# Patient Record
Sex: Female | Born: 1940 | Race: White | Hispanic: No | Smoking: Current every day smoker
Health system: Southern US, Community
[De-identification: ages and names within clinical notes are randomized; demographics above are authoritative.]

## PROBLEM LIST (undated history)

## (undated) DIAGNOSIS — M549 Dorsalgia, unspecified: Secondary | ICD-10-CM

## (undated) DIAGNOSIS — Z8719 Personal history of other diseases of the digestive system: Secondary | ICD-10-CM

## (undated) DIAGNOSIS — J449 Chronic obstructive pulmonary disease, unspecified: Secondary | ICD-10-CM

## (undated) DIAGNOSIS — Z9289 Personal history of other medical treatment: Secondary | ICD-10-CM

## (undated) DIAGNOSIS — K219 Gastro-esophageal reflux disease without esophagitis: Secondary | ICD-10-CM

## (undated) DIAGNOSIS — R296 Repeated falls: Secondary | ICD-10-CM

## (undated) DIAGNOSIS — F32A Depression, unspecified: Secondary | ICD-10-CM

## (undated) DIAGNOSIS — M1990 Unspecified osteoarthritis, unspecified site: Secondary | ICD-10-CM

## (undated) DIAGNOSIS — I639 Cerebral infarction, unspecified: Secondary | ICD-10-CM

## (undated) DIAGNOSIS — J42 Unspecified chronic bronchitis: Secondary | ICD-10-CM

## (undated) DIAGNOSIS — N200 Calculus of kidney: Secondary | ICD-10-CM

## (undated) DIAGNOSIS — G8929 Other chronic pain: Secondary | ICD-10-CM

## (undated) DIAGNOSIS — F329 Major depressive disorder, single episode, unspecified: Secondary | ICD-10-CM

## (undated) HISTORY — PX: BACK SURGERY: SHX140

## (undated) HISTORY — PX: BILATERAL OOPHORECTOMY: SHX1221

## (undated) HISTORY — PX: KIDNEY STONE SURGERY: SHX686

## (undated) HISTORY — PX: HERNIA REPAIR: SHX51

## (undated) HISTORY — PX: GASTRIC BYPASS: SHX52

## (undated) HISTORY — PX: APPENDECTOMY: SHX54

## (undated) HISTORY — PX: CHOLECYSTECTOMY: SHX55

## (undated) HISTORY — PX: CATARACT EXTRACTION W/ INTRAOCULAR LENS  IMPLANT, BILATERAL: SHX1307

## (undated) HISTORY — PX: TONSILLECTOMY: SUR1361

## (undated) HISTORY — PX: EXCISIONAL HEMORRHOIDECTOMY: SHX1541

## (undated) HISTORY — PX: KNEE ARTHROSCOPY: SHX127

---

## 1997-12-14 ENCOUNTER — Ambulatory Visit (HOSPITAL_COMMUNITY): Admission: RE | Admit: 1997-12-14 | Discharge: 1997-12-14 | Payer: Self-pay | Admitting: Neurosurgery

## 1998-11-17 ENCOUNTER — Ambulatory Visit: Admission: RE | Admit: 1998-11-17 | Discharge: 1998-11-17 | Payer: Self-pay | Admitting: Pulmonary Disease

## 1998-12-02 ENCOUNTER — Encounter: Payer: Self-pay | Admitting: Thoracic Surgery

## 1998-12-03 ENCOUNTER — Inpatient Hospital Stay (HOSPITAL_COMMUNITY): Admission: RE | Admit: 1998-12-03 | Discharge: 1998-12-08 | Payer: Self-pay | Admitting: Thoracic Surgery

## 1998-12-04 ENCOUNTER — Encounter: Payer: Self-pay | Admitting: Thoracic Surgery

## 1998-12-06 ENCOUNTER — Encounter: Payer: Self-pay | Admitting: Thoracic Surgery

## 1998-12-07 ENCOUNTER — Encounter: Payer: Self-pay | Admitting: Thoracic Surgery

## 1998-12-08 ENCOUNTER — Encounter: Payer: Self-pay | Admitting: Thoracic Surgery

## 1999-09-08 ENCOUNTER — Encounter: Payer: Self-pay | Admitting: Neurosurgery

## 1999-09-08 ENCOUNTER — Ambulatory Visit (HOSPITAL_COMMUNITY): Admission: RE | Admit: 1999-09-08 | Discharge: 1999-09-09 | Payer: Self-pay | Admitting: Neurosurgery

## 1999-09-22 ENCOUNTER — Ambulatory Visit (HOSPITAL_COMMUNITY): Admission: RE | Admit: 1999-09-22 | Discharge: 1999-09-22 | Payer: Self-pay | Admitting: Neurosurgery

## 1999-09-22 ENCOUNTER — Encounter: Payer: Self-pay | Admitting: Neurosurgery

## 1999-10-06 ENCOUNTER — Ambulatory Visit (HOSPITAL_COMMUNITY): Admission: RE | Admit: 1999-10-06 | Discharge: 1999-10-06 | Payer: Self-pay | Admitting: Neurosurgery

## 1999-10-06 ENCOUNTER — Encounter: Payer: Self-pay | Admitting: Neurosurgery

## 1999-12-29 ENCOUNTER — Encounter: Payer: Self-pay | Admitting: Neurosurgery

## 2000-01-03 ENCOUNTER — Inpatient Hospital Stay (HOSPITAL_COMMUNITY): Admission: RE | Admit: 2000-01-03 | Discharge: 2000-01-11 | Payer: Self-pay | Admitting: Neurosurgery

## 2000-01-03 ENCOUNTER — Encounter: Payer: Self-pay | Admitting: Neurosurgery

## 2000-02-14 ENCOUNTER — Encounter: Admission: RE | Admit: 2000-02-14 | Discharge: 2000-02-14 | Payer: Self-pay | Admitting: Neurosurgery

## 2000-02-14 ENCOUNTER — Encounter: Payer: Self-pay | Admitting: Neurosurgery

## 2000-04-27 ENCOUNTER — Encounter: Admission: RE | Admit: 2000-04-27 | Discharge: 2000-04-27 | Payer: Self-pay | Admitting: Family Medicine

## 2000-05-08 ENCOUNTER — Encounter: Admission: RE | Admit: 2000-05-08 | Discharge: 2000-05-31 | Payer: Self-pay | Admitting: Neurosurgery

## 2000-05-09 ENCOUNTER — Other Ambulatory Visit: Admission: RE | Admit: 2000-05-09 | Discharge: 2000-05-09 | Payer: Self-pay | Admitting: Family Medicine

## 2000-05-10 ENCOUNTER — Encounter: Payer: Self-pay | Admitting: Family Medicine

## 2000-05-10 ENCOUNTER — Encounter: Admission: RE | Admit: 2000-05-10 | Discharge: 2000-05-10 | Payer: Self-pay | Admitting: Family Medicine

## 2001-11-22 ENCOUNTER — Encounter: Payer: Self-pay | Admitting: Anesthesiology

## 2001-11-25 ENCOUNTER — Encounter: Payer: Self-pay | Admitting: Podiatry

## 2001-11-25 ENCOUNTER — Ambulatory Visit (HOSPITAL_COMMUNITY): Admission: RE | Admit: 2001-11-25 | Discharge: 2001-11-25 | Payer: Self-pay | Admitting: Podiatry

## 2002-01-30 ENCOUNTER — Ambulatory Visit (HOSPITAL_COMMUNITY): Admission: RE | Admit: 2002-01-30 | Discharge: 2002-01-30 | Payer: Self-pay | Admitting: Podiatry

## 2003-06-24 ENCOUNTER — Ambulatory Visit (HOSPITAL_COMMUNITY): Admission: RE | Admit: 2003-06-24 | Discharge: 2003-06-24 | Payer: Self-pay | Admitting: Neurosurgery

## 2003-06-24 ENCOUNTER — Encounter: Payer: Self-pay | Admitting: Neurosurgery

## 2003-10-16 ENCOUNTER — Encounter: Admission: RE | Admit: 2003-10-16 | Discharge: 2003-10-16 | Payer: Self-pay | Admitting: Family Medicine

## 2003-12-08 ENCOUNTER — Other Ambulatory Visit: Admission: RE | Admit: 2003-12-08 | Discharge: 2003-12-08 | Payer: Self-pay | Admitting: Obstetrics and Gynecology

## 2004-04-23 ENCOUNTER — Ambulatory Visit (HOSPITAL_COMMUNITY): Admission: RE | Admit: 2004-04-23 | Discharge: 2004-04-23 | Payer: Self-pay | Admitting: Neurosurgery

## 2004-10-07 ENCOUNTER — Ambulatory Visit (HOSPITAL_COMMUNITY): Admission: RE | Admit: 2004-10-07 | Discharge: 2004-10-07 | Payer: Self-pay | Admitting: Neurosurgery

## 2005-03-03 ENCOUNTER — Encounter: Admission: RE | Admit: 2005-03-03 | Discharge: 2005-03-03 | Payer: Self-pay | Admitting: Orthopedic Surgery

## 2005-03-08 ENCOUNTER — Encounter: Admission: RE | Admit: 2005-03-08 | Discharge: 2005-03-08 | Payer: Self-pay | Admitting: Family Medicine

## 2005-05-09 ENCOUNTER — Ambulatory Visit (HOSPITAL_COMMUNITY): Admission: RE | Admit: 2005-05-09 | Discharge: 2005-05-09 | Payer: Self-pay | Admitting: Gastroenterology

## 2005-05-09 ENCOUNTER — Encounter (INDEPENDENT_AMBULATORY_CARE_PROVIDER_SITE_OTHER): Payer: Self-pay | Admitting: *Deleted

## 2005-05-23 ENCOUNTER — Encounter: Admission: RE | Admit: 2005-05-23 | Discharge: 2005-05-23 | Payer: Self-pay | Admitting: Neurosurgery

## 2005-07-05 ENCOUNTER — Encounter: Admission: RE | Admit: 2005-07-05 | Discharge: 2005-07-05 | Payer: Self-pay | Admitting: Neurosurgery

## 2005-07-19 ENCOUNTER — Encounter: Admission: RE | Admit: 2005-07-19 | Discharge: 2005-07-19 | Payer: Self-pay | Admitting: Neurosurgery

## 2005-08-30 ENCOUNTER — Ambulatory Visit (HOSPITAL_COMMUNITY): Admission: RE | Admit: 2005-08-30 | Discharge: 2005-08-30 | Payer: Self-pay | Admitting: Neurosurgery

## 2005-11-19 ENCOUNTER — Ambulatory Visit (HOSPITAL_COMMUNITY): Admission: RE | Admit: 2005-11-19 | Discharge: 2005-11-19 | Payer: Self-pay | Admitting: Neurosurgery

## 2006-07-09 ENCOUNTER — Emergency Department (HOSPITAL_COMMUNITY): Admission: EM | Admit: 2006-07-09 | Discharge: 2006-07-09 | Payer: Self-pay | Admitting: Emergency Medicine

## 2006-10-13 ENCOUNTER — Ambulatory Visit (HOSPITAL_COMMUNITY): Admission: RE | Admit: 2006-10-13 | Discharge: 2006-10-13 | Payer: Self-pay | Admitting: Neurosurgery

## 2006-11-13 ENCOUNTER — Encounter: Admission: RE | Admit: 2006-11-13 | Discharge: 2006-11-13 | Payer: Self-pay | Admitting: Family Medicine

## 2006-12-14 ENCOUNTER — Encounter: Payer: Self-pay | Admitting: Vascular Surgery

## 2006-12-14 ENCOUNTER — Ambulatory Visit: Payer: Self-pay | Admitting: Vascular Surgery

## 2006-12-16 ENCOUNTER — Ambulatory Visit (HOSPITAL_COMMUNITY): Admission: RE | Admit: 2006-12-16 | Discharge: 2006-12-16 | Payer: Self-pay | Admitting: Orthopedic Surgery

## 2007-04-27 ENCOUNTER — Emergency Department (HOSPITAL_COMMUNITY): Admission: EM | Admit: 2007-04-27 | Discharge: 2007-04-27 | Payer: Self-pay | Admitting: Emergency Medicine

## 2007-05-02 ENCOUNTER — Inpatient Hospital Stay (HOSPITAL_COMMUNITY): Admission: RE | Admit: 2007-05-02 | Discharge: 2007-05-07 | Payer: Self-pay | Admitting: Orthopedic Surgery

## 2007-05-03 ENCOUNTER — Ambulatory Visit: Payer: Self-pay | Admitting: Physical Medicine & Rehabilitation

## 2007-05-05 ENCOUNTER — Encounter (INDEPENDENT_AMBULATORY_CARE_PROVIDER_SITE_OTHER): Payer: Self-pay | Admitting: Orthopedic Surgery

## 2007-05-05 ENCOUNTER — Ambulatory Visit: Payer: Self-pay | Admitting: Vascular Surgery

## 2007-05-07 ENCOUNTER — Inpatient Hospital Stay: Admission: AD | Admit: 2007-05-07 | Discharge: 2007-05-21 | Payer: Self-pay | Admitting: Internal Medicine

## 2007-05-09 ENCOUNTER — Encounter: Admission: RE | Admit: 2007-05-09 | Discharge: 2007-05-09 | Payer: Self-pay | Admitting: Orthopedic Surgery

## 2007-05-11 ENCOUNTER — Ambulatory Visit (HOSPITAL_COMMUNITY): Admission: RE | Admit: 2007-05-11 | Discharge: 2007-05-11 | Payer: Self-pay | Admitting: Internal Medicine

## 2007-09-10 ENCOUNTER — Encounter: Admission: RE | Admit: 2007-09-10 | Discharge: 2007-09-10 | Payer: Self-pay | Admitting: Family Medicine

## 2007-12-10 ENCOUNTER — Encounter: Admission: RE | Admit: 2007-12-10 | Discharge: 2007-12-10 | Payer: Self-pay | Admitting: Family Medicine

## 2008-10-14 ENCOUNTER — Encounter: Admission: RE | Admit: 2008-10-14 | Discharge: 2008-10-14 | Payer: Self-pay | Admitting: Family Medicine

## 2008-11-23 ENCOUNTER — Encounter: Admission: RE | Admit: 2008-11-23 | Discharge: 2008-11-23 | Payer: Self-pay | Admitting: Neurosurgery

## 2009-02-16 ENCOUNTER — Encounter: Admission: RE | Admit: 2009-02-16 | Discharge: 2009-02-16 | Payer: Self-pay | Admitting: Neurosurgery

## 2009-02-24 ENCOUNTER — Encounter: Admission: RE | Admit: 2009-02-24 | Discharge: 2009-02-24 | Payer: Self-pay | Admitting: Family Medicine

## 2009-06-15 ENCOUNTER — Encounter: Admission: RE | Admit: 2009-06-15 | Discharge: 2009-06-15 | Payer: Self-pay | Admitting: Neurosurgery

## 2009-08-24 ENCOUNTER — Encounter: Admission: RE | Admit: 2009-08-24 | Discharge: 2009-08-24 | Payer: Self-pay | Admitting: Neurosurgery

## 2009-11-24 ENCOUNTER — Encounter: Admission: RE | Admit: 2009-11-24 | Discharge: 2009-11-24 | Payer: Self-pay | Admitting: Neurosurgery

## 2010-01-25 ENCOUNTER — Inpatient Hospital Stay (HOSPITAL_COMMUNITY): Admission: RE | Admit: 2010-01-25 | Discharge: 2010-01-29 | Payer: Self-pay | Admitting: Neurosurgery

## 2010-02-07 ENCOUNTER — Encounter: Payer: Self-pay | Admitting: Family Medicine

## 2010-03-23 ENCOUNTER — Encounter: Admission: RE | Admit: 2010-03-23 | Discharge: 2010-03-23 | Payer: Self-pay | Admitting: Family Medicine

## 2010-09-25 ENCOUNTER — Encounter: Payer: Self-pay | Admitting: Neurosurgery

## 2010-10-04 NOTE — Letter (Signed)
Summary: [facility]  [facility]   Imported By: Maryln Gottron 02/23/2010 13:07:43  _____________________________________________________________________  External Attachment:    Type:   Image     Comment:   External Document

## 2010-11-21 LAB — DIFFERENTIAL
Basophils Absolute: 0 10*3/uL (ref 0.0–0.1)
Basophils Relative: 0 % (ref 0–1)
Eosinophils Absolute: 0.4 10*3/uL (ref 0.0–0.7)
Eosinophils Relative: 5 % (ref 0–5)
Lymphocytes Relative: 37 % (ref 12–46)
Lymphs Abs: 3.2 10*3/uL (ref 0.7–4.0)
Monocytes Absolute: 0.5 10*3/uL (ref 0.1–1.0)
Monocytes Relative: 6 % (ref 3–12)
Neutro Abs: 4.4 10*3/uL (ref 1.7–7.7)
Neutrophils Relative %: 51 % (ref 43–77)

## 2010-11-21 LAB — PROTIME-INR
INR: 0.94 (ref 0.00–1.49)
Prothrombin Time: 12.5 seconds (ref 11.6–15.2)

## 2010-11-21 LAB — MRSA PCR SCREENING

## 2010-11-21 LAB — COMPREHENSIVE METABOLIC PANEL
ALT: 26 U/L (ref 0–35)
AST: 34 U/L (ref 0–37)
Albumin: 3.8 g/dL (ref 3.5–5.2)
Alkaline Phosphatase: 73 U/L (ref 39–117)
BUN: 9 mg/dL (ref 6–23)
CO2: 24 mEq/L (ref 19–32)
Calcium: 9.1 mg/dL (ref 8.4–10.5)
Chloride: 107 mEq/L (ref 96–112)
Creatinine, Ser: 0.51 mg/dL (ref 0.4–1.2)
GFR calc non Af Amer: >60 mL/min (ref >60)
Glucose, Bld: 95 mg/dL (ref 70–99)
Potassium: 4.4 mEq/L (ref 3.5–5.1)
Sodium: 136 mEq/L (ref 135–145)
Total Bilirubin: 0.5 mg/dL (ref 0.3–1.2)
Total Protein: 6.4 g/dL (ref 6.0–8.3)

## 2010-11-21 LAB — SURGICAL PCR SCREEN: MRSA, PCR: NEGATIVE

## 2010-11-21 LAB — URINALYSIS, ROUTINE W REFLEX MICROSCOPIC
Bilirubin Urine: NEGATIVE
Glucose, UA: NEGATIVE mg/dL
Hgb urine dipstick: NEGATIVE
Ketones, ur: NEGATIVE mg/dL
Nitrite: NEGATIVE
Protein, ur: NEGATIVE mg/dL
Specific Gravity, Urine: 1.02 (ref 1.005–1.030)
Urobilinogen, UA: 0.2 mg/dL (ref 0.0–1.0)
pH: 5.5 (ref 5.0–8.0)

## 2010-11-21 LAB — APTT: aPTT: 25 seconds (ref 24–37)

## 2010-11-21 LAB — CBC
HCT: 38.4 % (ref 36.0–46.0)
Hemoglobin: 13 g/dL (ref 12.0–15.0)
MCHC: 33.9 g/dL (ref 30.0–36.0)
MCV: 97.2 fL (ref 78.0–100.0)
Platelets: 199 10*3/uL (ref 150–400)
RBC: 3.95 MIL/uL (ref 3.87–5.11)
RDW: 13.1 % (ref 11.5–15.5)
WBC: 8.6 10*3/uL (ref 4.0–10.5)

## 2010-11-21 LAB — TYPE AND SCREEN
ABO/RH(D): O POS
Antibody Screen: NEGATIVE

## 2010-11-21 LAB — ABO/RH: ABO/RH(D): O POS

## 2011-01-17 NOTE — Discharge Summary (Signed)
NAME:  Meagan Holt, Meagan Holt NO.:  [id number]   MEDICAL RECORD NO.:  [id number]          PATIENT TYPE:  INP   LOCATION:  1614                         FACILITY:  [facility]   PHYSICIAN:  Georges Lynch. Gioffre, M.D.DATE OF BIRTH:  01-May-1941   DATE OF ADMISSION:  05/02/2007  DATE OF DISCHARGE:  05/07/2007                               DISCHARGE SUMMARY   ADMISSION DIAGNOSES:  1. End-stage osteoarthritis, left knee.  2. History of chronic bronchitis, right lung lobectomy.  3. Hypertension with a recent normal stress test.  4. History of hiatal hernia, bleeding ulcers.  5. History of multiple kidney stones.  6. History of peripheral neuropathies.  7. Obesity.   DISCHARGE DIAGNOSES:  1. Left total knee arthroplasty with slow postoperative progression.  2. Postoperative temperatures, etiology unknown, possible early      pneumonia, improved on Avelox.  3. Postoperative urinary retention, improved with Urecholine.  4. Postoperative slow progression with therapy.  5. Postoperative acute blood loss anemia tolerated well without blood      transfusion.  6. History of chronic bronchitis with right lung lobectomy.  7. Hypertension, recent normal stress test.  8. History of hiatal hernia, bleeding ulcers.  9. History of multiple kidney stones.  10.History of peripheral neuropathies.  11.Obesity.  12.History of tobacco use.   SURGICAL PROCEDURE:  On 05/02/2007, the patient was taken to the O.R. by  Dr. Ranee Gosselin, assisted by Oneida Alar, P.A.-C.  The patient underwent  left total knee arthroplasty with a Dupuy rotating platform system  without any complications.  The patient tolerated the procedure well.  There were no complications.  The patient was transferred to the  recovery room and then to the Orthopedic Floor in good condition  following routine total knee protocol, IV antibiotics, pain medicines  and DVT prophylaxis.  The patient had the following components  implanted.   A size 4 left femoral component, a size 4 keel tibial tray,  size 4, 12.5 polyethylene bearing, a size 38-mm 3-peg patella.  All  components were implanted with polymethyl methacrylate and vancomycin  mixed in.   CONSULTS:  The following routine consults were required.  Physical  Therapy, Case Management, Physical Therapy, Pharmacy for Coumadin  dosing.   An [facility] Hospitalist Consult was also requested for postoperative  temperature.   HOSPITAL COURSE:  On May 02, 2007, patient was admitted to [facility] under the care of Dr. Darrelyn Hillock.  The patient was taken to  the O.R. where a left total knee arthroplasty was performed.  The  patient tolerated the procedure well.  There were no complications.  The  patient was transferred to the remainder and then to the Orthopedic  Floor in good condition.  The patient was able to follow a total knee  protocol.  She was also weaned off of IV antibiotics, pain medicines,  the p.o. meds.  She tolerated them well.  The patient did develop some  postoperative temperatures with temperatures in the 104.3 range.  She  also had some shortness of breath.  She was worked up and evaluated by  [employer]  Hospitalist.  She was placed on Avelox.  Chest x-rays were  performed.  Chest x-ray on August 30 showed improved interval aeration  of the lung bases, small right pleural effusions, maybe slightly larger  than previous chest x-ray, which showed large interstitial markings,  most consistent with edema.  The patient's temperature did resolve over  the next course of 24-48 hours.  She continued to improve without any  signs of an other signs of infection.  She had no other further  shortness of breath.  Patient was having difficulty timed voiding after  DC'g her Foley catheter so she was placed on Urecholine with some  improvement.  The patient did develop some postoperative blood loss  anemia.  Patient's vital signs remained stable.  She  tolerated it well,  so no blood transfusion was performed.  Patient did complain of pain and  difficulty with any attempts at physical therapy, and she made slow  progress with the therapy and CPM.   Patient's left knee did have a typical diffuse violaceous and erythremic  appearance.  It is felt to be related to the Coumadin and the  postoperative bleeding and to the knee, but she will be placed on Keflex  prophylactically in the face of the intermittent temperatures  throughout.  No gross signs of infection were noted.  Patient's INR was  2.1.  Urine cultures showed no growth.  Blood cultures showed no growth  associated.  No specific source of infection.   It was felt that on postop day number 5, she was Orthopedically and  medically stable from Dr. Jeannetta Ellis standpoint for discharge to skilled  nursing facility, so arrangements were made and she will be discharged  when a bed became available.   LABS:  Blood cultures drawn on August 29 had no growth to date.  Urine  culture drawn on August 29 shows no growth, final.  CBC on September 2  shows WBC 7.6, hemoglobin 8.6, hematocrit 25.8, platelets 305.  BMET  drawn on September 2 shows sodium of 137, potassium of 3.9, glucose 94,  BUN 5, creatinine 0.62 with an EGFR of greater than 60.  PT was 24.2  with an INR of 2.1.  Chest x-ray, preop, shows scarring of right with  small pleural effusion versus pleural scarring.  Chest x-ray done on  August 29 shows enlargement of the interstitial markings mostly  consistent with edema.  Chest x-ray on August 30 shows improved  interval, improved aeration of lung bases, small right pleural effusion,  maybe slightly larger.  EKG on April 09, 2007 shows sinus tachycardia at  114.   DISCHARGE INSTRUCTIONS:  1. Diet.  No restrictions.  2. Activity.  Patient is to be weight bearing as tolerated, increased      activity on a daily basis with the use of walker.  Patient is to be      out of bed with  physical therapy on a daily basis.  Patient is to      be in a CPM at least 6 hours a day, 0-40 degrees to start,      increasing by 10 degrees a day.  3. Wound care.  Patient should have her dressing changed on a daily      basis.  Staples are to remove on postop day number 14.  Today is      day number 5.  Please apply Steri-Strips after staples are removed.   MEDICATIONS:  1. Neurontin 600 mg p.o. t.i.d.  2. Colace 100 mg p.o. b.i.d.  3. Ferrous sulfate 325 mg p.o. t.i.d.  4. Combivent inhaler t.i.d.  5. Urecholine 25 mg p.o. q.i.d. until urinating freely.  6. Nystatin cream to be applied to affected area 3 times a day.  7. Avapro 150 mg daily, but to hold for blood pressures less than a      systolic of 110 or diastolic less than 50.  8. Albuterol 90 mcg inhaler 2 puffs every 6 hours p.r.n.  9. Flexeril 10 mg every 12 hours.  10.Percocet 5 mg 1 or 2 tablets every 4-6 hours p.r.n.  11.Tylenol 650 p.o. q.4 hours p.r.n.  12.Phenergan 25 mg p.o. q.6 hours p.r.n.  13.Robaxin 500 mg p.o. q.6 hours p.r.n.  14.Ambien 5 mg p.o. nightly p.r.n.  15.Coumadin 7.5 mg p.o. q.day to maintain an INR of 2-2.5.  Pharmacy      to adjust accordingly.  16.Avelox 400 mg p.o. q.day for 2 more days.  17.Keflex 500 mg p.o. q.i.d. for 7 days.   Follow-up appointment with Dr. Darrelyn Hillock 2 weeks from discharge from  [facility].  Please call [phone] for follow-up appointment.   Patient's condition upon discharge, skilled nursing facility is  improved.      Jamelle Rushing, P.A.    ______________________________  Georges Lynch Darrelyn Hillock, M.D.    RWK/MEDQ  D:  05/07/2007  T:  05/07/2007  Job:  [id number]

## 2011-01-17 NOTE — H&P (Signed)
NAME:  Meagan Holt, BIDWELL NO.:  [id number]   MEDICAL RECORD NO.:  [id number]         PATIENT TYPE:  LINP   LOCATION:                               FACILITY:  [facility]   PHYSICIAN:  Georges Lynch. Gioffre, M.D.DATE OF BIRTH:  03/17/1941   DATE OF ADMISSION:  05/02/2007  DATE OF DISCHARGE:                              HISTORY & PHYSICAL   CHIEF COMPLAINT:  Severe pain in the left knee.   HISTORY OF PRESENT ILLNESS:  Ms. Meagan Holt is a 70 year old female patient  of Dr. Ranee Gosselin with chronic left knee pain.  The patient has  failed conservative treatment.  She has pain with range of motion and  ambulation.  She has noted some valgus deformity of the knee.  X-ray  shows that she is bone-on-bone lateral compartment with severe  osteoarthritis.  The patient has elected to proceed with a total knee  arthroplasty.   PAST MEDICAL HISTORY:  1. 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  2. Tobacco use.  3. 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  4. Hypertension.  5. Recent stress test 3 years previous, normal.  6. History of hiatal hernia.  7. History of bleeding ulcers 1 year previous with blood transfusion.  8. History of multiple kidney stones.  9. History of peripheral neuropathy status post lumbar surgery.  10.Obesity.   ALLERGIES:  1. LATEX.  2. DOXYCYCLINE.   MEDICATIONS:  1. Diovan/HCT 160/12.5 mg a day.  2. Neurontin 300 mg 2 tablets three times a day.  3. Flexeril 10 mg p.r.n.  4. Triamcinolone 0.25 mg p.r.n.  5. Albuterol 90 mcg 2 puffs p.r.n.  6. Percocet 5 mg 1-2 tablets every 6 hours p.r.n.  7. Fish oil.  8. Ocuvite.  9. Lutein.   PAST SURGICAL HISTORY:  1. Tonsillectomy in 1946.  2. Appendectomy in 1954.  3. Cholecystectomy in 1974.  4. IJ bypass in 1976 with a reversal and 1981 and stomach stapling.  5. Multiple kidney stones in 1978.  6. Lumbar surgery in 2000.  7. Right lung lobectomy in 2005.  The  patient denies any complications of any of the above-mentioned  surgical procedures.   REVIEW OF SYSTEMS:  Negative for any neurologic issues other than the  peripheral neuropathy which resulted after lumbar surgery.  Chronic  bronchitis due to smoking since 2005; intermittent nebulizer use.  No  shortness of breath secondary to previous lobectomy.  A recent stress  test 3 years previous due to chest pain was found to be related to a  hiatal hernia.  Normal stress test.  Bleeding ulcers 1 year previous  with blood transfusions.  Multiple kidney stones in 1978 after IJ  bypass.  No recent urinary tract infections or incontinence.  She denies  anything related to any endocrine issues.  No thyroid issues or  diabetes.  Blood transfusion secondary to bleeding ulcer 1 year  previous.  No other blood clots or cancers.   FAMILY MEDICAL HISTORY:  Mother is deceased at age 7; no medical  issues.  Father is deceased  in his 75s room secondary to stomach cancer.  Sisters have heart disease and hypertension.  Grandfather was deceased  from a stroke.   SOCIAL HISTORY:  Patient is a widow.  Retired Airline pilot.  She smokes  about a pack a day.  She drinks alcohol socially.  She lives in a one-  story house with a handicap ramp.  The patient will not have help  postoperatively, and will probably need a skilled nursing facility.   PHYSICAL EXAMINATION:  VITAL SIGNS:  Height is 5 feet, 8 inches.  Weight  is 240.  Blood pressure is 128/74, pulse of 74 and regular, respirations  are 14 and nonlabored.  The patient is afebrile.  HEENT:  Head was normocephalic.  Pupils good range of motion.  Gross  hearing is intact.  Oral buccal mucosa was pink.  NECK:  Supple.  No palpable lymphadenopathy.  Good range of motion  without any discomfort.  No thyroid tenderness.  CHEST:  Lung sounds were clear throughout.  She did have some chest wall  incisions that were well-healed.  HEART:  Regular rate and rhythm.  No  murmurs, rubs or gallops.  ABDOMEN:  Obese, soft, nontender.  Bowel sounds present.  EXTREMITIES:  Upper extremities were symmetric in size and shape.  Good  range of motion of the shoulders, elbows and wrists.  Lower extremities  - right and left hip had full extension, flexion up to 120 degrees with  20-30 degrees internal and external rotation without any discomfort.  Right knee had full extension.  Flexion of the back to 120 degrees.  No  instability.  She had no effusion.  Calf was soft and nontender.  Right  knee had very painful range of motion with about 10 degrees short of  full extension back to about 95 degrees.  She did have a varus valgus  deformity.  Calf was soft and nontender.  Ankles were symmetrical with  good plantar flexion.  NEUROLOGIC:       The patient was conscious, alert and appropriate, good  historian.  She did have some generalized discomfort in the bilateral  lower extremities.  She was grossly intact to light touch sensation.  She does have a slight essential-type tremor.  PERIPHERAL VASCULAR:  Carotid pulses were 2+, no bruits.  Radial pulses  were 2+.  Dorsalis pedis pulses were 1+.  She did have some trace lower  extremity edema and varicosities throughout.  BREAST/RECTAL/GU:  Deferred at this time.   IMPRESSION:  1. End-stage osteoarthritis, left knee.  2. History of chronic bronchitis with right lung lobectomy.  3. Hypertension with a recent stress test normal.  4. History of hiatal hernia and bleeding ulcers with blood      transfusion.  5. History of multiple kidney stones.  6. History of peripheral neuropathies bilateral lower extremities.  7. Obesity.   PLAN:  The patient will undergo all routine labs and tests prior to  having a left total knee arthroplasty by Dr. Darrelyn Hillock at [facility] on May 02, 2007.  The patient will undergo all routine labs  and tests.  The patient has been evaluated by Dr. Caryl Never, her primary  care  physician at [facility] and has been cleared for  this surgical procedure.  This preoperative evaluation has been  forwarded to the hospital their evaluation.      Jamelle Rushing, P.A.    ______________________________  Georges Lynch Darrelyn Hillock, M.D.    RWK/MEDQ  D:  04/19/2007  T:  04/19/2007  Job:  [id number]

## 2011-01-17 NOTE — Op Note (Signed)
NAME:  Meagan Holt, KUPER NO.:  [id number]   MEDICAL RECORD NO.:  [id number]          PATIENT TYPE:  INP   LOCATION:  1614                         [facility]ACILITY:  [facility]   PHYSICIAN:  Georges Lynch. Gioffre, M.D.DATE OF BIRTH:  09-28-1940   DATE OF PROCEDURE:  05/02/2007  DATE OF DISCHARGE:                               OPERATIVE REPORT   SURGEON:  Georges Lynch. Darrelyn Hillock, M.D.   ASSISTANT:  Jamelle Rushing, P.A.   PREOPERATIVE DIAGNOSIS:  Severe degenerative arthritis of the left knee.   POSTOPERATIVE DIAGNOSIS:  Severe degenerative arthritis of the left  knee.   OPERATION:  Left total knee arthroplasty utilizing the DePuy system.  I  cemented all three components.  I used VANCOMYCIN in the cement.  The  sizes used were as follows:  I used a size 4rotating platform 12.5 mm  thick.  The tibial tray was a size 4.  The patella was a size 38 mm  three-pegged.  The femur was a size 4left posterior cruciate-sacrificing  femoral component.   PROCEDURE:  Under general anesthesia, routine orthopedic prep and drape  of the left lower extremity was carried out.  At this time the patient  had 2 g of IV Ancef.  The leg was exsanguinated with an Esmarch and the  tourniquet was elevated at 400 mmHg.  An incision was made over the  anterior aspect of the left knee with the knee flexed.  The knee then  was extended and two flaps were created.  I then carried out a median  parapatellar approach and reflected the patella laterally.  I then  flexed the knee and excised the anterior and posterior cruciate  ligaments.  I then excised the lateral and medial menisci.  Once this  was done, we debrided all the local spurs.  Following that, initial  drill hole was made in the intercondylar notch and the femoral guide rod  was inserted.  We removed 12 mm thickness off the distal femur.  Following that we then sized the femur for a size 4.  We carried out our  initial anterior-posterior and chamfering cuts  in the femur for a size 4  left femoral component.  Following that we prepared the tibia.  We  removed approximately 4 mm thickness off of the affected side of the  tibia in the usual fashion.  Following the tibial cut we then inserted  our spacers and we had excellent alignment with our spacers for our  flexion/extension measurements.  Following that we then went ahead and  cut our keel cut out of the tibia in the usual fashion.  The tibia  measured a size 4.  Following that we went up and cut our notch cut out  of the femur in the usual fashion.  We then inserted our trial  components, went through range of motion with first a 10-mm thickness  insert.  We finally selected a 12-mm thickness insert.  Following that  we then did a resurfacing procedure on the patella for a size 38 patella  in the usual fashion.  Three drill holes  were made in the patella  articular surface.  We then thoroughly water-picked out the knee, dried  the knee out and cemented all three components in simultaneously.  After  the cement was hardened we searched for loose pieces of cement, removed  all loose pieces of cement.  We water-picked the knee out as well.  Following that we then went through our trials again with our tibial  insert.  We first tried a size 10, then finally selected a size 12-mm  thickness insert, which is extremely stable.  We removed the trial,  water-picked the knee out again, dried the knee out and then inserted  our permanent size 4, 12.5-mm thickness tibial insert, which was a  rotating-platform insert.  The knee was reduced, taken through motion.  We had excellent  function.  We then injected 12 mL of 0.5% Marcaine with Toradol into the  wound site.  Following that we used 10 mL of FloSeal and then closed the  wound in layers in the usual fashion.  No drain was used.  Skin was  closed with metal staples.  A sterile Neosporin dressing was applied.            ______________________________  Georges Lynch Darrelyn Hillock, M.D.     RAG/MEDQ  D:  05/02/2007  T:  05/03/2007  Job:  [id number]

## 2011-01-17 NOTE — Consult Note (Signed)
NAME:  Meagan Holt, Meagan Holt NO.:  [id number]   MEDICAL RECORD NO.:  [id number]          PATIENT TYPE:  INP   LOCATION:  1614                         [facility]ACILITY:  [facility]   PHYSICIAN:  Lonia Blood, M.D.      DATE OF BIRTH:  [birth year]   DATE OF CONSULTATION:  05/02/2007  DATE OF DISCHARGE:                                 CONSULTATION   REQUESTING PHYSICIAN:  Dr. Darrelyn Hillock, Orthopedic Surgery.   PRIMARY CARE PHYSICIAN:  Dr. Evelena Peat.   REASON FOR CONSULT:  Fever and shortness of breath.   HISTORY OF PRESENT ILLNESS:  The patient is a 70 year old female  admitted with end-stage osteoarthritis of the left knee.  She is status  post left knee replacement status.  The patient has done well until this  morning, when she was found to have a fever of 104.3.  She also had  shortness of breath.  Initial evaluation did not show any obvious signs.  Her urinalysis and chest x-rays were unrevealing.  We are being  consulted for further workup.  The patient is stable, communicating  without any obvious distress.  Denied any chest pain.  Denied any cough.   PAST MEDICAL HISTORY:  1. Hypertension.  2. Chronic bronchitis with chronic interstitial lung disease, status      post right lung lobectomy from a hamartoma.  3. History of GERD.  4. history of GI bleed recently.  5. History of kidney stones, recurrent.  6. History of peripheral neuropathy with lumbar surgeries.  7. Obesity.   ALLERGIES:  She is allergic to LASIX and DOXYCYCLINE.   MEDICATIONS:  1. Diovan HCT 160/12.5 mg.  2. Neurontin 300 mg two-tablet dose t.i.d.  3. Flexeril 10 mg p.r.n.  4. Triamcinolone cream 0.5 mg as needed.  5. Albuterol 90 mcg; she takes two puffs p.r.n.  6. Percocet 5 mg one to two tablets q.6 h. p.r.n.   SOCIAL HISTORY:  She smokes about 1-1/2 packs per day.  Social drinker.  She is widowed and retired Airline pilot.   FAMILY HISTORY:  Her mother died at the age of 58 with no medical   issues.  Father died in his 8s from stomach cancer.  Family history of  heart disease and hypertension.   REVIEW OF SYSTEMS:  Twelve-point review of systems is essentially  negative except per HPI.   PHYSICAL EXAMINATION:  VITAL SIGNS:  On exam, temperature max 104.3,  blood pressure 112/53, pulse 121, respiratory rate 24, SATs 92% on 3 L.  GENERAL:  She is awake, alert, pleasant, in no acute distress.  HEENT:  PERRL.  EOMI.  NECK:  Supple.  No JVD.  No lymphadenopathy.  RESPIRATORY:  She has good air entry bilaterally on both sides with fine  crackles.  No rhonchi, no wheezes.  CARDIOVASCULAR:  Tachycardiac.  ABDOMEN:  Obese, soft and nontender with positive bowel sounds.  EXTREMITIES:  No edema, cyanosis or clubbing.   LABORATORY DATA:  Urinalysis essentially negative.  Sodium 138,  potassium 3.8, chloride 101, CO2 26, glucose 105, BUN 5, creatinine  0.57, calcium 8.5.  White count  is 14.4, hemoglobin 10.7, platelets  323,000.   ASSESSMENT:  This is a 70 year old female, status post left total knee  replacement, presenting with shortness of breath and fever.  The  differentials are varied.  Pneumonia is certainly likely, even though  chest x-ray only showed fluid and the chest x-ray showed no evidence of  pneumonia, but could be early.  Other possibilities include infection of  her joints which is not obvious.  It could also be just inflammation.  Other problems include chronic bronchitis and chronic interstitial lung  disease, which may account for the shortness of breath.  The patient  also has hypertension and postoperative anemia; her hemoglobin was 12.2  on admission, currently down to 10.7.   RECOMMENDATIONS:  Would agree with blood cultures x2.  Empiric  antibiotic treatment.  In this case, we will use something like Levaquin  with some broad-spectrum coverage until cultures come back.  We will  continue with pulmonary toileting, especially convert the patient to   nebulized bronchodilators while in the hospital.  Tobacco cessation  counseling has been given.  Continue with her home medications  otherwise.  Repeat chest x-ray in the morning to see if there will be  pneumonia or any infiltrates.  I will be glad to follow up with you.      Lonia Blood, M.D.  Electronically Signed     LG/MEDQ  D:  05/03/2007  T:  05/04/2007  Job:  [id number]

## 2011-01-20 NOTE — H&P (Signed)
[place]. [facility]  Patient:    Meagan Holt, Meagan Holt                      MRN: [id number] Adm. 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  HISTORY OF PRESENT ILLNESS:  This is a now 70 year old right-handed white girl who I have been seeing since 1994 with back and leg pain.  She fell in 1989 and has had back pain ever since then.   When I saw her in 1994, we managed her conservatively.  She did physical therapy and was really unchanged.  I did not see her again until December 2000 when she was having more back pain.  An MRI was obtained that showed spondylitic change at 3-4, 4-5, and 5-1 with stenosis.  She had right leg pain with full strength.  She was tried on epidural steroids but did not get much better, and now she is at the point where she cannot do most of the things she wants to do because of her back and leg pain and is taking an excessive amount of pain medicine.  She is now admitted for lumbar fusion at 3-4, 4-5, and 5-1.  PAST MEDICAL HISTORY:  Remarkable for hiatal hernia and hypertension.  She has had tonsillectomy, adenoidectomy, and appendectomy, cholecystectomy, BSO, an ileojejunal bypass with reversal and stomach stapling, umbilical hernia repair, and right upper lobe wedge resection of the lung.  ALLERGIES:  LATEX.  MEDICATIONS: 1. Diovan 160 at 12-1/2 a day. 2. Celebrex 200 b.i.d. 3. Vicodin p.r.n. 4. Vitamin B12. 5. OPC-3. 6. Caltrate with vitamin D every day.  REVIEW OF SYSTEMS:  Remarkable for back and leg pain and nervousness.  FAMILY HISTORY:  Daddy died of cancer at 69.  Mom is alive and in good health at 95.  PHYSICAL EXAMINATION:  HEENT:  Within normal limits.  NECK:  She has reasonable range of motion of her neck.  CHEST:  Clear.  CARDIAC:  Regular rate and rhythm.  ABDOMEN: Nontender with no  hepatosplenomegaly.  EXTREMITIES:  Without clubbing or cyanosis.  GU:  Exam deferred.  PERIPHERAL PULSES:  Good.  NEUROLOGIC:  She is awake, alert, and oriented.  Cranial nerves are intact. Motor exam shows 5/5 strength throughout the upper and lower extremities save for the dorsiflexors of the right foot which are 4/5.  She has no sensory deficit.  Reflexes are absent from the knees and ankles.  Toes are indifferent.  Standing with flexion and extension causes her low back pain, and prolonged standing causes her leg weakness and numbness.  She can walk about 50 feet before her back and legs hurt.  CLINICAL IMPRESSION:  Lumbar spondylosis with neurogenic claudication.  PLAN:  Lumbar laminectomy, diskectomy, posterior interbody fusion at 3-4, 4-5, and 5-1.  The risks and benefits of this approach have been discussed extensively with her, and she wishes to proceed. DD:  01/03/00 TD:  01/03/00 Job: [id number]

## 2011-01-20 NOTE — Op Note (Signed)
[place]. [facility]  Patient:    Meagan Holt, Meagan Holt                      MRN: [id number] Proc. Date: 01/03/00 Adm. Date:  [id number] Attending:  Emeterio Reeve                           Operative Report  PREOPERATIVE DIAGNOSIS:  Severe spondylosis at L3-4, L4-5, and L5-S1.  POSTOPERATIVE DIAGNOSIS:  Severe spondylosis at L3-4, L4-5, and L5-S1.  OPERATION:  L3-L4, L4-L5, and L5-S1 laminectomy and diskectomy, posterior lumbar antibody fusion with Ray cage fusion cage.  SURGEON:  Payton Doughty, M.D.  ANESTHESIA:  General endotracheal.  PREP:  Sterile Betadine and prepped and draped with alcohol wipe.  COMPLICATIONS:  None.  INDICATION FOR PROCEDURE:    A 70 year old right handed white lady with severe spondylosis at 3-4, 4-5 and 5-1 in the lumbar spine.  DESCRIPTION OF PROCEDURE:  She was taken to the operating room, anesthetized and intubated and placed prone on the operating table.  Shaved, prepped and draped in the usual sterile fashion.  Skin was infiltrated with 1% Lidocaine with 1:400,000 epinephrine. Skin incision was made from mid S1 to the bottom of L2. The lamina of L3, L4 and L5 were exposed bilaterally in the subperiosteal plane out over the facet joints.  Self retaining retractor was placed.  The intraoperative x-ray was taken to confirm correctness of level. The pars interarticularis inferior facet and lamina of L3-L4, L4-L5 and the superior facet of L4-L5 and S1 were removed bilaterally and the bone set aside for grafting. A L3-4 there was severe bilateral recess narrowing secondary to herniated disc and osteophytic intrusion on to the 3 and 4 roots.  At 4-5 there was similar lateral recess narrowing but with very profound facet hypertrophy.  At 5-1 the major pathology appeared to be facet hypertrophy causing lateral recess narrowing.  Following completion of diskectomies and decompression of all the nerve roots Ray fusion cages were  placed bilaterally.  These cages were 12 x 21 mm. Intraoperative x-rays showed good placement of the cages. They were packed with bone graft harvest from the facet joints and capped. The wound was irrigated.  Hemostasis was assured. The fascia was reapproximated with 0 Vicryl in an interrupted fashion. Subcutaneous tissue was reapproximated with 0 Vicryl in interrupted fashion. Subcuticular tissue was reapproximated with 3-0 Vicryl in interrupted fashion.  The skin was closed with 3-0 nylon in a running locked fashion.  A Betadine and Telfa dressing was applied and made occlusive with Op-Site. The patient was then returned to the recovery room in good condition. DD:  01/03/00 TD:  01/05/00 Job: [id number]

## 2011-01-20 NOTE — Op Note (Signed)
[facility]  Patient:    Meagan Holt, Meagan Holt Visit Number: [id number] MRN: [id number]          Service Type: DSU Location: DAY Attending Physician:  Abran Richard Dictated by:   Oley Balm Pricilla Holm, D.P.M. Proc. Date: 11/25/01 Admit Date:  11/25/2001                             Operative Report  PREOPERATIVE DIAGNOSIS:  Hallux valgus deformity, right foot.  POSTOPERATIVE DIAGNOSIS:  Hallux valgus deformity, right foot.  OPERATION:  Austin bunionectomy, right foot.  SURGEON:  Oley Balm. Pricilla Holm, D.P.M.  ANESTHESIA:  Local standby.  INDICATIONS:  Longstanding history of pain unrelieved by conservative care. Inability to wear closed shoes comfortably.  DESCRIPTION OF PROCEDURE:  The patient was brought into the operating room and placed on the operating table in the supine position.  The patients lower left foot and leg were prepped and draped in the usual aseptic manner.  Then with an ankle tourniquet placed and well padded to prevent contusion and elevated to 250 mmHg, exsanguination of the left foot, the following surgical procedure was then performed under local standby anesthesia and under local infiltrate of 2% Xylocaine and 0.5% Marcaine.  Austin bunionectomy, right foot:  Attention was directed to the dorsal and medial aspect of the right MTP where a curvilinear incision was made.  The incision line was deepened via sharp and blunt dissection making sure to identify and retract all vital structures.  Capsular incisions were then made and the head of metatarsal freed of all soft tissue attachments, dorsally, medially, laterally and plantarly.  Then utilizing a Zimmer oscillating saw, medial remnants on medial aspect of the first metatarsal head was resected. Dissection was then carried down deep in the first web space where a lateral fibular sesamoid release was performed.  Attention was redirected to the medial aspect of the first metatarsal head  where an Eliberto Ivory type osteotomy was made.  The apex distal, the base proximal, the capital fragments slid laterally, impacted and fixated with two 0.45 K-wires.  After fixation, it was noted that the osteotomy site was stable.  The remainder protruding aspect of the metatarsal resected.  All rough edges were rasped smooth.  The wound was lavaged of copious amounts of sterile saline and the capsule and subcutaneous tissues reapproximated with continuous suture of 4-0 Dexon and skin was reapproximated utilizing a running subcuticular suture of 4-0 Dexon.  All surgical sites were infiltrated with approximately 0.8 cc of Dexamethasone phosphate.  Compressive dressings consisting of Betadine soaked in Adaptic, sterile 4 x 4s and sterile Kling was then applied.  The patient tolerated the procedure well and left the operating room in apparent good condition.  Vital signs were stable to recovery room. Dictated by:   Oley Balm Pricilla Holm, D.P.M. Attending Physician:  Abran Richard DD:  11/25/01 TD:  11/26/01 Job: [id number]

## 2011-01-20 NOTE — Discharge Summary (Signed)
[place]. [facility]  Patient:    Meagan Holt, Meagan Holt                        MRN: [id number] Adm. Date:  01/03/00 Disc. Date: 01/11/00 Attending:  Payton Doughty, M.D.                           Discharge Summary  ADMISSION DIAGNOSIS:  Lumbar spondylosis of L3-4, L4-5, and L5-S1.  DISCHARGE DIAGNOSIS:  Lumbar spondylosis of L3-4, L4-5, and L5-S1.  OPERATIONS AND PROCEDURES:  L3-4, L4-5, and L5-S1 laminectomy and diskectomy and posterior interbody fusion with right-sided fusion cage.  COMPLICATIONS:  None.  DISCHARGE STATUS:  Alive and well.  HISTORY OF PRESENT ILLNESS:  A 70 year old, right-handed, white lady, whose history and physical is recounted in the chart.  She had severe spondylosis at L3-4, L4-5, and L5-S1.  Discography was positive at those levels and she was admitted for fusion.  PAST MEDICAL HISTORY:  Remarkable for hiatal hernia and hypertension.  PAST SURGICAL HISTORY:  Tonsillectomy, adenoidectomy, appendectomy, cholecystectomy, ileojejunal bypass, reversal of stomach stapling, umbilical hernia repair, right upper lobe wedge resection of the lungs.  MEDICATIONS:  Diovan, Celebrex, Vicodin, vitamin B12, OPC3, Caltrate.  PHYSICAL EXAMINATION:  The general exam was remarkable for obesity.  The neurologic exam was intact.  She had limited range of motion of her back. Positive straight leg raise.  HOSPITAL COURSE:  She was admitted after ascertainment of normal laboratory values and underwent a three-level fusion.  Postoperatively, she did relatively well.  She had severe spondylosis at all levels with bilateral lateral recess narrowing.  She was somewhat slow to mobilize postoperatively owing to her size and also to the size of the operation.  She was seen by the rehabilitation doctors at the suggestion of physical and occupational therapy. She was somewhat slow to get up and about, but with encouragement and physical therapy, she was up using  a rolling walker.  Consideration was given to rehabilitation, but because of the inavailability of beds, she stayed on the [address] unit and home health care was arranged.  DISPOSITION:  She was discharged to home health care on Jan 11, 2000.  At the time of discharge, her strength was full and her incision was dry.  DISCHARGE MEDICATIONS:  She was taking Vicodin for pain.  FOLLOW-UP:  Follow-up will be in the [facility] office in a week for suture removal. DD:  03/30/00 TD:  04/02/00 Job: [id number]

## 2011-01-20 NOTE — H&P (Signed)
[facility]  Patient:    Meagan Holt, Meagan Holt Visit Number: [id number] MRN: [id number]          Service Type: Attending:  Oley Balm. Pricilla Holm, D.P.M. Dictated by:   Oley Balm Pricilla Holm, D.P.M.                           History and Physical  HISTORY OF PRESENT ILLNESS:  Ms. Meagan Holt is a 70 year old white female that underwent correction of a bunion deformity approximately two months ago on November 26, 2001 and has had a pin that has moved and she is having symptoms from the internal fixation device and it has been decided to remove the internal fixation device.  PAST MEDICAL HISTORY:  The patient was hospitalized for tonsillectomy, appendectomy, cholecystectomy, kidney stones, cyst removed from her lung.  MEDICATIONS:  1. Celebrex.  2. Neurontin 300 mg q.d.  3. Aspirin.  ALLERGIES:  LATEX GLOVES.  FAMILY HISTORY:  No family history.  SOCIAL HISTORY:  Smokes two packs of cigarettes a day, very rarely drinks.  TRANSFUSION:  She did have a transfusion in 1980.  REVIEW OF SYSTEMS:  Reveals some history of bleeding tendencies. She has been advised to discontinue aspirin approximately one week before surgery. She also has a history of arthritis, cyst removed from her lung that was benign.  PHYSICAL EXAMINATION:  EXTREMITIES:  Lower extremity exam reveals palpable pedal pulses, both DP and PT with spontaneous capillary filling time.  NEUROLOGIC:  Essentially within normal limits.  MUSCULOSKELETAL:  Reveals well healed scar on the right foot from previous bunion deformity as well as a dorsal protrusion of the pin that is painful to digital pressure. X-rays reveal movement of the pin in the bunion site.  PLAN:  I reviewed with the patient surgical removal of the same under local anesthesia, the same will be performed. The patient was reviewed and consent form signed. Surgery has been scheduled for 01/30/02. Dictated by:   Oley Balm Pricilla Holm, D.P.M. Attending:   Oley Balm. Pricilla Holm, D.P.M. DD:  01/29/02 TD:  01/29/02 Job: [id number]

## 2011-01-20 NOTE — H&P (Signed)
[facility]  Patient:    Meagan Holt, Meagan Holt Visit Number: [id number] MRN: [id number]          Service Type: DSU Location: DAY Attending Physician:  Abran Richard Dictated by:   Oley Balm Pricilla Holm, D.P.M. Admit Date:  11/25/2001 Discharge Date: 11/25/2001                           History and Physical  HISTORY OF PRESENT ILLNESS:  The patient is a 70 year old white female that has had a long-standing history of pain from painful bunion deformity of her right foot.  Patient relates that she finds it difficult wearing enclosed shoes.  She has tried to wear larger shoes without relief.  She relates her bunion has been red, swollen and painful and she has taken anti-inflammatory medication, again without significant relief.  Patient requests surgical correction of same.  PAST MEDICAL HISTORY:  The patient was hospitalized for a tonsillectomy, appendectomy, cholecystectomy, kidney stones.  She had a cyst removed from her lung.  MEDICATIONS: 1. Celebrex. 2. Neurontin 300 mg four a day. 3. Aspirin.  ALLERGIES:  She relates an allergy to LATEX.  FAMILY HISTORY:  She has no family history.  SOCIAL HISTORY:  She smokes two packs of cigarettes a day and very rarely drinks.  TRANSFUSION HISTORY:  She did have a transfusion in 1980.  REVIEW OF SYSTEMS:  Review of systems reveals history of some bleeding tendencies because of aspirin; she was advised to discontinue the aspirin approximately one week before surgery.  History of arthritis.  She had a cyst removed from her lung -- it was benign -- and some kidney infections.  PHYSICAL EXAMINATION:  EXTREMITIES:  Lower extremity exam reveals palpable pedal pulses, both DP and PT, with spontaneous capillary filling time.  NEUROLOGIC:  Exam essentially within normal limits.  MUSCULOSKELETAL:  Exam reveals pain to palpation over the medial eminence of the first metatarsal head of the right foot with lateral  deviation of the hallux consistent with hallux valgus deformity.  ASSESSMENT:  Hallux valgus deformity of right foot.  X-RAY FINDINGS:  X-rays taken reveal hypertrophy of the medial eminence of the first metatarsal head with lateral deviation of the hallux.  Assessment: Hallux valgus deformity.  PLAN:  Patient will undergo surgical correction of same.  I reviewed the procedure with the patient including complications of procedure such as infection, bone infection and postoperative pain, swelling, etc; patient seems to understand the same and surgery has been scheduled for November 25, 2001. Dictated by:   Oley Balm Pricilla Holm, D.P.M. Attending Physician:  Abran Richard DD:  11/24/01 TD:  11/25/01 Job: [id number]

## 2011-06-16 LAB — URINALYSIS, ROUTINE W REFLEX MICROSCOPIC
Bilirubin Urine: NEGATIVE
Bilirubin Urine: NEGATIVE
Glucose, UA: NEGATIVE
Glucose, UA: NEGATIVE
Hgb urine dipstick: NEGATIVE
Ketones, ur: NEGATIVE
Ketones, ur: NEGATIVE
Leukocytes, UA: NEGATIVE
Nitrite: NEGATIVE
Nitrite: NEGATIVE
Protein, ur: NEGATIVE
Protein, ur: NEGATIVE
Specific Gravity, Urine: 1.016
Specific Gravity, Urine: 1.017
Urobilinogen, UA: 0.2
Urobilinogen, UA: 0.2
pH: 5.5
pH: 5.5

## 2011-06-16 LAB — CBC
HCT: 25.8 — ABNORMAL LOW
HCT: 27.2 — ABNORMAL LOW
HCT: 28.1 — ABNORMAL LOW
HCT: 31.1 — ABNORMAL LOW
HCT: 36.5
Hemoglobin: 8.7 — ABNORMAL LOW
Hemoglobin: 10.7 — ABNORMAL LOW
Hemoglobin: 12.2
Hemoglobin: 9.3 — ABNORMAL LOW
Hemoglobin: 9.6 — ABNORMAL LOW
MCHC: 33.7
MCHC: 33.4
MCHC: 34
MCHC: 34.2
MCHC: 34.3
MCV: 92.5
MCV: 92.7
MCV: 93.4
MCV: 93.5
MCV: 93.9
Platelets: 305
Platelets: 241
Platelets: 258
Platelets: 323
Platelets: 389
RBC: 2.79 — ABNORMAL LOW
RBC: 2.94 — ABNORMAL LOW
RBC: 3.01 — ABNORMAL LOW
RBC: 3.33 — ABNORMAL LOW
RBC: 3.89
RDW: 13.8
RDW: 13.2
RDW: 13.5
RDW: 14
RDW: 14
WBC: 7.6
WBC: 12.6 — ABNORMAL HIGH
WBC: 12.7 — ABNORMAL HIGH
WBC: 14.4 — ABNORMAL HIGH
WBC: 9.6

## 2011-06-16 LAB — TYPE AND SCREEN
ABO/RH(D): O POS
Antibody Screen: NEGATIVE

## 2011-06-16 LAB — BASIC METABOLIC PANEL
BUN: 5 — ABNORMAL LOW
BUN: 5 — ABNORMAL LOW
CO2: 31
CO2: 26
Calcium: 8.5
Calcium: 8.5
Chloride: 95 — ABNORMAL LOW
Chloride: 101
Creatinine, Ser: 0.62
Creatinine, Ser: 0.55
GFR calc Af Amer: >60
GFR calc Af Amer: >60
GFR calc non Af Amer: >60
GFR calc non Af Amer: >60
Glucose, Bld: 94
Glucose, Bld: 105 — ABNORMAL HIGH
Potassium: 3.9
Potassium: 3.8
Sodium: 137
Sodium: 138

## 2011-06-16 LAB — URINE CULTURE
Colony Count: NO GROWTH
Culture: NO GROWTH
Special Requests: NEGATIVE

## 2011-06-16 LAB — CREATININE, SERUM
Creatinine, Ser: 0.62
GFR calc Af Amer: >60
GFR calc non Af Amer: >60

## 2011-06-16 LAB — CULTURE, BLOOD (ROUTINE X 2)
Culture: NO GROWTH
Culture: NO GROWTH

## 2011-06-16 LAB — PROTIME-INR
INR: 1.6 — ABNORMAL HIGH
INR: 2.1 — ABNORMAL HIGH
INR: 0.9
INR: 1.1
INR: 1.2
INR: 1.5
Prothrombin Time: 19.8 — ABNORMAL HIGH
Prothrombin Time: 24.2 — ABNORMAL HIGH
Prothrombin Time: 12.4
Prothrombin Time: 13.9
Prothrombin Time: 15.9 — ABNORMAL HIGH
Prothrombin Time: 18.8 — ABNORMAL HIGH

## 2011-06-16 LAB — COMPREHENSIVE METABOLIC PANEL
ALT: 24
AST: 40 — ABNORMAL HIGH
Albumin: 3.5
Alkaline Phosphatase: 89
BUN: 10
CO2: 29
Calcium: 9.5
Chloride: 104
Creatinine, Ser: 0.35 — ABNORMAL LOW
GFR calc Af Amer: >60
GFR calc non Af Amer: >60
Glucose, Bld: 99
Potassium: 4.3
Sodium: 140
Total Bilirubin: 0.5
Total Protein: 7.1

## 2011-06-16 LAB — HEMOGLOBIN AND HEMATOCRIT, BLOOD
HCT: 32.2 — ABNORMAL LOW
Hemoglobin: 11.2 — ABNORMAL LOW

## 2011-06-16 LAB — DIFFERENTIAL
Basophils Absolute: 0
Basophils Relative: 1
Eosinophils Absolute: 0.2
Eosinophils Relative: 3
Lymphocytes Relative: 30
Lymphs Abs: 2.9
Monocytes Absolute: 0.6
Monocytes Relative: 6
Neutro Abs: 5.8
Neutrophils Relative %: 60

## 2011-06-16 LAB — URINE MICROSCOPIC-ADD ON

## 2011-06-16 LAB — BUN: BUN: 9

## 2011-06-16 LAB — ABO/RH: ABO/RH(D): O POS

## 2011-06-16 LAB — B-NATRIURETIC PEPTIDE (CONVERTED LAB): Pro B Natriuretic peptide (BNP): <30

## 2011-07-26 ENCOUNTER — Other Ambulatory Visit: Payer: Self-pay | Admitting: Family Medicine

## 2011-07-26 DIAGNOSIS — M5412 Radiculopathy, cervical region: Secondary | ICD-10-CM

## 2011-07-26 DIAGNOSIS — R51 Headache: Secondary | ICD-10-CM

## 2011-08-03 ENCOUNTER — Other Ambulatory Visit: Payer: Self-pay

## 2011-08-09 ENCOUNTER — Other Ambulatory Visit: Payer: Self-pay

## 2012-03-04 ENCOUNTER — Other Ambulatory Visit: Payer: Self-pay | Admitting: Family Medicine

## 2012-03-04 ENCOUNTER — Other Ambulatory Visit: Payer: Self-pay

## 2012-03-04 DIAGNOSIS — R109 Unspecified abdominal pain: Secondary | ICD-10-CM

## 2012-03-05 ENCOUNTER — Ambulatory Visit
Admission: RE | Admit: 2012-03-05 | Discharge: 2012-03-05 | Disposition: A | Discharge status: Home / Self Care | Payer: Medicare Other | Source: Ambulatory Visit | Attending: Family Medicine | Admitting: Family Medicine

## 2012-03-05 DIAGNOSIS — R109 Unspecified abdominal pain: Secondary | ICD-10-CM

## 2012-03-05 MED ORDER — IOHEXOL 300 MG/ML  SOLN
125.0000 mL | Freq: Once | INTRAMUSCULAR | Status: AC | PRN
  Administered 2012-03-05: 125 mL via INTRAVENOUS

## 2013-07-30 ENCOUNTER — Inpatient Hospital Stay (HOSPITAL_COMMUNITY)
Admission: EM | Admit: 2013-07-30 | Discharge: 2013-08-01 | DRG: 069 | Disposition: A | Discharge status: Home Health | Payer: Medicare Other | Attending: Internal Medicine | Admitting: Internal Medicine

## 2013-07-30 ENCOUNTER — Emergency Department (HOSPITAL_COMMUNITY): Payer: Medicare Other

## 2013-07-30 ENCOUNTER — Encounter (HOSPITAL_COMMUNITY): Payer: Self-pay | Admitting: Internal Medicine

## 2013-07-30 ENCOUNTER — Inpatient Hospital Stay (HOSPITAL_COMMUNITY): Payer: Medicare Other

## 2013-07-30 DIAGNOSIS — Z9884 Bariatric surgery status: Secondary | ICD-10-CM

## 2013-07-30 DIAGNOSIS — J449 Chronic obstructive pulmonary disease, unspecified: Secondary | ICD-10-CM | POA: Diagnosis present

## 2013-07-30 DIAGNOSIS — G459 Transient cerebral ischemic attack, unspecified: Principal | ICD-10-CM | POA: Diagnosis present

## 2013-07-30 DIAGNOSIS — J4489 Other specified chronic obstructive pulmonary disease: Secondary | ICD-10-CM | POA: Diagnosis present

## 2013-07-30 DIAGNOSIS — R262 Difficulty in walking, not elsewhere classified: Secondary | ICD-10-CM

## 2013-07-30 DIAGNOSIS — Z87891 Personal history of nicotine dependence: Secondary | ICD-10-CM

## 2013-07-30 DIAGNOSIS — Z79899 Other long term (current) drug therapy: Secondary | ICD-10-CM

## 2013-07-30 DIAGNOSIS — Z7982 Long term (current) use of aspirin: Secondary | ICD-10-CM

## 2013-07-30 DIAGNOSIS — W1809XA Striking against other object with subsequent fall, initial encounter: Secondary | ICD-10-CM | POA: Diagnosis present

## 2013-07-30 DIAGNOSIS — G8929 Other chronic pain: Secondary | ICD-10-CM | POA: Diagnosis present

## 2013-07-30 DIAGNOSIS — S0990XA Unspecified injury of head, initial encounter: Secondary | ICD-10-CM | POA: Diagnosis present

## 2013-07-30 DIAGNOSIS — Y92009 Unspecified place in unspecified non-institutional (private) residence as the place of occurrence of the external cause: Secondary | ICD-10-CM

## 2013-07-30 DIAGNOSIS — R270 Ataxia, unspecified: Secondary | ICD-10-CM

## 2013-07-30 HISTORY — DX: Personal history of other diseases of the digestive system: Z87.19

## 2013-07-30 HISTORY — DX: Unspecified chronic bronchitis: J42

## 2013-07-30 HISTORY — DX: Gastro-esophageal reflux disease without esophagitis: K21.9

## 2013-07-30 HISTORY — DX: Chronic obstructive pulmonary disease, unspecified: J44.9

## 2013-07-30 HISTORY — DX: Repeated falls: R29.6

## 2013-07-30 HISTORY — DX: Unspecified osteoarthritis, unspecified site: M19.90

## 2013-07-30 HISTORY — DX: Personal history of other medical treatment: Z92.89

## 2013-07-30 HISTORY — DX: Calculus of kidney: N20.0

## 2013-07-30 LAB — CK: Total CK: 214 U/L — ABNORMAL HIGH (ref 7–177)

## 2013-07-30 LAB — CBC WITH DIFFERENTIAL/PLATELET
Basophils Absolute: 0 10*3/uL (ref 0.0–0.1)
Basophils Relative: 0 % (ref 0–1)
Eosinophils Absolute: 0.2 10*3/uL (ref 0.0–0.7)
Eosinophils Relative: 3 % (ref 0–5)
HCT: 42.4 % (ref 36.0–46.0)
Hemoglobin: 14.1 g/dL (ref 12.0–15.0)
Lymphocytes Relative: 37 % (ref 12–46)
Lymphs Abs: 2.6 10*3/uL (ref 0.7–4.0)
MCH: 32.7 pg (ref 26.0–34.0)
MCHC: 33.3 g/dL (ref 30.0–36.0)
MCV: 98.4 fL (ref 78.0–100.0)
Monocytes Absolute: 0.4 10*3/uL (ref 0.1–1.0)
Monocytes Relative: 6 % (ref 3–12)
Neutro Abs: 3.8 10*3/uL (ref 1.7–7.7)
Neutrophils Relative %: 54 % (ref 43–77)
Platelets: 174 10*3/uL (ref 150–400)
RBC: 4.31 MIL/uL (ref 3.87–5.11)
RDW: 13.9 % (ref 11.5–15.5)
WBC: 7.1 10*3/uL (ref 4.0–10.5)

## 2013-07-30 LAB — URINALYSIS, ROUTINE W REFLEX MICROSCOPIC
Bilirubin Urine: NEGATIVE
Glucose, UA: NEGATIVE mg/dL
Hgb urine dipstick: NEGATIVE
Ketones, ur: NEGATIVE mg/dL
Nitrite: NEGATIVE
Protein, ur: NEGATIVE mg/dL
Specific Gravity, Urine: 1.015 (ref 1.005–1.030)
Urobilinogen, UA: 0.2 mg/dL (ref 0.0–1.0)
pH: 5 (ref 5.0–8.0)

## 2013-07-30 LAB — COMPREHENSIVE METABOLIC PANEL
ALT: 30 U/L (ref 0–35)
AST: 38 U/L — ABNORMAL HIGH (ref 0–37)
Albumin: 3.7 g/dL (ref 3.5–5.2)
Alkaline Phosphatase: 84 U/L (ref 39–117)
BUN: 20 mg/dL (ref 6–23)
CO2: 26 mEq/L (ref 19–32)
Calcium: 9 mg/dL (ref 8.4–10.5)
Chloride: 104 mEq/L (ref 96–112)
Creatinine, Ser: 0.59 mg/dL (ref 0.50–1.10)
GFR calc Af Amer: >90 mL/min (ref >90)
GFR calc non Af Amer: 88 mL/min — ABNORMAL LOW (ref >90)
Glucose, Bld: 92 mg/dL (ref 70–99)
Potassium: 4.1 mEq/L (ref 3.5–5.1)
Sodium: 141 mEq/L (ref 135–145)
Total Bilirubin: 0.2 mg/dL — ABNORMAL LOW (ref 0.3–1.2)
Total Protein: 6.8 g/dL (ref 6.0–8.3)

## 2013-07-30 LAB — URINE MICROSCOPIC-ADD ON

## 2013-07-30 LAB — POCT I-STAT TROPONIN I: Troponin i, poc: 0 ng/mL (ref 0.00–0.08)

## 2013-07-30 LAB — CG4 I-STAT (LACTIC ACID): Lactic Acid, Venous: 1.28 mmol/L (ref 0.5–2.2)

## 2013-07-30 MED ORDER — SODIUM CHLORIDE 0.9 % IV BOLUS (SEPSIS)
500.0000 mL | Freq: Once | INTRAVENOUS | Status: AC
  Administered 2013-07-30: 500 mL via INTRAVENOUS

## 2013-07-30 NOTE — ED Notes (Signed)
Karen,RN 6N notified waiting on CT to be completed before transfer to floor.

## 2013-07-30 NOTE — ED Provider Notes (Signed)
Medical screening examination/treatment/procedure(s) were conducted as a shared visit with non-physician practitioner(s) and myself.  I personally evaluated the patient during the encounter.  EKG Interpretation    Date/Time:  Wednesday July 30 2013 16:47:36 EST Ventricular Rate:  68 PR Interval:  206 QRS Duration: 103 QT Interval:  441 QTC Calculation: 469 R Axis:   81 Text Interpretation:  Sinus arrhythmia Ventricular premature complex Borderline right axis deviation No significant change since last tracing Confirmed by Ethelda Chick  MD, Oliviagrace Crisanti ([id number]) on 07/30/2013 5:04:23 PM             Doug Sou, MD 07/30/13 2113

## 2013-07-30 NOTE — ED Notes (Signed)
Pt reports to the ED for eval of fall yesterday morning. Pt was unsure what made her fall. Pt reports she fell at hit the front of her head on the metal frame of the bed and then fell over backwards and hit her back on the floor. Pt denies LOC. Pt reporting HA, back pain, and joint pain but has hx of chronic pain. Pt has small ecchymosis to the left anterior forehead. Pt was unchanged from yesterday and went to her PCPs office and drove herself as well as walked into the office with a walker. PERRL intact. Pt passed spinal cord core assessment and is not on blood thinners. Pt more lethargic than normal but is oriented x 4. Denies any N/V. Skin warm, pale, and dry and resp e/u.

## 2013-07-30 NOTE — ED Provider Notes (Signed)
Patient fell yesterday. Striking her for head. Complains of frontal headache. She denies syncope. Today she has difficulty walking. Feels off balance. She's been using down her mother's walker since today. She normally walks unassisted. On exam no distress. Glasgow Coma Score 15. HEENT exam there is a hematoma to the center of the forehead, golf ball size. Otherwise atraumatic neck supple with no bruit no tenderness neurologically Glasgow Coma Score 15 gait is broad-based and unsteady. Motor strength 5 over 5 overall.  Doug Sou, MD 07/30/13 2113

## 2013-07-30 NOTE — ED Provider Notes (Signed)
CSN: [id number]     Arrival date & time 07/30/13  1605 History   First MD Initiated Contact with Patient 07/30/13 1616     Chief Complaint  Patient presents with  . Fall   (Consider location/radiation/quality/duration/timing/severity/associated sxs/prior Treatment) HPI Comments: 72 yo female with hx of chronic knee pain/arthritis, chronic bronchitis, right lung lobectomy, HTN, hiatal hernia, bleeding ulcers, kidney stones, peripheral neuropathies presents for evaluation of altered mental status/lethargy starting yesterday after a fall at home. Patient reports that she was coming out of the bathroom when she fell, thinks she lost her balance, and struck her forehead on the bed frame, fell backwards and struck the occipital scalp on the floor. No LOC. Reports lying there for about 2 hours before she could contact someone to help her up. Since that time, she has been dizzy, lightheaded and off-balance, worse with ambulation, now using a walker for support. Endorses dysuria and urinary frequency. Denies fever, chills, recent illness, chest pain, dyspnea, headache. Does have head pain to the forehead locally where she struck her head.  The history is provided by the patient.    No past medical history on file. No past surgical history on file. No family history on file. History  Substance Use Topics  . Smoking status: Not on file  . Smokeless tobacco: Not on file  . Alcohol Use: Not on file   OB History   No data available     Review of Systems  Constitutional: Negative for fever.  HENT: Negative for rhinorrhea and sore throat.   Eyes: Negative for pain and visual disturbance.  Respiratory: Negative for cough and shortness of breath.   Cardiovascular: Negative for chest pain.  Gastrointestinal: Negative for nausea, vomiting and abdominal pain.  Genitourinary: Negative for dysuria.  Musculoskeletal: Positive for arthralgias (knee pain-- similar to baseline) and gait problem. Negative for  back pain.  Skin: Negative for rash.  Neurological: Positive for dizziness and light-headedness. Negative for facial asymmetry, speech difficulty, numbness and headaches.  Hematological: Negative for adenopathy.  Psychiatric/Behavioral: Negative for agitation.    Allergies  Latex and Doxycycline  Home Medications  No current outpatient prescriptions on file. BP 132/114  Pulse 67  Temp(Src) 98 F (36.7 C) (Oral)  Resp 18  SpO2 98% Physical Exam  Nursing note and vitals reviewed. Constitutional: She is oriented to person, place, and time. She appears well-developed and well-nourished. She appears lethargic.  HENT:  Head: Normocephalic.    Right Ear: External ear normal.  Left Ear: External ear normal.  Mouth/Throat: Oropharynx is clear and moist.  Eyes: Conjunctivae and EOM are normal. Pupils are equal, round, and reactive to light.  Neck: Muscular tenderness ( centrally) present. No spinous process tenderness present. Normal range of motion present.  Cardiovascular: Normal rate, regular rhythm, normal heart sounds and intact distal pulses.   Pulmonary/Chest: Effort normal. She has wheezes ( mild). She exhibits no tenderness.  Abdominal: Soft. Bowel sounds are normal. There is no tenderness.  Musculoskeletal: She exhibits edema ( trace bilateral lower extremities).       Thoracic back: Normal.       Lumbar back: Normal.  Neurological: She is oriented to person, place, and time. She has normal strength. She appears lethargic. No sensory deficit. Coordination and gait abnormal. GCS eye subscore is 4. GCS verbal subscore is 5. GCS motor subscore is 6.  Ataxic unsteady gait, needs assistance with any ambulation  Skin: Skin is warm and dry.  Psychiatric: She has a normal mood  and affect.    ED Course  Procedures (including critical care time) Labs Review Labs Reviewed  COMPREHENSIVE METABOLIC PANEL - Abnormal; Notable for the following:    AST 38 (*)    Total Bilirubin 0.2  (*)    GFR calc non Af Amer 88 (*)    All other components within normal limits  URINALYSIS, ROUTINE W REFLEX MICROSCOPIC - Abnormal; Notable for the following:    Leukocytes, UA TRACE (*)    All other components within normal limits  CK - Abnormal; Notable for the following:    Total CK 214 (*)    All other components within normal limits  CBC WITH DIFFERENTIAL  URINE MICROSCOPIC-ADD ON  CG4 I-STAT (LACTIC ACID)  POCT I-STAT TROPONIN I   Imaging Review Dg Chest 2 View  07/30/2013   CLINICAL DATA:  History of syncope and fall now with weakness and lethargy and altered mental status  EXAM: CHEST  2 VIEW  COMPARISON:  Report of a study of August 09, 2012  FINDINGS: The lungs are adequately inflated. There is no focal infiltrate. There are mildly increased interstitial markings in the infrahilar regions bilaterally. By report there were similar findings on the study of August 04, 2012. There is no pleural effusion. There is no pneumothorax. The cardiopericardial silhouette is normal in size. The pulmonary vascularity is not engorged. There is mild tortuosity of the descending thoracic aorta. The observed portions of the bony thorax exhibit no acute abnormalities. There are mild degenerative disc changes of the lower thoracic spine.  IMPRESSION: 1. There is no evidence of pneumonia nor pulmonary alveolar edema. 2. Minimal prominence of the interstitial markings may reflect subsegmental atelectasis. A portion of the findings may be related to previously noted chronic change.   Electronically Signed   By: David  [place]   On: 07/30/2013 18:05   Ct Head Wo Contrast  07/30/2013   CLINICAL DATA:  Status post fall.  EXAM: CT HEAD WITHOUT CONTRAST  CT CERVICAL SPINE WITHOUT CONTRAST  TECHNIQUE: Multidetector CT imaging of the head and cervical spine was performed following the standard protocol without intravenous contrast. Multiplanar CT image reconstructions of the cervical spine were also generated.   COMPARISON:  03/20/2011  FINDINGS: CT HEAD FINDINGS  There is no evidence of intra-axial no extra-axial fluid collections, nor acute hemorrhage. There is mild diffuse cortical atrophy. There is no evidence of subfalcine or tonsillar herniation. The pons and cerebellum are grossly unremarkable. The osseous structures demonstrate no evidence of depressed skull fracture. Visualized paranasal sinuses and mastoid air cells patent.  CT CERVICAL SPINE FINDINGS  There is no evidence of acute fracture or dislocation. There is straightening of the normal cervical lordosis which may represent, placement, muscle spasm possibly positioning. Multilevel severe disc space narrowing is identified throughout the cervical spine as well as endplate sclerosis, peripheral endplate hypertrophic spurring, subchondral cyst formation, and multilevel vacuum disc. Multilevel facet sclerosis and hypertrophy identified.  IMPRESSION: 1. Mild involutional changes without evidence of focal or acute intracranial abnormalities 2. Multilevel severe spondylosis without evidence of acute osseous abnormalities.   Electronically Signed   By: Salome Holmes M.D.   On: 07/30/2013 17:51   Ct Cervical Spine Wo Contrast  07/30/2013   CLINICAL DATA:  Status post fall.  EXAM: CT HEAD WITHOUT CONTRAST  CT CERVICAL SPINE WITHOUT CONTRAST  TECHNIQUE: Multidetector CT imaging of the head and cervical spine was performed following the standard protocol without intravenous contrast. Multiplanar CT image reconstructions of the cervical  spine were also generated.  COMPARISON:  03/20/2011  FINDINGS: CT HEAD FINDINGS  There is no evidence of intra-axial no extra-axial fluid collections, nor acute hemorrhage. There is mild diffuse cortical atrophy. There is no evidence of subfalcine or tonsillar herniation. The pons and cerebellum are grossly unremarkable. The osseous structures demonstrate no evidence of depressed skull fracture. Visualized paranasal sinuses and  mastoid air cells patent.  CT CERVICAL SPINE FINDINGS  There is no evidence of acute fracture or dislocation. There is straightening of the normal cervical lordosis which may represent, placement, muscle spasm possibly positioning. Multilevel severe disc space narrowing is identified throughout the cervical spine as well as endplate sclerosis, peripheral endplate hypertrophic spurring, subchondral cyst formation, and multilevel vacuum disc. Multilevel facet sclerosis and hypertrophy identified.  IMPRESSION: 1. Mild involutional changes without evidence of focal or acute intracranial abnormalities 2. Multilevel severe spondylosis without evidence of acute osseous abnormalities.   Electronically Signed   By: Salome Holmes M.D.   On: 07/30/2013 17:51    EKG Interpretation    Date/Time:  Wednesday July 30 2013 16:47:36 EST Ventricular Rate:  68 PR Interval:  206 QRS Duration: 103 QT Interval:  441 QTC Calculation: 469 R Axis:   81 Text Interpretation:  Sinus arrhythmia Ventricular premature complex Borderline right axis deviation No significant change since last tracing Confirmed by JACUBOWITZ  MD, SAM ([id number]) on 07/30/2013 5:04:23 PM            MDM   1. Ataxia   2. Head injury, initial encounter    Patient with fall/head injury yesterday presents with ataxia and persistent dizziness and lethargy today. Neuro exam reveals broad based gait with ataxia, otherwise no focal weakness. CT head neg, no acute bleeding/fx. CT cervical spine neg, no fx. Labs reveal neg troponin, normal lactic acid, and are WNL, no UTI. Concern for posterior circulation problem, hospitalist consulted and arrangements made for admission.     Simmie Davies, NP 07/30/13 2051

## 2013-07-30 NOTE — ED Notes (Signed)
Pt found at door way on the floor crying out for help. Pt states she was trying to get to the restroom. Pt has Bedside Commode @ bedside. Pt states she hit the back of head. Pt placed safely back in bed. Fall risk band and red socks applied to pt and door left open.  PA notified.

## 2013-07-30 NOTE — ED Provider Notes (Signed)
Masson Nalepa S 9:40 PM Nurse informed me that the patient fell while trying to move from the bedside commode without assistance. Patient reports falling onto the floor and hitting the back of her head. She denies any LOC. She has a small red mark and heat developing hematoma to the back of skull. She is able to stand denies pain in the extremities. She continues to feel some dizziness. There is no obvious focal neuro deficit.  10:00 PM spoke with Dr. Gilford Silvius who is admitting the patient. He would like a repeat CT scan of the head to rule out any intracranial injury before patient is transferred to the floor.  Angus Seller, PA-C 07/30/13 2206

## 2013-07-30 NOTE — H&P (Signed)
[facility] History and Physical  Meagan Holt:[id number] DOB: 06-Sep-1940 DOA: 07/30/2013  Referring physician: ER physician. PCP: Kristian Covey, MD   Chief Complaint: Difficulty walking.  HPI: Meagan Holt is a 72 y.o. female with known history of COPD and chronic low back pain was referred to the ER by patient's primary care physician after patient was found to be increasingly lethargic and difficulty walking. Patient states that last night when trying to walk in her bedroom she fell and hit her bed railing on her for her and then straight back and hit the back of her head. Since then she's been confused and having difficulty walking. She had gone to her PCP who referred her to the ER. CT head only shows hematoma of the forehead. Patient was having difficulty walking in the ER with a wide-based gait and unsteadiness. Patient has been admitted for further management. Patient denies having taken increased dose of her pain medications. Denies any chest pain or shortness of breath nausea vomiting abdominal pain fever chills.   Review of Systems: As presented in the history of presenting illness, rest negative.  Past Medical History  Diagnosis Date  . COPD (chronic obstructive pulmonary disease)    Past Surgical History  Procedure Laterality Date  . Gastric bypass    . Cholecystectomy    . Appendectomy     Social History:  reports that she has quit smoking. She does not have any smokeless tobacco history on file. She reports that she drinks alcohol. Her drug history is not on file. Where does patient live home. Can patient participate in ADLs? Yes.  Allergies  Allergen Reactions  . Latex Itching and Swelling  . Doxycycline Rash    Family History:  Family History  Problem Relation Age of Onset  . Stomach cancer Father       Prior to Admission medications   Medication Sig Start Date End Date Taking? Authorizing Provider  albuterol (PROVENTIL HFA;VENTOLIN HFA)  108 (90 BASE) MCG/ACT inhaler Inhale 1-2 puffs into the lungs every 6 (six) hours as needed for wheezing or shortness of breath.   Yes Historical Provider, MD  budesonide-formoterol (SYMBICORT) 160-4.5 MCG/ACT inhaler Inhale 2 puffs into the lungs 2 (two) times daily.   Yes Historical Provider, MD  buPROPion (WELLBUTRIN XL) 300 MG 24 hr tablet Take 300 mg by mouth daily.   Yes Historical Provider, MD  diclofenac (VOLTAREN) 75 MG EC tablet Take 75 mg by mouth 2 (two) times daily.   Yes Historical Provider, MD  FLUoxetine (PROZAC) 10 MG tablet Take 10 mg by mouth daily.   Yes Historical Provider, MD  furosemide (LASIX) 20 MG tablet Take 20 mg by mouth daily as needed for fluid or edema.   Yes Historical Provider, MD  gabapentin (NEURONTIN) 300 MG capsule Take 300 mg by mouth 3 (three) times daily.   Yes Historical Provider, MD  HYDROcodone-acetaminophen (NORCO) 10-325 MG per tablet Take 1 tablet by mouth every 6 (six) hours as needed for moderate pain.   Yes Historical Provider, MD  Multiple Vitamin (MULTIVITAMIN WITH MINERALS) TABS tablet Take 1 tablet by mouth daily.   Yes Historical Provider, MD  Multiple Vitamins-Minerals (ICAPS PO) Take 1 tablet by mouth daily.   Yes Historical Provider, MD  omega-3 acid ethyl esters (LOVAZA) 1 G capsule Take 1 g by mouth daily.   Yes Historical Provider, MD  zolpidem (AMBIEN) 10 MG tablet Take 10 mg by mouth at bedtime as needed for sleep.  Yes Historical Provider, MD    Physical Exam: Filed Vitals:   07/30/13 1727 07/30/13 1832 07/30/13 1945 07/30/13 2145  BP: 145/87 127/48 125/68 135/73  Pulse: 75 70 69 79  Temp:      TempSrc:      Resp:   18   SpO2:  99% 95% 93%     General:  Well-developed and nourished.  Eyes: Anicteric no pallor.  ENT: No discharge from ears eyes nose mouth. Small hematoma on the left forehead.  Neck: No mass.  Cardiovascular: S1-S2 heard.  Respiratory: No rhonchi or crepitations.  Abdomen: Soft nontender bowel sounds  present.  Skin: No rash. Chronic skin changes.  Musculoskeletal: Right knee has brace.  Psychiatric: Appears normal.  Neurologic: Presently patient is alert awake oriented to time place and person. Moves all extremities.  Labs on Admission:  Basic Metabolic Panel:  Recent Labs Lab 07/30/13 1646  NA 141  K 4.1  CL 104  CO2 26  GLUCOSE 92  BUN 20  CREATININE 0.59  CALCIUM 9.0   Liver Function Tests:  Recent Labs Lab 07/30/13 1646  AST 38*  ALT 30  ALKPHOS 84  BILITOT 0.2*  PROT 6.8  ALBUMIN 3.7   No results found for this basename: LIPASE, AMYLASE,  in the last 168 hours No results found for this basename: AMMONIA,  in the last 168 hours CBC:  Recent Labs Lab 07/30/13 1646  WBC 7.1  NEUTROABS 3.8  HGB 14.1  HCT 42.4  MCV 98.4  PLT 174   Cardiac Enzymes:  Recent Labs Lab 07/30/13 1646  CKTOTAL 214*    BNP (last 3 results) No results found for this basename: PROBNP,  in the last 8760 hours CBG: No results found for this basename: GLUCAP,  in the last 168 hours  Radiological Exams on Admission: Dg Chest 2 View  07/30/2013   CLINICAL DATA:  History of syncope and fall now with weakness and lethargy and altered mental status  EXAM: CHEST  2 VIEW  COMPARISON:  Report of a study of August 09, 2012  FINDINGS: The lungs are adequately inflated. There is no focal infiltrate. There are mildly increased interstitial markings in the infrahilar regions bilaterally. By report there were similar findings on the study of August 04, 2012. There is no pleural effusion. There is no pneumothorax. The cardiopericardial silhouette is normal in size. The pulmonary vascularity is not engorged. There is mild tortuosity of the descending thoracic aorta. The observed portions of the bony thorax exhibit no acute abnormalities. There are mild degenerative disc changes of the lower thoracic spine.  IMPRESSION: 1. There is no evidence of pneumonia nor pulmonary alveolar edema. 2.  Minimal prominence of the interstitial markings may reflect subsegmental atelectasis. A portion of the findings may be related to previously noted chronic change.   Electronically Signed   By: David  [place]   On: 07/30/2013 18:05   Ct Head Wo Contrast  07/30/2013   CLINICAL DATA:  Status post fall.  EXAM: CT HEAD WITHOUT CONTRAST  CT CERVICAL SPINE WITHOUT CONTRAST  TECHNIQUE: Multidetector CT imaging of the head and cervical spine was performed following the standard protocol without intravenous contrast. Multiplanar CT image reconstructions of the cervical spine were also generated.  COMPARISON:  03/20/2011  FINDINGS: CT HEAD FINDINGS  There is no evidence of intra-axial no extra-axial fluid collections, nor acute hemorrhage. There is mild diffuse cortical atrophy. There is no evidence of subfalcine or tonsillar herniation. The pons and cerebellum are  grossly unremarkable. The osseous structures demonstrate no evidence of depressed skull fracture. Visualized paranasal sinuses and mastoid air cells patent.  CT CERVICAL SPINE FINDINGS  There is no evidence of acute fracture or dislocation. There is straightening of the normal cervical lordosis which may represent, placement, muscle spasm possibly positioning. Multilevel severe disc space narrowing is identified throughout the cervical spine as well as endplate sclerosis, peripheral endplate hypertrophic spurring, subchondral cyst formation, and multilevel vacuum disc. Multilevel facet sclerosis and hypertrophy identified.  IMPRESSION: 1. Mild involutional changes without evidence of focal or acute intracranial abnormalities 2. Multilevel severe spondylosis without evidence of acute osseous abnormalities.   Electronically Signed   By: Salome Holmes M.D.   On: 07/30/2013 17:51   Ct Cervical Spine Wo Contrast  07/30/2013   CLINICAL DATA:  Status post fall.  EXAM: CT HEAD WITHOUT CONTRAST  CT CERVICAL SPINE WITHOUT CONTRAST  TECHNIQUE: Multidetector CT imaging  of the head and cervical spine was performed following the standard protocol without intravenous contrast. Multiplanar CT image reconstructions of the cervical spine were also generated.  COMPARISON:  03/20/2011  FINDINGS: CT HEAD FINDINGS  There is no evidence of intra-axial no extra-axial fluid collections, nor acute hemorrhage. There is mild diffuse cortical atrophy. There is no evidence of subfalcine or tonsillar herniation. The pons and cerebellum are grossly unremarkable. The osseous structures demonstrate no evidence of depressed skull fracture. Visualized paranasal sinuses and mastoid air cells patent.  CT CERVICAL SPINE FINDINGS  There is no evidence of acute fracture or dislocation. There is straightening of the normal cervical lordosis which may represent, placement, muscle spasm possibly positioning. Multilevel severe disc space narrowing is identified throughout the cervical spine as well as endplate sclerosis, peripheral endplate hypertrophic spurring, subchondral cyst formation, and multilevel vacuum disc. Multilevel facet sclerosis and hypertrophy identified.  IMPRESSION: 1. Mild involutional changes without evidence of focal or acute intracranial abnormalities 2. Multilevel severe spondylosis without evidence of acute osseous abnormalities.   Electronically Signed   By: Salome Holmes M.D.   On: 07/30/2013 17:51    EKG: Independently reviewed. Normal sinus rhythm with sinus arrhythmia.  Assessment/Plan Principal Problem:   Difficulty walking Active Problems:   Head injury   COPD (chronic obstructive pulmonary disease)   1. Difficulty walking - patient still has unsteady gait and did fall again in the ER and had a repeat CT head. At this time I have discussed with Dr. Roseanne Reno on call neurologist who advised to get MRI brain and if positive for strokes and further stroke workup. Get physical therapy consult. 2. COPD - presently not wheezing. Continue inhalers. Check ABG since patient was  mildly lethargic. 3. Chronic pain - continue home medications.    Code Status: Full code.  Family Communication: None.  Disposition Plan: Admit to inpatient.    Pippa Hanif N. [facility] Pager [phone].  If 7PM-7AM, please contact night-coverage [email or web address] Password [facility] 07/30/2013, 11:03 PM

## 2013-07-31 DIAGNOSIS — I369 Nonrheumatic tricuspid valve disorder, unspecified: Secondary | ICD-10-CM

## 2013-07-31 LAB — CBC WITH DIFFERENTIAL/PLATELET
Basophils Absolute: 0 10*3/uL (ref 0.0–0.1)
Basophils Relative: 1 % (ref 0–1)
Eosinophils Absolute: 0.3 10*3/uL (ref 0.0–0.7)
Eosinophils Relative: 3 % (ref 0–5)
HCT: 41.2 % (ref 36.0–46.0)
Hemoglobin: 13.4 g/dL (ref 12.0–15.0)
Lymphocytes Relative: 36 % (ref 12–46)
Lymphs Abs: 3 10*3/uL (ref 0.7–4.0)
MCH: 32.2 pg (ref 26.0–34.0)
MCHC: 32.5 g/dL (ref 30.0–36.0)
MCV: 99 fL (ref 78.0–100.0)
Monocytes Absolute: 0.6 10*3/uL (ref 0.1–1.0)
Monocytes Relative: 7 % (ref 3–12)
Neutro Abs: 4.5 10*3/uL (ref 1.7–7.7)
Neutrophils Relative %: 54 % (ref 43–77)
Platelets: 162 10*3/uL (ref 150–400)
RBC: 4.16 MIL/uL (ref 3.87–5.11)
RDW: 14.1 % (ref 11.5–15.5)
WBC: 8.4 10*3/uL (ref 4.0–10.5)

## 2013-07-31 LAB — BLOOD GAS, ARTERIAL
Acid-Base Excess: 2 mmol/L (ref 0.0–2.0)
Bicarbonate: 27.2 mEq/L — ABNORMAL HIGH (ref 20.0–24.0)
Drawn by: 252031
O2 Saturation: 90.2 %
Patient temperature: 98.6
TCO2: 28.7 mmol/L (ref 0–100)
pCO2 arterial: 51.2 mmHg — ABNORMAL HIGH (ref 35.0–45.0)
pH, Arterial: 7.345 — ABNORMAL LOW (ref 7.350–7.450)
pO2, Arterial: 63.9 mmHg — ABNORMAL LOW (ref 80.0–100.0)

## 2013-07-31 LAB — COMPREHENSIVE METABOLIC PANEL
ALT: 26 U/L (ref 0–35)
AST: 28 U/L (ref 0–37)
Albumin: 3.4 g/dL — ABNORMAL LOW (ref 3.5–5.2)
Alkaline Phosphatase: 74 U/L (ref 39–117)
BUN: 20 mg/dL (ref 6–23)
CO2: 26 mEq/L (ref 19–32)
Calcium: 8.9 mg/dL (ref 8.4–10.5)
Chloride: 106 mEq/L (ref 96–112)
Creatinine, Ser: 0.49 mg/dL — ABNORMAL LOW (ref 0.50–1.10)
GFR calc Af Amer: >90 mL/min (ref >90)
GFR calc non Af Amer: >90 mL/min (ref >90)
Glucose, Bld: 95 mg/dL (ref 70–99)
Potassium: 3.6 mEq/L (ref 3.5–5.1)
Sodium: 141 mEq/L (ref 135–145)
Total Bilirubin: 0.3 mg/dL (ref 0.3–1.2)
Total Protein: 6.2 g/dL (ref 6.0–8.3)

## 2013-07-31 LAB — HEMOGLOBIN A1C
Hgb A1c MFr Bld: 5.8 % — ABNORMAL HIGH (ref <5.7)
Mean Plasma Glucose: 120 mg/dL — ABNORMAL HIGH (ref <117)

## 2013-07-31 LAB — LIPID PANEL
Cholesterol: 202 mg/dL — ABNORMAL HIGH (ref 0–200)
HDL: 47 mg/dL (ref >39)
LDL Cholesterol: 94 mg/dL (ref 0–99)
Total CHOL/HDL Ratio: 4.3 RATIO
Triglycerides: 304 mg/dL — ABNORMAL HIGH (ref <150)
VLDL: 61 mg/dL — ABNORMAL HIGH (ref 0–40)

## 2013-07-31 LAB — AMMONIA: Ammonia: 30 umol/L (ref 11–60)

## 2013-07-31 MED ORDER — ASPIRIN 300 MG RE SUPP
300.0000 mg | Freq: Every day | RECTAL | Status: DC
  Filled 2013-07-31 (×2): qty 1

## 2013-07-31 MED ORDER — HYDROCODONE-ACETAMINOPHEN 10-325 MG PO TABS
1.0000 | ORAL_TABLET | Freq: Four times a day (QID) | ORAL | Status: DC | PRN
  Administered 2013-07-31 – 2013-08-01 (×3): 1 via ORAL
  Filled 2013-07-31 (×4): qty 1

## 2013-07-31 MED ORDER — BUPROPION HCL ER (XL) 300 MG PO TB24
300.0000 mg | ORAL_TABLET | Freq: Every day | ORAL | Status: DC
  Administered 2013-07-31 – 2013-08-01 (×2): 300 mg via ORAL
  Filled 2013-07-31 (×2): qty 1

## 2013-07-31 MED ORDER — GABAPENTIN 300 MG PO CAPS
300.0000 mg | ORAL_CAPSULE | Freq: Three times a day (TID) | ORAL | Status: DC
  Administered 2013-07-31 – 2013-08-01 (×6): 300 mg via ORAL
  Filled 2013-07-31 (×8): qty 1

## 2013-07-31 MED ORDER — FAMOTIDINE 20 MG PO TABS
20.0000 mg | ORAL_TABLET | Freq: Every day | ORAL | Status: DC
  Administered 2013-07-31 – 2013-08-01 (×2): 20 mg via ORAL
  Filled 2013-07-31 (×2): qty 1

## 2013-07-31 MED ORDER — SENNOSIDES-DOCUSATE SODIUM 8.6-50 MG PO TABS: 1.0000 | ORAL_TABLET | Freq: Every evening | ORAL | Status: DC | PRN

## 2013-07-31 MED ORDER — FLUOXETINE HCL 20 MG PO TABS
10.0000 mg | ORAL_TABLET | Freq: Every day | ORAL | Status: DC
  Administered 2013-07-31: 10 mg via ORAL
  Filled 2013-07-31: qty 1

## 2013-07-31 MED ORDER — BUDESONIDE-FORMOTEROL FUMARATE 160-4.5 MCG/ACT IN AERO
2.0000 | INHALATION_SPRAY | Freq: Two times a day (BID) | RESPIRATORY_TRACT | Status: DC
  Administered 2013-07-31 – 2013-08-01 (×3): 2 via RESPIRATORY_TRACT
  Filled 2013-07-31 (×2): qty 6

## 2013-07-31 MED ORDER — SODIUM CHLORIDE 0.9 % IV SOLN: INTRAVENOUS | Status: DC

## 2013-07-31 MED ORDER — ALBUTEROL SULFATE HFA 108 (90 BASE) MCG/ACT IN AERS: 1.0000 | INHALATION_SPRAY | Freq: Four times a day (QID) | RESPIRATORY_TRACT | Status: DC | PRN

## 2013-07-31 MED ORDER — ENOXAPARIN SODIUM 60 MG/0.6ML ~~LOC~~ SOLN
55.0000 mg | SUBCUTANEOUS | Status: DC
  Administered 2013-07-31 – 2013-08-01 (×2): 55 mg via SUBCUTANEOUS
  Filled 2013-07-31 (×2): qty 0.6

## 2013-07-31 MED ORDER — OMEGA-3-ACID ETHYL ESTERS 1 G PO CAPS
1.0000 g | ORAL_CAPSULE | Freq: Every day | ORAL | Status: DC
  Administered 2013-07-31 – 2013-08-01 (×2): 1 g via ORAL
  Filled 2013-07-31 (×2): qty 1

## 2013-07-31 MED ORDER — ADULT MULTIVITAMIN W/MINERALS CH
1.0000 | ORAL_TABLET | Freq: Every day | ORAL | Status: DC
  Administered 2013-07-31 – 2013-08-01 (×2): 1 via ORAL
  Filled 2013-07-31 (×2): qty 1

## 2013-07-31 MED ORDER — ASPIRIN 325 MG PO TABS
325.0000 mg | ORAL_TABLET | Freq: Every day | ORAL | Status: DC
  Administered 2013-08-01: 325 mg via ORAL
  Filled 2013-07-31 (×2): qty 1

## 2013-07-31 NOTE — ED Provider Notes (Signed)
Medical screening examination/treatment/procedure(s) were performed by non-physician practitioner and as supervising physician I was immediately available for consultation/collaboration.  EKG Interpretation    Date/Time:  Wednesday July 30 2013 16:47:36 EST Ventricular Rate:  68 PR Interval:  206 QRS Duration: 103 QT Interval:  441 QTC Calculation: 469 R Axis:   81 Text Interpretation:  Sinus arrhythmia Ventricular premature complex Borderline right axis deviation No significant change since last tracing Confirmed by Ethelda Chick  MD, SAM ([id number]) on 07/30/2013 5:04:23 PM              Meagan Holt Mort Sawyers, MD 07/31/13 1625

## 2013-07-31 NOTE — Progress Notes (Signed)
[facility] PROGRESS NOTE  Meagan Holt ZOX:[id number] DOB: October 07, 1940 DOA: 07/30/2013 PCP: Kristian Covey, MD  Assessment/Plan:  1. Difficulty walking - patient still has unsteady gait and did fall again in the ER and had a repeat CT head. At this time I have discussed with Dr. Roseanne Reno on call neurologist who advised to get MRI brain and if positive for strokes and further stroke workup. Get physical therapy consult. 2. COPD - presently not wheezing. Continue inhalers. Check ABG since patient was mildly lethargic. 3. Chronic pain - continue home medications. Code Status: Full code.  Family Communication: None.  Disposition Plan: Admit to inpatient.    Consultants:  NEURO  Procedures: MRI BRAIN Antibiotics:  none  HPI/Subjective: Comfortable, wants to know when she can go home, refusing to take aspirin.   Objective: Filed Vitals:   07/31/13 1548  BP: 142/70  Pulse: 86  Temp: 99.5 F (37.5 C)  Resp: 22    Intake/Output Summary (Last 24 hours) at 07/31/13 1837 Last data filed at 07/31/13 1553  Gross per 24 hour  Intake    360 ml  Output      7 ml  Net    353 ml   Filed Weights   07/30/13 2315  Weight: 113.9 kg (251 lb 1.7 oz)    Exam:   General:  Alert afebrile comfortable  Cardiovascular: s1s2  Respiratory: ctab  Abdomen: soft NT ND BS+  Musculoskeletal: NO PEDAL EDEMA  Data Reviewed: Basic Metabolic Panel:  Recent Labs Lab 07/30/13 1646 07/31/13 0350  NA 141 141  K 4.1 3.6  CL 104 106  CO2 26 26  GLUCOSE 92 95  BUN 20 20  CREATININE 0.59 0.49*  CALCIUM 9.0 8.9   Liver Function Tests:  Recent Labs Lab 07/30/13 1646 07/31/13 0350  AST 38* 28  ALT 30 26  ALKPHOS 84 74  BILITOT 0.2* 0.3  PROT 6.8 6.2  ALBUMIN 3.7 3.4*   No results found for this basename: LIPASE, AMYLASE,  in the last 168 hours  Recent Labs Lab 07/31/13 0350  AMMONIA 30   CBC:  Recent Labs Lab 07/30/13 1646 07/31/13 0350  WBC 7.1 8.4   NEUTROABS 3.8 4.5  HGB 14.1 13.4  HCT 42.4 41.2  MCV 98.4 99.0  PLT 174 162   Cardiac Enzymes:  Recent Labs Lab 07/30/13 1646  CKTOTAL 214*   BNP (last 3 results) No results found for this basename: PROBNP,  in the last 8760 hours CBG: No results found for this basename: GLUCAP,  in the last 168 hours  No results found for this or any previous visit (from the past 240 hour(s)).   Studies: Dg Chest 2 View  07/30/2013   CLINICAL DATA:  History of syncope and fall now with weakness and lethargy and altered mental status  EXAM: CHEST  2 VIEW  COMPARISON:  Report of a study of August 09, 2012  FINDINGS: The lungs are adequately inflated. There is no focal infiltrate. There are mildly increased interstitial markings in the infrahilar regions bilaterally. By report there were similar findings on the study of August 04, 2012. There is no pleural effusion. There is no pneumothorax. The cardiopericardial silhouette is normal in size. The pulmonary vascularity is not engorged. There is mild tortuosity of the descending thoracic aorta. The observed portions of the bony thorax exhibit no acute abnormalities. There are mild degenerative disc changes of the lower thoracic spine.  IMPRESSION: 1. There is no evidence of pneumonia nor pulmonary  alveolar edema. 2. Minimal prominence of the interstitial markings may reflect subsegmental atelectasis. A portion of the findings may be related to previously noted chronic change.   Electronically Signed   By: David  [place]   On: 07/30/2013 18:05   Ct Head Wo Contrast  07/30/2013   CLINICAL DATA:  Fall, now with frontal headache  EXAM: CT HEAD WITHOUT CONTRAST  TECHNIQUE: Contiguous axial images were obtained from the base of the skull through the vertex without intravenous contrast.  COMPARISON:  Prior CT from earlier the same day.  FINDINGS: Mild age-related atrophy and chronic microvascular ischemic disease is again noted. No acute intracranial hemorrhage or  infarct identified. No extra-axial fluid collection. No mass or midline shift. No hydrocephalus.  Calvarium is intact. Hyperostosis frontalis interna is noted. Globes are intact. Paranasal sinuses and mastoid air cells are clear.  IMPRESSION: 1. No acute intracranial process. 2. Stable atrophy and chronic microvascular ischemic disease.   Electronically Signed   By: Rise Mu M.D.   On: 07/30/2013 23:11   Ct Head Wo Contrast  07/30/2013   CLINICAL DATA:  Status post fall.  EXAM: CT HEAD WITHOUT CONTRAST  CT CERVICAL SPINE WITHOUT CONTRAST  TECHNIQUE: Multidetector CT imaging of the head and cervical spine was performed following the standard protocol without intravenous contrast. Multiplanar CT image reconstructions of the cervical spine were also generated.  COMPARISON:  03/20/2011  FINDINGS: CT HEAD FINDINGS  There is no evidence of intra-axial no extra-axial fluid collections, nor acute hemorrhage. There is mild diffuse cortical atrophy. There is no evidence of subfalcine or tonsillar herniation. The pons and cerebellum are grossly unremarkable. The osseous structures demonstrate no evidence of depressed skull fracture. Visualized paranasal sinuses and mastoid air cells patent.  CT CERVICAL SPINE FINDINGS  There is no evidence of acute fracture or dislocation. There is straightening of the normal cervical lordosis which may represent, placement, muscle spasm possibly positioning. Multilevel severe disc space narrowing is identified throughout the cervical spine as well as endplate sclerosis, peripheral endplate hypertrophic spurring, subchondral cyst formation, and multilevel vacuum disc. Multilevel facet sclerosis and hypertrophy identified.  IMPRESSION: 1. Mild involutional changes without evidence of focal or acute intracranial abnormalities 2. Multilevel severe spondylosis without evidence of acute osseous abnormalities.   Electronically Signed   By: Salome Holmes M.D.   On: 07/30/2013 17:51    Ct Cervical Spine Wo Contrast  07/30/2013   CLINICAL DATA:  Status post fall.  EXAM: CT HEAD WITHOUT CONTRAST  CT CERVICAL SPINE WITHOUT CONTRAST  TECHNIQUE: Multidetector CT imaging of the head and cervical spine was performed following the standard protocol without intravenous contrast. Multiplanar CT image reconstructions of the cervical spine were also generated.  COMPARISON:  03/20/2011  FINDINGS: CT HEAD FINDINGS  There is no evidence of intra-axial no extra-axial fluid collections, nor acute hemorrhage. There is mild diffuse cortical atrophy. There is no evidence of subfalcine or tonsillar herniation. The pons and cerebellum are grossly unremarkable. The osseous structures demonstrate no evidence of depressed skull fracture. Visualized paranasal sinuses and mastoid air cells patent.  CT CERVICAL SPINE FINDINGS  There is no evidence of acute fracture or dislocation. There is straightening of the normal cervical lordosis which may represent, placement, muscle spasm possibly positioning. Multilevel severe disc space narrowing is identified throughout the cervical spine as well as endplate sclerosis, peripheral endplate hypertrophic spurring, subchondral cyst formation, and multilevel vacuum disc. Multilevel facet sclerosis and hypertrophy identified.  IMPRESSION: 1. Mild involutional changes without evidence of  focal or acute intracranial abnormalities 2. Multilevel severe spondylosis without evidence of acute osseous abnormalities.   Electronically Signed   By: Salome Holmes M.D.   On: 07/30/2013 17:51    Scheduled Meds: . aspirin  300 mg Rectal Daily   Or  . aspirin  325 mg Oral Daily  . budesonide-formoterol  2 puff Inhalation BID  . buPROPion  300 mg Oral Daily  . enoxaparin (LOVENOX) injection  55 mg Subcutaneous Q24H  . famotidine  20 mg Oral Daily  . gabapentin  300 mg Oral TID  . multivitamin with minerals  1 tablet Oral Daily  . omega-3 acid ethyl esters  1 g Oral Daily    Continuous Infusions: . sodium chloride      Principal Problem:   Difficulty walking Active Problems:   Head injury   COPD (chronic obstructive pulmonary disease)    Time spent: 25 min    Meagan Holt  [facility] Pager [phone] If 7PM-7AM, please contact night-coverage at [email or web address], password [facility] 07/31/2013, 6:37 PM  LOS: 1 day

## 2013-07-31 NOTE — Progress Notes (Signed)
PT Cancellation Note  Patient Details Name: Meagan Holt MRN: [id number] DOB: 1941-06-29   Cancelled Treatment:    Reason Eval/Treat Not Completed: Medical issues which prohibited therapy. Spoke with nursing. Pt going to be undergoing further testing. Will re-attempt to see pt at next available time.    Donnamarie Poag [place], [place] [phone] 07/31/2013, 10:33 AM

## 2013-07-31 NOTE — Progress Notes (Signed)
  Echocardiogram 2D Echocardiogram has been performed.  Cathie Beams 07/31/2013, 10:39 AM

## 2013-08-01 ENCOUNTER — Inpatient Hospital Stay (HOSPITAL_COMMUNITY): Payer: Medicare Other

## 2013-08-01 DIAGNOSIS — R279 Unspecified lack of coordination: Secondary | ICD-10-CM

## 2013-08-01 LAB — CLOSTRIDIUM DIFFICILE BY PCR: Toxigenic C. Difficile by PCR: NEGATIVE

## 2013-08-01 MED ORDER — HYDROCODONE-ACETAMINOPHEN 10-325 MG PO TABS
1.0000 | ORAL_TABLET | Freq: Four times a day (QID) | ORAL | Status: DC | PRN
  Administered 2013-08-01: 2 via ORAL
  Filled 2013-08-01: qty 1

## 2013-08-01 MED ORDER — ASPIRIN EC 81 MG PO TBEC: 81.0000 mg | DELAYED_RELEASE_TABLET | Freq: Every day | ORAL | Status: DC

## 2013-08-01 MED ORDER — FAMOTIDINE 20 MG PO TABS: 20.0000 mg | ORAL_TABLET | Freq: Every day | ORAL | Status: DC

## 2013-08-01 NOTE — Evaluation (Signed)
Physical Therapy Evaluation Patient Details Name: Meagan Holt MRN: [id number] DOB: 04/06/1941 Today's Date: 08/01/2013 Time: 4098-1191 PT Time Calculation (min): 28 min  PT Assessment / Plan / Recommendation History of Present Illness  72 yo female s/p fall in bathroom striking head at home and then repeat fall in ED. Pt with hx of COPD and recently lethargic. CT negative at this time.    Clinical Impression  This patient presents with acute pain and decreased functional independence following the above mentioned procedure. At the time of PT eval, pt required supervision for transfers and ambulation with RW. This patient is appropriate for skilled PT interventions in the acute care setting to address functional limitations, improve safety and independence with functional mobility, and return to PLOF. As pt reports she has been going to outpatient physical therapy for her back and knee, she would be appropriate to d/c with a new script to continue outpatient PT for general strengthening and balance training to reduce risk of falls.      PT Assessment  Patient needs continued PT services    Follow Up Recommendations  Outpatient PT    Does the patient have the potential to tolerate intense rehabilitation      Barriers to Discharge Decreased caregiver support Pt lives alone but states there are family/friends that can stay with her during the day    Equipment Recommendations  Rolling walker with 5" wheels (Adult size - rolling walker she has now is borrowed and is youth size)    Recommendations for Other Services     Frequency Min 4X/week    Precautions / Restrictions Precautions Precautions: Fall Restrictions Weight Bearing Restrictions: No   Pertinent Vitals/Pain Pt reports min to mod pain in her back and knee during gait training, however states this is not new for her.      Mobility  Bed Mobility Bed Mobility: Not assessed Details for Bed Mobility Assistance: Pt  received up in chair Transfers Transfers: Sit to Stand;Stand to Sit Sit to Stand: 5: Supervision;With upper extremity assist;From chair/3-in-1 Stand to Sit: 5: Supervision;With upper extremity assist;To toilet Details for Transfer Assistance: Pt demonstrated proper hand placement and safety awareness. Ambulation/Gait Ambulation/Gait Assistance: 5: Supervision;4: Min guard Ambulation Distance (Feet): 300 Feet Assistive device: Rolling walker Ambulation/Gait Assistance Details: VC's for walker placement closer to pt's body for safety. Gait Pattern: Within Functional Limits Gait velocity: Slightly decreased Stairs: No    Exercises     PT Diagnosis: Difficulty walking  PT Problem List: Decreased strength;Decreased range of motion;Decreased activity tolerance;Decreased balance;Decreased mobility;Decreased knowledge of use of DME;Decreased safety awareness PT Treatment Interventions: DME instruction;Gait training;Stair training;Functional mobility training;Therapeutic activities;Therapeutic exercise;Neuromuscular re-education;Patient/family education     PT Goals(Current goals can be found in the care plan section) Acute Rehab PT Goals Patient Stated Goal: to return home PT Goal Formulation: With patient Time For Goal Achievement: 08/08/13 Potential to Achieve Goals: Good  Visit Information  Last PT Received On: 08/01/13 Assistance Needed: +1 History of Present Illness: 72 yo female s/p fall in bathroom striking head at home and then repeat fall in ED. Pt with hx of COPD and recently lethargic. CT negative at this time.         Prior Functioning  Home Living Family/patient expects to be discharged to:: Private residence Living Arrangements: Alone Available Help at Discharge: Family;Friend(s);Available PRN/intermittently Type of Home: House Home Access: Ramped entrance Home Layout: Two level (basement but does not go down there) Alternate Level Stairs-Number of Steps: 3 steps  to  get to porch for laundry Home Equipment: Walker - standard;Cane - single point;Crutches;Wheelchair - [employer] - 2 wheels;Bedside commode;Grab bars - tub/shower Additional Comments: driving car Prior Function Level of Independence: Independent Communication Communication: No difficulties Dominant Hand: Right    Cognition  Cognition Arousal/Alertness: Awake/alert Behavior During Therapy: WFL for tasks assessed/performed Overall Cognitive Status: Within Functional Limits for tasks assessed    Extremity/Trunk Assessment Upper Extremity Assessment Upper Extremity Assessment: Defer to OT evaluation Lower Extremity Assessment Lower Extremity Assessment: Overall WFL for tasks assessed Cervical / Trunk Assessment Cervical / Trunk Assessment: Normal   Balance Balance Balance Assessed: Yes Static Sitting Balance Static Sitting - Balance Support: Feet supported;No upper extremity supported Static Sitting - Level of Assistance: 6: Modified independent (Device/Increase time) Static Standing Balance Static Standing - Balance Support: Right upper extremity supported Static Standing - Level of Assistance: 5: Stand by assistance  End of Session PT - End of Session Equipment Utilized During Treatment: Gait belt Activity Tolerance: Patient tolerated treatment well Patient left: in chair;with call bell/phone within reach Nurse Communication: Mobility status  GP     Ruthann Cancer 08/01/2013, 2:39 PM  Ruthann Cancer, PT, DPT [phone]

## 2013-08-01 NOTE — Progress Notes (Signed)
UR completed.  Allan Bacigalupi, RN BSN MHA CCM Trauma/Neuro ICU Case Manager [phone]  

## 2013-08-01 NOTE — Evaluation (Signed)
Occupational Therapy Evaluation Patient Details Name: Meagan Holt MRN: [id number] DOB: [birth year] Today's Date: 08/01/2013 Time: 4098-1191 [phone]) OT Time Calculation (min): 35 min  OT Assessment / Plan / Recommendation History of present illness 72 yo female s/p fall in bathroom striking head at home and then repeat fall in ED. Pt with hx of COPD and recently lethargic. CT negative at this time.     Clinical Impression   PT admitted with s/p fall . Pt currently with functional limitiations due to the deficits listed below (see OT problem list). Pt reports inability to get off floor at home and no recall of actually falling. Pt will benefit from skilled OT to increase their independence and safety with adls and balance to allow discharge HHOT. Pt demonstrates DOE with all mobility. OT to address tub transfer and bed mobility next session.     OT Assessment  Patient needs continued OT Services    Follow Up Recommendations  Home health OT;Supervision - Intermittent    Barriers to Discharge      Equipment Recommendations  None recommended by OT    Recommendations for Other Services    Frequency  Min 2X/week    Precautions / Restrictions Precautions Precautions: Fall   Pertinent Vitals/Pain None reported    ADL  Eating/Feeding: Independent Where Assessed - Eating/Feeding: Chair Grooming: Wash/dry hands;Wash/dry face;Brushing hair;Supervision/safety Where Assessed - Grooming: Unsupported standing Upper Body Bathing: Chest;Right arm;Left arm;Abdomen;Supervision/safety Where Assessed - Upper Body Bathing: Unsupported standing Lower Body Bathing: Supervision/safety Where Assessed - Lower Body Bathing: Unsupported standing (Left UE on counter for support) Upper Body Dressing: Supervision/safety Where Assessed - Upper Body Dressing: Unsupported standing Lower Body Dressing: Min guard Where Assessed - Lower Body Dressing: Unsupported sit to stand (prolonged time and incr  effort) Toilet Transfer: Supervision/safety Toilet Transfer Method: Sit to Barista: Regular height toilet;Grab bars Toileting - Clothing Manipulation and Hygiene: Supervision/safety Where Assessed - Engineer, mining and Hygiene: Sit to stand from 3-in-1 or toilet Equipment Used: Standard walker Transfers/Ambulation Related to ADLs: Pt with standard walker in room that was patients mothers. PT reports having a rolling walker at home. Pt educated on positioning inside RW and safety. Pt states "i think getting all the way up in it like this is going to be worse" Pt with good return demo after education.  ADL Comments: Pt completing toilet transfer, full adl at sink and using shower cap. Pt needed incr time to doff mesh underwear and UE support for balance. Pt reports having a cousin and family friend that can assist at d/c. Pt without bowel movement but remains with passing gas at this time. Pt reports feeling back to baseline. pt has a tub at home and next session will focus on tub tranfer.    OT Diagnosis: Generalized weakness  OT Problem List: Decreased strength;Decreased activity tolerance;Impaired balance (sitting and/or standing);Decreased safety awareness;Decreased knowledge of use of DME or AE;Decreased knowledge of precautions OT Treatment Interventions: Self-care/ADL training;Therapeutic exercise;DME and/or AE instruction;Therapeutic activities;Patient/family education;Balance training   OT Goals(Current goals can be found in the care plan section) Acute Rehab OT Goals Patient Stated Goal: to return home OT Goal Formulation: With patient Time For Goal Achievement: 08/15/13 Potential to Achieve Goals: Good  Visit Information  Last OT Received On: 08/01/13 Assistance Needed: +1 History of Present Illness: 72 yo female s/p fall in bathroom striking head at home and then repeat fall in ED. Pt with hx of COPD and recently lethargic. CT negative at  this  time.         Prior Functioning     Home Living Family/patient expects to be discharged to:: Private residence Living Arrangements: Alone Available Help at Discharge: Family;Friend(s);Available PRN/intermittently Type of Home: House Home Access: Ramped entrance Home Layout: One level;Other (Comment) (has basement but does not go down to basement) Home Equipment: Walker - standard;Cane - single point;Crutches;Wheelchair - [employer] - 2 wheels;Bedside commode;Grab bars - tub/shower Additional Comments: driving car Prior Function Level of Independence: Independent Communication Communication: No difficulties Dominant Hand: Right         Vision/Perception Vision - History Baseline Vision: Other (comment) (driving glasses) Vision - Assessment Additional Comments: pt has glasses on order for driving   Cognition  Cognition Arousal/Alertness: Awake/alert Behavior During Therapy: WFL for tasks assessed/performed Overall Cognitive Status: Within Functional Limits for tasks assessed    Extremity/Trunk Assessment Upper Extremity Assessment Upper Extremity Assessment: Overall WFL for tasks assessed Lower Extremity Assessment Lower Extremity Assessment: Defer to PT evaluation Cervical / Trunk Assessment Cervical / Trunk Assessment: Normal     Mobility Bed Mobility Bed Mobility: Not assessed Transfers Transfers: Stand to Sit;Sit to Stand Sit to Stand: 5: Supervision;With upper extremity assist;From chair/3-in-1 Stand to Sit: 5: Supervision;With upper extremity assist;To toilet Details for Transfer Assistance: cues for hand placement to avoid pullign on RW     Exercise     Balance     End of Session OT - End of Session Activity Tolerance: Patient tolerated treatment well Patient left: in chair;with call bell/phone within reach Nurse Communication: Mobility status;Precautions  GO     Harolyn Rutherford 08/01/2013, 9:53 AM Pager: [phone]

## 2013-08-01 NOTE — Progress Notes (Signed)
  Nutrition Brief Note  Patient identified on the Malnutrition Screening Tool (MST) Report  Wt Readings from Last 15 Encounters:  07/30/13 251 lb 1.7 oz (113.9 kg)    Body mass index is 38.19 kg/(m^2). Patient meets criteria for obesity based on current BMI.   Current diet order is regular, patient is consuming approximately 75% of meals at this time. Labs and medications reviewed.   Pt reports no loss of appetite. Pt reports that she lost about 10 lbs according to the bed scale, but that she is glad if she has lost any weight.  No nutrition interventions warranted at this time. If nutrition issues arise, please consult RD.   Ebbie Latus RD, LDN

## 2013-08-01 NOTE — Progress Notes (Signed)
Pt arranged with HHRN/PT/CSW per MD orders. Address and phone number in EPIC are correct according to the patient.  Rolling walker arranged with [facility].  Pt requested [facility] also for [facility] as she has used them in the past.

## 2013-08-01 NOTE — Progress Notes (Signed)
SLP Cancellation Note  Patient Details Name: Meagan Holt MRN: [id number] DOB: 1941/02/07   Cancelled treatment:       Reason Eval/Treat Not Completed: Patient at procedure or test/unavailable   Takumi Din, Riley Nearing 08/01/2013, 11:25 AM

## 2013-08-02 NOTE — Discharge Summary (Signed)
Physician Discharge Summary  [place] CARMACK RUE:[id number] DOB: [birth year] DOA: 07/30/2013  PCP: Kristian Covey, MD  Admit date: 07/30/2013 Discharge date: 08/01/2013  Time spent: 30 minutes  Recommendations for Outpatient Follow-up:  Follow up with PCP in one week Follow up with home health PT.  Discharge Diagnoses:  Principal Problem:   Difficulty walking TIA Active Problems:   Head injury   COPD (chronic obstructive pulmonary disease)   Discharge Condition: improved  Diet recommendation: regular diet  Filed Weights   07/30/13 2315  Weight: 113.9 kg (251 lb 1.7 oz)    History of present illness:  Meagan Holt is a 72 y.o. female with known history of COPD and chronic low back pain was referred to the ER by patient's primary care physician after patient was found to be increasingly lethargic and difficulty walking. Patient states that last night when trying to walk in her bedroom she fell and hit her bed railing on her for her and then straight back and hit the back of her head. Since then she's been confused and having difficulty walking. She had gone to her PCP who referred her to the ER. CT head only shows hematoma of the forehead. Patient was having difficulty walking in the ER with a wide-based gait and unsteadiness. Patient has been admitted for further management   Hospital Course:  Difficulty walking - patient still has unsteady gait and did fall again in the ER and had a repeat CT head, which was negative for acute changes. As per the patient's family friend, she overdosed on percocet and fell a lot at home. We have found that she uses to two pharmacies to get percocet from different providers. Spoke to the patient in detail about not doing it.  She was worked up for TIA during the hospitalization and the workup so far has come back negative. HER HGBA1C IS 5.8 .  Her echocardiogram revealed Wall thickness was increased in a pattern of mild LVH. Systolic  function was vigorous. The estimated ejection fraction was in the range of 65% to 70%. Wall motion was normal; there were no regional wall motion abnormalities. Doppler parameters are consistent with abnormal left ventricular relaxation (grade 1 diastolic dysfunction).  She was discharged home with home PT and RN.  COPD - presently not wheezing. Continue inhalers.   Chronic pain - continue home medications.   Procedures:  MRI brain  Ct head  Carotid duplex  echocardiogram  Consultations:  none  Discharge Exam: Filed Vitals:   08/01/13 1303  BP: 126/93  Pulse: 75  Temp: 97.4 F (36.3 C)  Resp: 19   General: Alert afebrile comfortable Cardiovascular: s1s2 Respiratory: ctab Abdomen: soft NT ND BS+ Musculoskeletal: NO PEDAL EDEMA    Discharge Instructions  Discharge Orders   Future Orders Complete By Expires   Discharge instructions  As directed    Comments:     Follow up with PCP in one week.       Medication List    STOP taking these medications       zolpidem 10 MG tablet  Commonly known as:  AMBIEN      TAKE these medications       albuterol 108 (90 BASE) MCG/ACT inhaler  Commonly known as:  PROVENTIL HFA;VENTOLIN HFA  Inhale 1-2 puffs into the lungs every 6 (six) hours as needed for wheezing or shortness of breath.     aspirin EC 81 MG tablet  Take 1 tablet (81 mg total) by mouth  daily.     budesonide-formoterol 160-4.5 MCG/ACT inhaler  Commonly known as:  SYMBICORT  Inhale 2 puffs into the lungs 2 (two) times daily.     buPROPion 300 MG 24 hr tablet  Commonly known as:  WELLBUTRIN XL  Take 300 mg by mouth daily.     diclofenac 75 MG EC tablet  Commonly known as:  VOLTAREN  Take 75 mg by mouth 2 (two) times daily.     famotidine 20 MG tablet  Commonly known as:  PEPCID  Take 1 tablet (20 mg total) by mouth daily.     FLUoxetine 10 MG tablet  Commonly known as:  PROZAC  Take 10 mg by mouth daily.     furosemide 20 MG tablet   Commonly known as:  LASIX  Take 20 mg by mouth daily as needed for fluid or edema.     gabapentin 300 MG capsule  Commonly known as:  NEURONTIN  Take 300 mg by mouth 3 (three) times daily.     HYDROcodone-acetaminophen 10-325 MG per tablet  Commonly known as:  NORCO  Take 1 tablet by mouth every 6 (six) hours as needed for moderate pain.     ICAPS PO  Take 1 tablet by mouth daily.     multivitamin with minerals Tabs tablet  Take 1 tablet by mouth daily.     omega-3 acid ethyl esters 1 G capsule  Commonly known as:  LOVAZA  Take 1 g by mouth daily.       Allergies  Allergen Reactions  . Latex Itching and Swelling  . Doxycycline Rash       Follow-up Information   Follow up with Kristian Covey, MD. Schedule an appointment as soon as possible for a visit in 1 week.   Specialty:  Family Medicine   Contact information:   [address] [place] [address] [phone]        The results of significant diagnostics from this hospitalization (including imaging, microbiology, ancillary and laboratory) are listed below for reference.    Significant Diagnostic Studies: Dg Chest 2 View  07/30/2013   CLINICAL DATA:  History of syncope and fall now with weakness and lethargy and altered mental status  EXAM: CHEST  2 VIEW  COMPARISON:  Report of a study of August 09, 2012  FINDINGS: The lungs are adequately inflated. There is no focal infiltrate. There are mildly increased interstitial markings in the infrahilar regions bilaterally. By report there were similar findings on the study of August 04, 2012. There is no pleural effusion. There is no pneumothorax. The cardiopericardial silhouette is normal in size. The pulmonary vascularity is not engorged. There is mild tortuosity of the descending thoracic aorta. The observed portions of the bony thorax exhibit no acute abnormalities. There are mild degenerative disc changes of the lower thoracic spine.  IMPRESSION: 1. There  is no evidence of pneumonia nor pulmonary alveolar edema. 2. Minimal prominence of the interstitial markings may reflect subsegmental atelectasis. A portion of the findings may be related to previously noted chronic change.   Electronically Signed   By: David  [place]   On: 07/30/2013 18:05   Ct Head Wo Contrast  07/30/2013   CLINICAL DATA:  Fall, now with frontal headache  EXAM: CT HEAD WITHOUT CONTRAST  TECHNIQUE: Contiguous axial images were obtained from the base of the skull through the vertex without intravenous contrast.  COMPARISON:  Prior CT from earlier the same day.  FINDINGS: Mild age-related atrophy and chronic  microvascular ischemic disease is again noted. No acute intracranial hemorrhage or infarct identified. No extra-axial fluid collection. No mass or midline shift. No hydrocephalus.  Calvarium is intact. Hyperostosis frontalis interna is noted. Globes are intact. Paranasal sinuses and mastoid air cells are clear.  IMPRESSION: 1. No acute intracranial process. 2. Stable atrophy and chronic microvascular ischemic disease.   Electronically Signed   By: Rise Mu M.D.   On: 07/30/2013 23:11   Ct Head Wo Contrast  07/30/2013   CLINICAL DATA:  Status post fall.  EXAM: CT HEAD WITHOUT CONTRAST  CT CERVICAL SPINE WITHOUT CONTRAST  TECHNIQUE: Multidetector CT imaging of the head and cervical spine was performed following the standard protocol without intravenous contrast. Multiplanar CT image reconstructions of the cervical spine were also generated.  COMPARISON:  03/20/2011  FINDINGS: CT HEAD FINDINGS  There is no evidence of intra-axial no extra-axial fluid collections, nor acute hemorrhage. There is mild diffuse cortical atrophy. There is no evidence of subfalcine or tonsillar herniation. The pons and cerebellum are grossly unremarkable. The osseous structures demonstrate no evidence of depressed skull fracture. Visualized paranasal sinuses and mastoid air cells patent.  CT CERVICAL  SPINE FINDINGS  There is no evidence of acute fracture or dislocation. There is straightening of the normal cervical lordosis which may represent, placement, muscle spasm possibly positioning. Multilevel severe disc space narrowing is identified throughout the cervical spine as well as endplate sclerosis, peripheral endplate hypertrophic spurring, subchondral cyst formation, and multilevel vacuum disc. Multilevel facet sclerosis and hypertrophy identified.  IMPRESSION: 1. Mild involutional changes without evidence of focal or acute intracranial abnormalities 2. Multilevel severe spondylosis without evidence of acute osseous abnormalities.   Electronically Signed   By: Salome Holmes M.D.   On: 07/30/2013 17:51   Ct Cervical Spine Wo Contrast  07/30/2013   CLINICAL DATA:  Status post fall.  EXAM: CT HEAD WITHOUT CONTRAST  CT CERVICAL SPINE WITHOUT CONTRAST  TECHNIQUE: Multidetector CT imaging of the head and cervical spine was performed following the standard protocol without intravenous contrast. Multiplanar CT image reconstructions of the cervical spine were also generated.  COMPARISON:  03/20/2011  FINDINGS: CT HEAD FINDINGS  There is no evidence of intra-axial no extra-axial fluid collections, nor acute hemorrhage. There is mild diffuse cortical atrophy. There is no evidence of subfalcine or tonsillar herniation. The pons and cerebellum are grossly unremarkable. The osseous structures demonstrate no evidence of depressed skull fracture. Visualized paranasal sinuses and mastoid air cells patent.  CT CERVICAL SPINE FINDINGS  There is no evidence of acute fracture or dislocation. There is straightening of the normal cervical lordosis which may represent, placement, muscle spasm possibly positioning. Multilevel severe disc space narrowing is identified throughout the cervical spine as well as endplate sclerosis, peripheral endplate hypertrophic spurring, subchondral cyst formation, and multilevel vacuum disc.  Multilevel facet sclerosis and hypertrophy identified.  IMPRESSION: 1. Mild involutional changes without evidence of focal or acute intracranial abnormalities 2. Multilevel severe spondylosis without evidence of acute osseous abnormalities.   Electronically Signed   By: Salome Holmes M.D.   On: 07/30/2013 17:51   Mr Brain Wo Contrast  08/01/2013   CLINICAL DATA:  Stroke. Fall and headache. Confusion and unsteadiness with difficulty walking.  EXAM: MRI HEAD WITHOUT CONTRAST  TECHNIQUE: Multiplanar, multiecho pulse sequences of the brain and surrounding structures were obtained without intravenous contrast.  COMPARISON:  Head CT 07/30/2013 and brain MRI 08/11/2011  FINDINGS: Limited evaluation of the upper cervical spine demonstrates grade 1 anterolisthesis of  C2 on C3, unchanged.  Foci of T2 hyperintensity within the subcortical and deep cerebral white matter are similar to the prior exam and compatible with mild chronic small vessel ischemic disease. Extra-axial, calcified lesions overlying the right frontal lobe seen on the prior exam are unchanged and may reflect small meningiomas. There is no evidence of acute infarct, mass effect, midline shift, intracranial hemorrhage, or extra-axial fluid collection. Ventricles and sulci are within normal limits for age. Major intracranial vascular flow voids are unremarkable. Prior bilateral cataract surgery is noted. Visualized paranasal sinuses and mastoid air cells are clear.  IMPRESSION: No evidence of acute infarct or other acute intracranial abnormality.   Electronically Signed   By: Sebastian Ache   On: 08/01/2013 12:20    Microbiology: Recent Results (from the past 240 hour(s))  CLOSTRIDIUM DIFFICILE BY PCR     Status: None   Collection Time    07/31/13  3:13 PM      Result Value Range Status   C difficile by pcr NEGATIVE  NEGATIVE Final     Labs: Basic Metabolic Panel:  Recent Labs Lab 07/30/13 1646 07/31/13 0350  NA 141 141  K 4.1 3.6  CL 104  106  CO2 26 26  GLUCOSE 92 95  BUN 20 20  CREATININE 0.59 0.49*  CALCIUM 9.0 8.9   Liver Function Tests:  Recent Labs Lab 07/30/13 1646 07/31/13 0350  AST 38* 28  ALT 30 26  ALKPHOS 84 74  BILITOT 0.2* 0.3  PROT 6.8 6.2  ALBUMIN 3.7 3.4*   No results found for this basename: LIPASE, AMYLASE,  in the last 168 hours  Recent Labs Lab 07/31/13 0350  AMMONIA 30   CBC:  Recent Labs Lab 07/30/13 1646 07/31/13 0350  WBC 7.1 8.4  NEUTROABS 3.8 4.5  HGB 14.1 13.4  HCT 42.4 41.2  MCV 98.4 99.0  PLT 174 162   Cardiac Enzymes:  Recent Labs Lab 07/30/13 1646  CKTOTAL 214*   BNP: BNP (last 3 results) No results found for this basename: PROBNP,  in the last 8760 hours CBG: No results found for this basename: GLUCAP,  in the last 168 hours     Signed:  Benjamim Harnish  [facility] 08/01/2013, 6:16 PM

## 2013-08-03 LAB — GI PATHOGEN PANEL BY PCR, STOOL
C difficile toxin A/B: NEGATIVE
Campylobacter by PCR: NEGATIVE
Cryptosporidium by PCR: NEGATIVE
E coli (ETEC) LT/ST: NEGATIVE
E coli (STEC): NEGATIVE
E coli 0157 by PCR: NEGATIVE
G lamblia by PCR: NEGATIVE
Norovirus GI/GII: NEGATIVE
Rotavirus A by PCR: NEGATIVE
Salmonella by PCR: NEGATIVE
Shigella by PCR: NEGATIVE

## 2013-08-14 ENCOUNTER — Telehealth: Payer: Self-pay | Admitting: Family Medicine

## 2013-08-14 NOTE — Telephone Encounter (Signed)
Pt missed therapy all week due to bronchitis. When they walked during assessment, O2 dropped into the 80's. after rest 1-2 min, it went back up to 90's. Will resume tues 12/16

## 2013-08-20 ENCOUNTER — Telehealth: Payer: Self-pay | Admitting: Family Medicine

## 2013-08-20 NOTE — Telephone Encounter (Signed)
Per Ree Kida at [facility], Physical Therapist, states that when he is walking with pt her Oxygen level drops. Yesterday, prior to walking 92% and after walking 200 feet it drops to  78-80% level.

## 2013-08-20 NOTE — Telephone Encounter (Signed)
Informed Ree Kida that Dr. Caryl Never has not seen the patient in years and if he could rely the message to Darilyn, I called and left message on her Vm yesterday.

## 2015-12-21 ENCOUNTER — Telehealth: Payer: Self-pay | Admitting: *Deleted

## 2015-12-21 ENCOUNTER — Ambulatory Visit: Payer: Medicare Other | Admitting: Neurology

## 2015-12-21 NOTE — Telephone Encounter (Signed)
No showed new patient appointment. 

## 2015-12-22 ENCOUNTER — Encounter: Payer: Self-pay | Admitting: Neurology

## 2016-05-10 DIAGNOSIS — F419 Anxiety disorder, unspecified: Secondary | ICD-10-CM | POA: Insufficient documentation

## 2016-11-07 ENCOUNTER — Emergency Department (HOSPITAL_COMMUNITY): Payer: Medicare Other

## 2016-11-07 ENCOUNTER — Encounter (HOSPITAL_COMMUNITY): Payer: Self-pay

## 2016-11-07 ENCOUNTER — Emergency Department (HOSPITAL_COMMUNITY)
Admission: EM | Admit: 2016-11-07 | Discharge: 2016-11-07 | Disposition: A | Discharge status: Home / Self Care | Payer: Medicare Other | Attending: Emergency Medicine | Admitting: Emergency Medicine

## 2016-11-07 DIAGNOSIS — W1839XA Other fall on same level, initial encounter: Secondary | ICD-10-CM | POA: Insufficient documentation

## 2016-11-07 DIAGNOSIS — S52532A Colles' fracture of left radius, initial encounter for closed fracture: Secondary | ICD-10-CM | POA: Insufficient documentation

## 2016-11-07 DIAGNOSIS — S6992XA Unspecified injury of left wrist, hand and finger(s), initial encounter: Secondary | ICD-10-CM | POA: Diagnosis present

## 2016-11-07 DIAGNOSIS — J449 Chronic obstructive pulmonary disease, unspecified: Secondary | ICD-10-CM | POA: Diagnosis not present

## 2016-11-07 DIAGNOSIS — Y9301 Activity, walking, marching and hiking: Secondary | ICD-10-CM | POA: Diagnosis not present

## 2016-11-07 DIAGNOSIS — Y999 Unspecified external cause status: Secondary | ICD-10-CM | POA: Diagnosis not present

## 2016-11-07 DIAGNOSIS — R410 Disorientation, unspecified: Secondary | ICD-10-CM | POA: Insufficient documentation

## 2016-11-07 DIAGNOSIS — Y92000 Kitchen of unspecified non-institutional (private) residence as  the place of occurrence of the external cause: Secondary | ICD-10-CM | POA: Insufficient documentation

## 2016-11-07 DIAGNOSIS — S62102A Fracture of unspecified carpal bone, left wrist, initial encounter for closed fracture: Secondary | ICD-10-CM

## 2016-11-07 DIAGNOSIS — Z87891 Personal history of nicotine dependence: Secondary | ICD-10-CM | POA: Diagnosis not present

## 2016-11-07 DIAGNOSIS — W19XXXA Unspecified fall, initial encounter: Secondary | ICD-10-CM

## 2016-11-07 DIAGNOSIS — Y92009 Unspecified place in unspecified non-institutional (private) residence as the place of occurrence of the external cause: Secondary | ICD-10-CM

## 2016-11-07 LAB — COMPREHENSIVE METABOLIC PANEL
ALT: 16 U/L (ref 14–54)
AST: 23 U/L (ref 15–41)
Albumin: 3.4 g/dL — ABNORMAL LOW (ref 3.5–5.0)
Alkaline Phosphatase: 74 U/L (ref 38–126)
Anion gap: 7 (ref 5–15)
BUN: 12 mg/dL (ref 6–20)
CO2: 29 mmol/L (ref 22–32)
Calcium: 8.5 mg/dL — ABNORMAL LOW (ref 8.9–10.3)
Chloride: 100 mmol/L — ABNORMAL LOW (ref 101–111)
Creatinine, Ser: 0.61 mg/dL (ref 0.44–1.00)
GFR calc Af Amer: >60 mL/min (ref >60)
GFR calc non Af Amer: >60 mL/min (ref >60)
Glucose, Bld: 103 mg/dL — ABNORMAL HIGH (ref 65–99)
Potassium: 3.4 mmol/L — ABNORMAL LOW (ref 3.5–5.1)
Sodium: 136 mmol/L (ref 135–145)
Total Bilirubin: 0.6 mg/dL (ref 0.3–1.2)
Total Protein: 6.4 g/dL — ABNORMAL LOW (ref 6.5–8.1)

## 2016-11-07 LAB — URINALYSIS, ROUTINE W REFLEX MICROSCOPIC
Bacteria, UA: NONE SEEN
Bilirubin Urine: NEGATIVE
Glucose, UA: NEGATIVE mg/dL
Ketones, ur: NEGATIVE mg/dL
Leukocytes, UA: NEGATIVE
Nitrite: NEGATIVE
Protein, ur: NEGATIVE mg/dL
Specific Gravity, Urine: 1.011 (ref 1.005–1.030)
pH: 5 (ref 5.0–8.0)

## 2016-11-07 LAB — CBC WITH DIFFERENTIAL/PLATELET
Basophils Absolute: 0 10*3/uL (ref 0.0–0.1)
Basophils Relative: 0 %
Eosinophils Absolute: 0.1 10*3/uL (ref 0.0–0.7)
Eosinophils Relative: 2 %
HCT: 41.7 % (ref 36.0–46.0)
Hemoglobin: 13.6 g/dL (ref 12.0–15.0)
Lymphocytes Relative: 19 %
Lymphs Abs: 1.7 10*3/uL (ref 0.7–4.0)
MCH: 31.9 pg (ref 26.0–34.0)
MCHC: 32.6 g/dL (ref 30.0–36.0)
MCV: 97.7 fL (ref 78.0–100.0)
Monocytes Absolute: 0.7 10*3/uL (ref 0.1–1.0)
Monocytes Relative: 8 %
Neutro Abs: 6.6 10*3/uL (ref 1.7–7.7)
Neutrophils Relative %: 71 %
Platelets: 189 10*3/uL (ref 150–400)
RBC: 4.27 MIL/uL (ref 3.87–5.11)
RDW: 13.9 % (ref 11.5–15.5)
WBC: 9.2 10*3/uL (ref 4.0–10.5)

## 2016-11-07 LAB — ETHANOL: Alcohol, Ethyl (B): <5 mg/dL (ref <5)

## 2016-11-07 LAB — CK: Total CK: 233 U/L (ref 38–234)

## 2016-11-07 MED ORDER — OXYCODONE-ACETAMINOPHEN 5-325 MG PO TABS
1.0000 | ORAL_TABLET | Freq: Once | ORAL | Status: AC
  Administered 2016-11-07: 1 via ORAL
  Filled 2016-11-07: qty 1

## 2016-11-07 MED ORDER — OXYCODONE-ACETAMINOPHEN 5-325 MG PO TABS
ORAL_TABLET | ORAL | Status: AC
  Filled 2016-11-07: qty 1

## 2016-11-07 MED ORDER — LIDOCAINE-EPINEPHRINE (PF) 2 %-1:200000 IJ SOLN: 20.0000 mL | Freq: Once | INTRAMUSCULAR | Status: DC

## 2016-11-07 MED ORDER — LIDOCAINE-EPINEPHRINE (PF) 1 %-1:200000 IJ SOLN
INTRAMUSCULAR | Status: AC
  Administered 2016-11-07: 30 mL
  Filled 2016-11-07: qty 30

## 2016-11-07 MED ORDER — LIDOCAINE-EPINEPHRINE (PF) 2 %-1:200000 IJ SOLN
10.0000 mL | Freq: Once | INTRAMUSCULAR | Status: AC
  Administered 2016-11-07: 10 mL
  Filled 2016-11-07: qty 20

## 2016-11-07 MED ORDER — FENTANYL CITRATE (PF) 100 MCG/2ML IJ SOLN
50.0000 ug | Freq: Once | INTRAMUSCULAR | Status: AC
  Administered 2016-11-07: 50 ug via INTRAVENOUS
  Filled 2016-11-07: qty 2

## 2016-11-07 MED ORDER — POVIDONE-IODINE 10 % EX SOLN
CUTANEOUS | Status: AC
  Administered 2016-11-07: 09:00:00
  Filled 2016-11-07: qty 118

## 2016-11-07 MED ORDER — OXYCODONE-ACETAMINOPHEN 5-325 MG PO TABS
1.0000 | ORAL_TABLET | Freq: Once | ORAL | Status: AC
  Administered 2016-11-07: 1 via ORAL

## 2016-11-07 NOTE — Discharge Instructions (Signed)
Please call the orthopedic hand surgeon for follow up as you will likely require surgery on your wrist

## 2016-11-07 NOTE — ED Triage Notes (Addendum)
She fell in the kitchen and was found laying in the floor by EMS after she hit her medical alert button.  Patient has a deformity to her left wrist that was splinted by EMS.  Unknown how long she was laying in the floor.  She had urinated on herself and the turned the dog bowl of water over in the floor when she fell.  Blood sugar 185 by EMS.  Patient states that she got up yesterday morning and walked her dogs, her friend came over and brought her the mail.  Then yesterday afternoon a girlfriend took her shopping, she came home and ate dinner.  Then later a friend came over and they drank some wine.  He left to go eat dinner with his wife and she was going to bed around 2000.  She got up to go get a snack and never made it back to bed, due to falling.  She states that she laid in the floor trying to push her medical alert bracelet until this morning when she finally pushed it hard enough that EMS showed up.

## 2016-11-07 NOTE — ED Provider Notes (Signed)
[facility] DEPT Provider Note   CSN: [id number] Arrival date & time: 11/07/16  0401  Time seen 05:45 AM   History   Chief Complaint Chief Complaint  Patient presents with  . Fall    HPI Meagan Holt is a 76 y.o. female.  HPI  patient states after dark and she was walking around her house and thinks her oxygen tubing got caught on the door jam and it made her fall. She does not remember how she fell. She's not sure if she had loss of consciousness. She states however she did have pain in her left wrist. She was wearing a panic button however it was on her right wrist and she couldn't use her left arm to press the button. She states she did finally pressed the button hard enough to get EMS to arrive. She does not know how long she was on the floor before she got help. She states her only pain is in her left wrist. Patient is right-handed.She is on oxygen 2 lpm [place]  PCP Woody Seller, MD   Past Medical History:  Diagnosis Date  . Arthritis    "all my joints" (07/30/2013)  . Chronic bronchitis ([place])    "get it q year" (07/30/2013)  . COPD (chronic obstructive pulmonary disease) ([place])   . Falls frequently    "fell twice in the last 2 days" (07/30/2013)  . GERD (gastroesophageal reflux disease)    "rarely" (07/30/2013)  . H/O hiatal hernia   . History of blood transfusion   . Kidney stones     Patient Active Problem List   Diagnosis Date Noted  . Head injury 07/30/2013  . Difficulty walking 07/30/2013  . COPD (chronic obstructive pulmonary disease) ([place]) 07/30/2013    Past Surgical History:  Procedure Laterality Date  . APPENDECTOMY    . BACK SURGERY     "put cages in mid back; did arthroscopic OR on lower back" (11/326/2014)  . BILATERAL OOPHORECTOMY Bilateral   . CATARACT EXTRACTION W/ INTRAOCULAR LENS  IMPLANT, BILATERAL Bilateral   . CHOLECYSTECTOMY    . EXCISIONAL HEMORRHOIDECTOMY    . GASTRIC BYPASS    . HERNIA REPAIR     "removed my belly button too"  (07/30/2013)  . KIDNEY STONE SURGERY     "twice" (07/30/2013)  . KNEE ARTHROSCOPY Left   . TONSILLECTOMY      OB History    No data available       Home Medications    Prior to Admission medications   Medication Sig Start Date End Date Taking? Authorizing Provider  albuterol (PROVENTIL HFA;VENTOLIN HFA) 108 (90 BASE) MCG/ACT inhaler Inhale 1-2 puffs into the lungs every 6 (six) hours as needed for wheezing or shortness of breath.   Yes Historical Provider, MD  aspirin EC 81 MG tablet Take 1 tablet (81 mg total) by mouth daily. Patient taking differently: Take 325 mg by mouth daily.  08/01/13  Yes Hosie Poisson, MD  budesonide-formoterol (SYMBICORT) 160-4.5 MCG/ACT inhaler Inhale 2 puffs into the lungs 2 (two) times daily.   Yes Historical Provider, MD  famotidine (PEPCID) 20 MG tablet Take 1 tablet (20 mg total) by mouth daily. 08/01/13  Yes Hosie Poisson, MD  FLUoxetine (PROZAC) 10 MG tablet Take 10 mg by mouth daily.   Yes Historical Provider, MD  furosemide (LASIX) 20 MG tablet Take 20 mg by mouth daily as needed for fluid or edema.   Yes Historical Provider, MD  gabapentin (NEURONTIN) 300 MG capsule Take 300  mg by mouth 3 (three) times daily.   Yes Historical Provider, MD  HYDROcodone-acetaminophen (NORCO) 10-325 MG per tablet Take 1 tablet by mouth every 6 (six) hours as needed for moderate pain.   Yes Historical Provider, MD  Multiple Vitamin (MULTIVITAMIN WITH MINERALS) TABS tablet Take 1 tablet by mouth daily.   Yes Historical Provider, MD  Multiple Vitamins-Minerals (ICAPS PO) Take 1 tablet by mouth daily.   Yes Historical Provider, MD  omega-3 acid ethyl esters (LOVAZA) 1 G capsule Take 1 g by mouth daily.   Yes Historical Provider, MD  buPROPion (WELLBUTRIN XL) 300 MG 24 hr tablet Take 300 mg by mouth daily.    Historical Provider, MD  diclofenac (VOLTAREN) 75 MG EC tablet Take 75 mg by mouth 2 (two) times daily.    Historical Provider, MD    Family History Family History    Problem Relation Age of Onset  . Stomach cancer Father     Social History Social History  Substance Use Topics  . Smoking status: Former Smoker    Packs/day: 3.00    Years: 42.00    Types: Cigarettes    Quit date: 04/23/2012  . Smokeless tobacco: Never Used  . Alcohol use Yes     Comment: 07/30/2013 "socially; maybe have a drink twice/yr"  lives at home Lives alone   Allergies   Latex and Doxycycline   Review of Systems Review of Systems  All other systems reviewed and are negative.    Physical Exam Updated Vital Signs BP 126/86   Pulse 77   Temp 98.6 F (37 C) (Oral)   Resp 15   Ht 5\' 8"  (1.727 m)   Wt 201 lb (91.2 kg)   SpO2 98%   BMI 30.56 kg/m   Vital signs normal    Physical Exam  Constitutional: She is oriented to person, place, and time. She appears well-developed and well-nourished.  Non-toxic appearance. She does not appear ill. No distress.  HENT:  Head: Normocephalic and atraumatic.  Right Ear: External ear normal.  Left Ear: External ear normal.  Nose: Nose normal. No mucosal edema or rhinorrhea.  Mouth/Throat: Oropharynx is clear and moist and mucous membranes are normal. No dental abscesses or uvula swelling.  Eyes: Conjunctivae and EOM are normal. Pupils are equal, round, and reactive to light.  Neck: Normal range of motion and full passive range of motion without pain. Neck supple.  Pt has some tenderness in her cervical spine to palpation  Cardiovascular: Normal rate, regular rhythm and normal heart sounds.  Exam reveals no gallop and no friction rub.   No murmur heard. Pulmonary/Chest: Effort normal and breath sounds normal. No respiratory distress. She has no wheezes. She has no rhonchi. She has no rales. She exhibits no tenderness and no crepitus.  Abdominal: Soft. Normal appearance and bowel sounds are normal. She exhibits no distension. There is no tenderness. There is no rebound and no guarding.  Musculoskeletal: She exhibits edema,  tenderness and deformity.  Pt has deformity and swelling of her left wrist. She has a bracelet on her left wrist that was removed by me because it was getting tight. It was placed in her purse. She has intact pulses. She has some ROM of her fingers.  Her knees are nontender to palpation.  No hip pain noted.   Neurological: She is alert and oriented to person, place, and time. She has normal strength. No cranial nerve deficit.  Skin: Skin is warm, dry and intact. No rash  noted. No erythema. No pallor.  Psychiatric: She has a normal mood and affect. Her speech is normal and behavior is normal. Her mood appears not anxious.  Nursing note and vitals reviewed.    ED Treatments / Results  Labs (all labs ordered are listed, but only abnormal results are displayed) Results for orders placed or performed during the hospital encounter of 11/07/16  CBC with Differential  Result Value Ref Range   WBC 9.2 4.0 - 10.5 K/uL   RBC 4.27 3.87 - 5.11 MIL/uL   Hemoglobin 13.6 12.0 - 15.0 g/dL   HCT 41.7 36.0 - 46.0 %   MCV 97.7 78.0 - 100.0 fL   MCH 31.9 26.0 - 34.0 pg   MCHC 32.6 30.0 - 36.0 g/dL   RDW 13.9 11.5 - 15.5 %   Platelets 189 150 - 400 K/uL   Neutrophils Relative % 71 %   Neutro Abs 6.6 1.7 - 7.7 K/uL   Lymphocytes Relative 19 %   Lymphs Abs 1.7 0.7 - 4.0 K/uL   Monocytes Relative 8 %   Monocytes Absolute 0.7 0.1 - 1.0 K/uL   Eosinophils Relative 2 %   Eosinophils Absolute 0.1 0.0 - 0.7 K/uL   Basophils Relative 0 %   Basophils Absolute 0.0 0.0 - 0.1 K/uL  Urinalysis, Routine w reflex microscopic  Result Value Ref Range   Color, Urine YELLOW YELLOW   APPearance CLEAR CLEAR   Specific Gravity, Urine 1.011 1.005 - 1.030   pH 5.0 5.0 - 8.0   Glucose, UA NEGATIVE NEGATIVE mg/dL   Hgb urine dipstick MODERATE (A) NEGATIVE   Bilirubin Urine NEGATIVE NEGATIVE   Ketones, ur NEGATIVE NEGATIVE mg/dL   Protein, ur NEGATIVE NEGATIVE mg/dL   Nitrite NEGATIVE NEGATIVE   Leukocytes, UA NEGATIVE  NEGATIVE   RBC / HPF 6-30 0 - 5 RBC/hpf   WBC, UA 0-5 0 - 5 WBC/hpf   Bacteria, UA NONE SEEN NONE SEEN   Laboratory interpretation all normal except hematuria    EKG  EKG Interpretation None       Radiology Dg Wrist Complete Left  Result Date: 11/07/2016 CLINICAL DATA:  Pain and deformity to the left wrist after falling this morning. EXAM: LEFT WRIST - COMPLETE 3+ VIEW COMPARISON:  None. FINDINGS: There is a mildly comminuted transverse fracture of the distal radius with dorsal angulation and displacement. No dislocation. IMPRESSION: Acute distal radius fracture with displacement and angulation. Electronically Signed   By: Andreas Newport M.D.   On: 11/07/2016 06:45   Ct Head Wo Contrast  Ct Cervical Spine Wo Contrast  Result Date: 11/07/2016 CLINICAL DATA:  Confusion.  Fell at home today. EXAM: CT HEAD WITHOUT CONTRAST CT CERVICAL SPINE WITHOUT CONTRAST TECHNIQUE: Multidetector CT imaging of the head and cervical spine was performed following the standard protocol without intravenous contrast. Multiplanar CT image reconstructions of the cervical spine were also generated. COMPARISON:  09/27/2015, 07/30/2013 FINDINGS: CT HEAD FINDINGS Brain: There is no intracranial hemorrhage, mass or evidence of acute infarction. There is mild generalized atrophy. Good preservation of gray-white differentiation. There is no significant extra-axial fluid collection. No acute intracranial findings are evident. Vascular: No hyperdense vessel or unexpected calcification. Skull: Normal. Negative for fracture or focal lesion. Sinuses/Orbits: No acute finding. Other: None. CT CERVICAL SPINE FINDINGS Alignment: Degenerative anterolisthesis at C2-3, unchanged. No traumatic malalignment. Skull base and vertebrae: No acute fracture. No primary bone lesion or focal pathologic process. Soft tissues and spinal canal: No prevertebral fluid or swelling. No visible  canal hematoma. Disc levels: Severe degenerative cervical  disc disease from C3 through T1. Moderate facet arthritis. No traumatic disruption of facet articulations. Upper chest: No acute findings. Other: None IMPRESSION: 1. Mild generalized brain atrophy.  Otherwise normal brain. 2. Severe degenerative cervical disc disease. Negative for acute cervical spine fracture. Electronically Signed   By: Andreas Newport M.D.   On: 11/07/2016 06:50    Procedures Procedures (including critical care time)  Medications Ordered in ED Medications  lidocaine-EPINEPHrine (XYLOCAINE W/EPI) 2 %-1:200000 (PF) injection 10 mL (not administered)  fentaNYL (SUBLIMAZE) injection 50 mcg (50 mcg Intravenous Given 11/07/16 0602)     Initial Impression / Assessment and Plan / ED Course  I have reviewed the triage vital signs and the nursing notes.  Pertinent labs & imaging results that were available during my care of the patient were reviewed by me and considered in my medical decision making (see chart for details).  Pt was given IV pain meds. She was sent for xray and CT scans of her head and neck.   Pt was placed in a sugar tong splint.   07:55 AM lab is drawing her blood now.   08:30 Dr Venora Maples will check her labs and make disposition  Final Clinical Impressions(s) / ED Diagnoses   Final diagnoses:  Fall at home, initial encounter  Closed Colles' fracture of left radius, initial encounter    Disposition pending  Rolland Porter, MD, Barbette Or, MD 11/07/16 [phone]

## 2016-11-07 NOTE — Care Management (Signed)
CM consult received for [facility] needs. Pt from home, lives alone with neighbors who check on her. She has HS oxygen she wears PRN. She has cane and walker she uses as needed. She has fractured her radius. She will be ordered [facility] RN, aid, PT , SW. Pt agreeable and has used [facility] in the past. She is aware HH has 48hrs to make first visit. Romualdo Bolk, of [facility], aware of referral and will obtain pt info from chart.

## 2016-11-07 NOTE — ED Provider Notes (Signed)
Reduction of Fracture Performed by: Hoy Morn Consent: Verbal consent obtained. Risks and benefits: risks, benefits and alternatives were discussed Consent given by: patient Required items: required blood products, implants, devices, and special equipment available Time out: Immediately prior to procedure a "time out" was called to verify the correct patient, procedure, equipment, support staff and site/side marked as required. Patient sedated: no Vitals: Vital signs were monitored during sedation. Patient tolerance: Patient tolerated the procedure well with no immediate complications. Bone: Distal left Radius Reduction technique: manipulation and fingertraps   SPLINT APPLICATION Authorized by: Hoy Morn Consent: Verbal consent obtained. Risks and benefits: risks, benefits and alternatives were discussed Consent given by: patient Splint applied by: nurse and myself Location details: left UE Splint type: sugartong Supplies used: orthoglass Post-procedure: The splinted body part was neurovascularly unchanged following the procedure. Patient tolerance: Patient tolerated the procedure well with no immediate complications.   ++++++++++++++++++++++++++++++++++++  Patient is ambulatory in the emergency department.  Discharge home with orthopedic hand surgery follow-up as she will likely require operative repair for distal left radius fracture.  Slight improvement in angulation and displacement after reduction at the bedside.  Patient understands return to the ER for new or worsening symptoms.     Jola Schmidt, MD 11/07/16 1014

## 2016-11-07 NOTE — ED Notes (Signed)
Patient resting with c/o left wrist pain. Asking for a soda.

## 2016-11-07 NOTE — ED Notes (Signed)
Pt denies sob. States wears 02 at night. Pt placed on 2L [place] 02 at this time due to sat 90%

## 2016-11-21 ENCOUNTER — Other Ambulatory Visit: Payer: Self-pay | Admitting: Orthopedic Surgery

## 2016-11-23 ENCOUNTER — Other Ambulatory Visit (HOSPITAL_COMMUNITY): Payer: Self-pay | Admitting: Family Medicine

## 2016-11-23 ENCOUNTER — Other Ambulatory Visit: Payer: Self-pay | Admitting: Family Medicine

## 2016-11-23 DIAGNOSIS — E2839 Other primary ovarian failure: Secondary | ICD-10-CM

## 2016-11-28 DIAGNOSIS — M25639 Stiffness of unspecified wrist, not elsewhere classified: Secondary | ICD-10-CM | POA: Insufficient documentation

## 2016-11-28 DIAGNOSIS — S52502K Unspecified fracture of the lower end of left radius, subsequent encounter for closed fracture with nonunion: Secondary | ICD-10-CM | POA: Insufficient documentation

## 2016-12-14 ENCOUNTER — Ambulatory Visit (HOSPITAL_BASED_OUTPATIENT_CLINIC_OR_DEPARTMENT_OTHER): Admission: RE | Admit: 2016-12-14 | Payer: Medicare Other | Source: Ambulatory Visit | Admitting: Orthopedic Surgery

## 2016-12-14 ENCOUNTER — Encounter (HOSPITAL_BASED_OUTPATIENT_CLINIC_OR_DEPARTMENT_OTHER): Admission: RE | Payer: Self-pay | Source: Ambulatory Visit

## 2016-12-14 SURGERY — OPEN REDUCTION INTERNAL FIXATION (ORIF) DISTAL RADIUS FRACTURE
Anesthesia: Choice | Laterality: Left

## 2016-12-21 ENCOUNTER — Other Ambulatory Visit (HOSPITAL_COMMUNITY): Payer: Medicare Other

## 2016-12-21 ENCOUNTER — Encounter (HOSPITAL_COMMUNITY): Payer: Self-pay

## 2016-12-25 ENCOUNTER — Ambulatory Visit (HOSPITAL_COMMUNITY): Payer: Medicare Other | Attending: Orthopedic Surgery | Admitting: Physical Therapy

## 2016-12-25 DIAGNOSIS — S60852S Superficial foreign body of left wrist, sequela: Secondary | ICD-10-CM

## 2016-12-25 DIAGNOSIS — L089 Local infection of the skin and subcutaneous tissue, unspecified: Secondary | ICD-10-CM

## 2016-12-25 NOTE — Therapy (Signed)
[facility] [place], [place], [address] Phone: [phone]   Fax:  [phone]  Patient Details  Name: Meagan Holt MRN: [id number] Date of Birth: September 09, 1940 Referring Provider:  Leanora Cover, MD  Encounter Date: 12/25/2016    Therapist learned upon interview that the pt is receiving home health services.  Medicare will not allow out-patient and home health services at the same time. MD office notified.  Nurse stated that they would transfer wound care to home health services.  Pt will not be seen in this clinic.   Rayetta Humphrey, PT CLT [phone] 12/25/2016, 11:11 AM  [place], [place], [address] Phone: [phone]   Fax:  [phone]

## 2017-01-05 ENCOUNTER — Other Ambulatory Visit (HOSPITAL_COMMUNITY)
Admission: RE | Admit: 2017-01-05 | Discharge: 2017-01-05 | Disposition: A | Discharge status: Home / Self Care | Payer: Medicare Other | Source: Other Acute Inpatient Hospital | Attending: Family Medicine | Admitting: Family Medicine

## 2017-01-05 DIAGNOSIS — N390 Urinary tract infection, site not specified: Secondary | ICD-10-CM | POA: Insufficient documentation

## 2017-01-05 LAB — URINALYSIS, ROUTINE W REFLEX MICROSCOPIC
Bacteria, UA: NONE SEEN
Bilirubin Urine: NEGATIVE
Glucose, UA: NEGATIVE mg/dL
Ketones, ur: NEGATIVE mg/dL
Leukocytes, UA: NEGATIVE
Nitrite: NEGATIVE
Protein, ur: NEGATIVE mg/dL
Specific Gravity, Urine: 1.016 (ref 1.005–1.030)
pH: 5 (ref 5.0–8.0)

## 2017-01-07 LAB — URINE CULTURE

## 2017-05-15 DIAGNOSIS — M47812 Spondylosis without myelopathy or radiculopathy, cervical region: Secondary | ICD-10-CM | POA: Insufficient documentation

## 2017-05-15 DIAGNOSIS — M47816 Spondylosis without myelopathy or radiculopathy, lumbar region: Secondary | ICD-10-CM | POA: Insufficient documentation

## 2017-05-20 ENCOUNTER — Emergency Department (HOSPITAL_COMMUNITY): Payer: Medicare Other

## 2017-05-20 ENCOUNTER — Encounter (HOSPITAL_COMMUNITY): Payer: Self-pay | Admitting: Emergency Medicine

## 2017-05-20 ENCOUNTER — Observation Stay (HOSPITAL_COMMUNITY)
Admission: EM | Admit: 2017-05-20 | Discharge: 2017-05-22 | Disposition: A | Discharge status: Home / Self Care | Payer: Medicare Other | Attending: Internal Medicine | Admitting: Internal Medicine

## 2017-05-20 DIAGNOSIS — J449 Chronic obstructive pulmonary disease, unspecified: Secondary | ICD-10-CM | POA: Diagnosis not present

## 2017-05-20 DIAGNOSIS — Z7982 Long term (current) use of aspirin: Secondary | ICD-10-CM | POA: Diagnosis not present

## 2017-05-20 DIAGNOSIS — I951 Orthostatic hypotension: Secondary | ICD-10-CM

## 2017-05-20 DIAGNOSIS — F329 Major depressive disorder, single episode, unspecified: Secondary | ICD-10-CM | POA: Diagnosis present

## 2017-05-20 DIAGNOSIS — I4949 Other premature depolarization: Secondary | ICD-10-CM | POA: Diagnosis present

## 2017-05-20 DIAGNOSIS — G9340 Encephalopathy, unspecified: Secondary | ICD-10-CM | POA: Diagnosis not present

## 2017-05-20 DIAGNOSIS — G894 Chronic pain syndrome: Secondary | ICD-10-CM

## 2017-05-20 DIAGNOSIS — R4182 Altered mental status, unspecified: Secondary | ICD-10-CM | POA: Diagnosis not present

## 2017-05-20 DIAGNOSIS — Z79899 Other long term (current) drug therapy: Secondary | ICD-10-CM | POA: Diagnosis not present

## 2017-05-20 DIAGNOSIS — R55 Syncope and collapse: Secondary | ICD-10-CM | POA: Diagnosis not present

## 2017-05-20 DIAGNOSIS — Z8673 Personal history of transient ischemic attack (TIA), and cerebral infarction without residual deficits: Secondary | ICD-10-CM

## 2017-05-20 DIAGNOSIS — F32A Depression, unspecified: Secondary | ICD-10-CM | POA: Diagnosis present

## 2017-05-20 DIAGNOSIS — Z87891 Personal history of nicotine dependence: Secondary | ICD-10-CM | POA: Diagnosis not present

## 2017-05-20 DIAGNOSIS — Z91040 Latex allergy status: Secondary | ICD-10-CM | POA: Insufficient documentation

## 2017-05-20 DIAGNOSIS — K219 Gastro-esophageal reflux disease without esophagitis: Secondary | ICD-10-CM | POA: Diagnosis present

## 2017-05-20 HISTORY — DX: Dorsalgia, unspecified: M54.9

## 2017-05-20 HISTORY — DX: Other chronic pain: G89.29

## 2017-05-20 HISTORY — DX: Depression, unspecified: F32.A

## 2017-05-20 HISTORY — DX: Cerebral infarction, unspecified: I63.9

## 2017-05-20 HISTORY — DX: Major depressive disorder, single episode, unspecified: F32.9

## 2017-05-20 LAB — LACTIC ACID, PLASMA
Lactic Acid, Venous: 0.9 mmol/L (ref 0.5–1.9)
Lactic Acid, Venous: 0.8 mmol/L (ref 0.5–1.9)

## 2017-05-20 LAB — CBC WITH DIFFERENTIAL/PLATELET
Basophils Absolute: 0 10*3/uL (ref 0.0–0.1)
Basophils Relative: 1 %
Eosinophils Absolute: 0.1 10*3/uL (ref 0.0–0.7)
Eosinophils Relative: 1 %
HCT: 39.4 % (ref 36.0–46.0)
Hemoglobin: 12.9 g/dL (ref 12.0–15.0)
Lymphocytes Relative: 33 %
Lymphs Abs: 1.9 10*3/uL (ref 0.7–4.0)
MCH: 32 pg (ref 26.0–34.0)
MCHC: 32.7 g/dL (ref 30.0–36.0)
MCV: 97.8 fL (ref 78.0–100.0)
Monocytes Absolute: 0.4 10*3/uL (ref 0.1–1.0)
Monocytes Relative: 7 %
Neutro Abs: 3.4 10*3/uL (ref 1.7–7.7)
Neutrophils Relative %: 58 %
Platelets: 239 10*3/uL (ref 150–400)
RBC: 4.03 MIL/uL (ref 3.87–5.11)
RDW: 14.8 % (ref 11.5–15.5)
WBC: 5.8 10*3/uL (ref 4.0–10.5)

## 2017-05-20 LAB — URINALYSIS, ROUTINE W REFLEX MICROSCOPIC
Bilirubin Urine: NEGATIVE
Glucose, UA: NEGATIVE mg/dL
Hgb urine dipstick: NEGATIVE
Ketones, ur: NEGATIVE mg/dL
Leukocytes, UA: NEGATIVE
Nitrite: NEGATIVE
Protein, ur: NEGATIVE mg/dL
Specific Gravity, Urine: 1.01 (ref 1.005–1.030)
pH: 7 (ref 5.0–8.0)

## 2017-05-20 LAB — COMPREHENSIVE METABOLIC PANEL
ALT: 15 U/L (ref 14–54)
AST: 19 U/L (ref 15–41)
Albumin: 3.3 g/dL — ABNORMAL LOW (ref 3.5–5.0)
Alkaline Phosphatase: 62 U/L (ref 38–126)
Anion gap: 8 (ref 5–15)
BUN: 9 mg/dL (ref 6–20)
CO2: 29 mmol/L (ref 22–32)
Calcium: 8.9 mg/dL (ref 8.9–10.3)
Chloride: 100 mmol/L — ABNORMAL LOW (ref 101–111)
Creatinine, Ser: 0.65 mg/dL (ref 0.44–1.00)
GFR calc Af Amer: >60 mL/min (ref >60)
GFR calc non Af Amer: >60 mL/min (ref >60)
Glucose, Bld: 90 mg/dL (ref 65–99)
Potassium: 4.2 mmol/L (ref 3.5–5.1)
Sodium: 137 mmol/L (ref 135–145)
Total Bilirubin: 0.5 mg/dL (ref 0.3–1.2)
Total Protein: 6.5 g/dL (ref 6.5–8.1)

## 2017-05-20 LAB — TROPONIN I
Troponin I: <0.03 ng/mL (ref <0.03)
Troponin I: <0.03 ng/mL (ref <0.03)

## 2017-05-20 LAB — PHOSPHORUS: Phosphorus: 3.7 mg/dL (ref 2.5–4.6)

## 2017-05-20 LAB — RAPID URINE DRUG SCREEN, HOSP PERFORMED
Amphetamines: NOT DETECTED
Barbiturates: NOT DETECTED
Benzodiazepines: NOT DETECTED
Cocaine: NOT DETECTED
Opiates: POSITIVE — AB
Tetrahydrocannabinol: NOT DETECTED

## 2017-05-20 LAB — CK: Total CK: 31 U/L — ABNORMAL LOW (ref 38–234)

## 2017-05-20 LAB — ACETAMINOPHEN LEVEL: Acetaminophen (Tylenol), Serum: <10 ug/mL — ABNORMAL LOW (ref 10–30)

## 2017-05-20 LAB — ETHANOL: Alcohol, Ethyl (B): <5 mg/dL (ref <5)

## 2017-05-20 LAB — SALICYLATE LEVEL: Salicylate Lvl: <7 mg/dL (ref 2.8–30.0)

## 2017-05-20 LAB — MAGNESIUM: Magnesium: 1.9 mg/dL (ref 1.7–2.4)

## 2017-05-20 MED ORDER — FUROSEMIDE 20 MG PO TABS: 20.0000 mg | ORAL_TABLET | Freq: Every day | ORAL | Status: DC | PRN

## 2017-05-20 MED ORDER — SODIUM CHLORIDE 0.9 % IV SOLN
INTRAVENOUS | Status: DC
  Administered 2017-05-20: 1000 mL via INTRAVENOUS
  Administered 2017-05-20: 23:00:00 via INTRAVENOUS

## 2017-05-20 MED ORDER — ALBUTEROL SULFATE (2.5 MG/3ML) 0.083% IN NEBU: 3.0000 mL | INHALATION_SOLUTION | Freq: Four times a day (QID) | RESPIRATORY_TRACT | Status: DC | PRN

## 2017-05-20 MED ORDER — FLUOXETINE HCL 10 MG PO CAPS
10.0000 mg | ORAL_CAPSULE | Freq: Every day | ORAL | Status: DC
  Administered 2017-05-21 – 2017-05-22 (×2): 10 mg via ORAL
  Filled 2017-05-20 (×2): qty 1

## 2017-05-20 MED ORDER — IPRATROPIUM-ALBUTEROL 0.5-2.5 (3) MG/3ML IN SOLN
3.0000 mL | Freq: Four times a day (QID) | RESPIRATORY_TRACT | Status: DC | PRN
  Administered 2017-05-20: 3 mL via RESPIRATORY_TRACT
  Filled 2017-05-20: qty 3

## 2017-05-20 MED ORDER — IPRATROPIUM BROMIDE 0.02 % IN SOLN: 0.5000 mg | Freq: Four times a day (QID) | RESPIRATORY_TRACT | Status: DC | PRN

## 2017-05-20 MED ORDER — ASPIRIN EC 325 MG PO TBEC
325.0000 mg | DELAYED_RELEASE_TABLET | Freq: Every day | ORAL | Status: DC
  Administered 2017-05-21 – 2017-05-22 (×2): 325 mg via ORAL
  Filled 2017-05-20 (×2): qty 1

## 2017-05-20 MED ORDER — HEPARIN SODIUM (PORCINE) 5000 UNIT/ML IJ SOLN
5000.0000 [IU] | Freq: Three times a day (TID) | INTRAMUSCULAR | Status: DC
  Administered 2017-05-20 – 2017-05-22 (×6): 5000 [IU] via SUBCUTANEOUS
  Filled 2017-05-20 (×6): qty 1

## 2017-05-20 MED ORDER — MAGNESIUM SULFATE 2 GM/50ML IV SOLN
2.0000 g | Freq: Once | INTRAVENOUS | Status: AC
  Administered 2017-05-20: 2 g via INTRAVENOUS
  Filled 2017-05-20: qty 50

## 2017-05-20 MED ORDER — HYDROCODONE-ACETAMINOPHEN 10-325 MG PO TABS
1.0000 | ORAL_TABLET | Freq: Four times a day (QID) | ORAL | Status: DC | PRN
  Administered 2017-05-21 – 2017-05-22 (×5): 1 via ORAL
  Filled 2017-05-20 (×5): qty 1

## 2017-05-20 MED ORDER — GABAPENTIN 300 MG PO CAPS
600.0000 mg | ORAL_CAPSULE | Freq: Three times a day (TID) | ORAL | Status: DC
  Administered 2017-05-20 – 2017-05-22 (×6): 600 mg via ORAL
  Filled 2017-05-20 (×6): qty 2

## 2017-05-20 MED ORDER — INFLUENZA VAC SPLIT HIGH-DOSE 0.5 ML IM SUSY
0.5000 mL | PREFILLED_SYRINGE | INTRAMUSCULAR | Status: AC
  Administered 2017-05-22: 0.5 mL via INTRAMUSCULAR
  Filled 2017-05-20 (×2): qty 0.5

## 2017-05-20 MED ORDER — MOMETASONE FURO-FORMOTEROL FUM 200-5 MCG/ACT IN AERO
2.0000 | INHALATION_SPRAY | Freq: Two times a day (BID) | RESPIRATORY_TRACT | Status: DC
  Administered 2017-05-20 – 2017-05-22 (×4): 2 via RESPIRATORY_TRACT
  Filled 2017-05-20 (×2): qty 8.8

## 2017-05-20 NOTE — H&P (Signed)
History and Physical    Meagan Holt TJQ:[id number] DOB: 11/15/1940 DOA: 05/20/2017  PCP: [name], MD   Patient coming from: Home.  I have personally briefly reviewed patient's old medical records in [place]  Chief Complaint: Altered mental status.  HPI: Meagan Holt is a 76 y.o. female with medical history significant of osteoarthritis, chronic back pain, COPD, depression, frequent falls, GERD, hiatal hernia, urolithiasis, history of CVA in 2014 who was brought to the emergency department via EMS after she had her lifeline wrist device activated and was found at home lying face down on the floor. She mentions that her allergy device may had been activated when she fell on the floor, since she does not remember activating it. EMS described that they found 2 went the bottles on the floor (one was unlabeled and was for zolpidem with crush pills on the bathroom counter. They also mentioned finding cigarettes, cigarette butts and ash on the floor, as well as an overturned ashtray.   She denies falling and does not remember any details of what may have happened. She endorses difficulty swallowing intermittently for the past few days. She denies fever, chills, headache, sore throat, productive cough, dyspnea, chest pain, palpitations, dizziness, diaphoresis, PND, orthopnea or lower extremity edema, abdominal pain, nausea, emesis, diarrhea, constipation, melena or hematochezia. Denies dysuria, frequency or hematuria. No polyuria, no polydipsia, no polyphagia or blurred vision. She denies numbness or focal weakness.  ED Course: Initial vital signs in the emergency department temperature 98.70F, pulse 69, blood pressure 150/93 mmHg, respirations 22 and O2 sat 98% on room air.  Her workup shows normal urinalysis, CBC, lactic acid, EtOH, acetaminophen, aspirin, troponin and total CK levels. Her CMP showed a decreased chloride at 100 mmol/L and albumin of 3.3 g/dL, but was otherwise  normal. Her urine drug screen was positive for opiates, but the patient takes hydrocodone for osteoarthritis and chronic back pain prescribed by Dr. Carloyn Manner.  Imaging: Chest radiograph, lumbar sacral spine, CT head and CT of the cervical spine did not show any acute abnormalities. However there are some chronic changes. Please see images sent for radiology report for further detail.  Review of Systems: As per HPI otherwise 10 point review of systems negative.    Past Medical History:  Diagnosis Date  . Arthritis    "all my joints" (07/30/2013)  . Chronic back pain   . Chronic bronchitis ([place])    "get it q year" (07/30/2013)  . COPD (chronic obstructive pulmonary disease) ([place])   . Depression   . Falls frequently    "fell twice in the last 2 days" (07/30/2013)  . GERD (gastroesophageal reflux disease)    "rarely" (07/30/2013)  . H/O hiatal hernia   . History of blood transfusion   . Kidney stones   . Stroke [facility])     Past Surgical History:  Procedure Laterality Date  . APPENDECTOMY    . BACK SURGERY     "put cages in mid back; did arthroscopic OR on lower back" (11/326/2014)  . BILATERAL OOPHORECTOMY Bilateral   . CATARACT EXTRACTION W/ INTRAOCULAR LENS  IMPLANT, BILATERAL Bilateral   . CHOLECYSTECTOMY    . EXCISIONAL HEMORRHOIDECTOMY    . GASTRIC BYPASS    . HERNIA REPAIR     "removed my belly button too" (07/30/2013)  . KIDNEY STONE SURGERY     "twice" (07/30/2013)  . KNEE ARTHROSCOPY Left   . TONSILLECTOMY       reports that she has  quit smoking. Her smoking use included Cigarettes. She has a 126.00 pack-year smoking history. She has never used smokeless tobacco. She reports that she drinks alcohol. She reports that she does not use drugs.  Allergies  Allergen Reactions  . Latex Itching and Swelling  . Doxycycline Rash    Family History  Problem Relation Age of Onset  . Stomach cancer Father     Prior to Admission medications   Medication Sig Start Date End  Date Taking? Authorizing Provider  albuterol (PROVENTIL HFA;VENTOLIN HFA) 108 (90 BASE) MCG/ACT inhaler Inhale 1-2 puffs into the lungs every 6 (six) hours as needed for wheezing or shortness of breath.   Yes [provider]  aspirin EC 81 MG tablet Take 1 tablet (81 mg total) by mouth daily. Patient taking differently: Take 325 mg by mouth daily.  08/01/13  Yes Hosie Poisson, MD  HYDROcodone-acetaminophen (NORCO) 10-325 MG per tablet Take 1 tablet by mouth every 6 (six) hours as needed for moderate pain.   Yes [provider]  Multiple Vitamin (MULTIVITAMIN WITH MINERALS) TABS tablet Take 1 tablet by mouth daily.   Yes [provider]  Multiple Vitamins-Minerals (ICAPS PO) Take 1 tablet by mouth daily.   Yes [provider]  Omega-3 Fatty Acids (FISH OIL) 1000 MG CAPS Take 3 capsules by mouth daily.   Yes [provider]  budesonide-formoterol (SYMBICORT) 160-4.5 MCG/ACT inhaler Inhale 2 puffs into the lungs 2 (two) times daily.    [provider]  buPROPion (WELLBUTRIN XL) 300 MG 24 hr tablet Take 300 mg by mouth daily.    [provider]  FLUoxetine (PROZAC) 10 MG tablet Take 10 mg by mouth daily.    [provider]  furosemide (LASIX) 20 MG tablet Take 20 mg by mouth daily as needed for fluid or edema.    [provider]  gabapentin (NEURONTIN) 300 MG capsule Take 600 mg by mouth 3 (three) times daily.     [provider]    Physical Exam: Vitals:   05/20/17 1911 05/20/17 1930 05/20/17 2030 05/20/17 2100  BP: 103/89 (!) 133/94 (!) 144/95 (!) 150/69  Pulse: 95 66 70 72  Resp: 19 (!) 22 (!) 22 (!) 23  Temp:      TempSrc:      SpO2: 93% 93% 91% 91%  Weight:      Height:        Constitutional: NAD, calm, comfortable Eyes: PERRL, lids and conjunctivae normal ENMT: Mucous membranes are Mildly dry. Posterior pharynx clear of any exudate or lesions. Neck: normal, supple, no masses, no  thyromegaly Respiratory: clear to auscultation bilaterally, no wheezing, no crackles. Normal respiratory effort. No accessory muscle use.  Cardiovascular: Regular rate and rhythm, no murmurs / rubs / gallops. No extremity edema. 2+ pedal pulses. No carotid bruits.  Abdomen: Soft, no tenderness, no masses palpated. No hepatosplenomegaly. Bowel sounds positive.  Musculoskeletal: no clubbing / cyanosis. Good ROM, no contractures. Normal muscle tone.  Skin: no rashes, lesions, ulcers on limited skin exam Neurologic: CN 2-12 grossly intact. Sensation intact, DTR normal. Strength 5/5 in all 4.  Psychiatric: No recollection of recent events. She had initial confusion when evaluated earlier by Dr. Thurnell Garbe, but otherwise apparent normal judgment and insight at this time. Alert and oriented x 4. She was able to respond to questions appropriately. No suicidal or homicidal thoughts. Mildly anxious mood.    Labs on Admission: I have personally reviewed following labs and imaging studies  CBC:  Recent Labs Lab 05/20/17 1417  WBC 5.8  NEUTROABS 3.4  HGB 12.9  HCT 39.4  MCV 97.8  PLT 166   Basic Metabolic Panel:  Recent Labs Lab 05/20/17 1417 05/20/17 2009  NA 137  --   K 4.2  --   CL 100*  --   CO2 29  --   GLUCOSE 90  --   BUN 9  --   CREATININE 0.65  --   CALCIUM 8.9  --   MG  --  1.9  PHOS  --  3.7   GFR: Estimated Creatinine Clearance: 67.4 mL/min (by C-G formula based on SCr of 0.65 mg/dL). Liver Function Tests:  Recent Labs Lab 05/20/17 1417  AST 19  ALT 15  ALKPHOS 62  BILITOT 0.5  PROT 6.5  ALBUMIN 3.3*   No results for input(s): LIPASE, AMYLASE in the last 168 hours. No results for input(s): AMMONIA in the last 168 hours. Coagulation Profile: No results for input(s): INR, PROTIME in the last 168 hours. Cardiac Enzymes:  Recent Labs Lab 05/20/17 1417 05/20/17 2009  CKTOTAL 31*  --   TROPONINI <0.03 <0.03   BNP (last 3 results) No results for input(s):  PROBNP in the last 8760 hours. HbA1C: No results for input(s): HGBA1C in the last 72 hours. CBG: No results for input(s): GLUCAP in the last 168 hours. Lipid Profile: No results for input(s): CHOL, HDL, LDLCALC, TRIG, CHOLHDL, LDLDIRECT in the last 72 hours. Thyroid Function Tests: No results for input(s): TSH, T4TOTAL, FREET4, T3FREE, THYROIDAB in the last 72 hours. Anemia Panel: No results for input(s): VITAMINB12, FOLATE, FERRITIN, TIBC, IRON, RETICCTPCT in the last 72 hours. Urine analysis:    Component Value Date/Time   COLORURINE STRAW (A) 05/20/2017 1617   APPEARANCEUR CLEAR 05/20/2017 1617   LABSPEC 1.010 05/20/2017 1617   PHURINE 7.0 05/20/2017 1617   GLUCOSEU NEGATIVE 05/20/2017 1617   HGBUR NEGATIVE 05/20/2017 1617   BILIRUBINUR NEGATIVE 05/20/2017 1617   KETONESUR NEGATIVE 05/20/2017 1617   PROTEINUR NEGATIVE 05/20/2017 1617   UROBILINOGEN 0.2 07/30/2013 1831   NITRITE NEGATIVE 05/20/2017 1617   LEUKOCYTESUR NEGATIVE 05/20/2017 1617    Radiological Exams on Admission: Dg Chest 2 View  Result Date: 05/20/2017 CLINICAL DATA:  Chronic back pain after fall. EXAM: CHEST  2 VIEW COMPARISON:  None. FINDINGS: Stable cardiomegaly. The hila, mediastinum, lungs, and pleura otherwise demonstrate no acute abnormalities. No focal infiltrate or overt edema. IMPRESSION: No active cardiopulmonary disease. Electronically Signed   By: Dorise Bullion III M.D   On: 05/20/2017 14:05   Dg Lumbar Spine Complete  Result Date: 05/20/2017 CLINICAL DATA:  Pain after fall EXAM: LUMBAR SPINE - COMPLETE 4+ VIEW COMPARISON:  CT scan March 05, 2012 and x-ray February 07, 2010 FINDINGS: Suture material seen in the abdomen. Scoliotic curvature of the lumbar spine, apex to the right is stable. No definitive fracture. Disc spacer devices seen at L3-4, L4-5, and L5-S1. The device at L5-S1 is stable. One of the 2 devices at L4-5 may be located 2 or 3 mm more posteriorly near the posterior endplate of L4 when  compared to the previous study. One of the 2 spacers at L3-4 may also have migrated 2 or 3 mm posteriorly. Neither of these 2 screws extends into the canal. Degenerative changes. Grade 1 retrolisthesis of L4 versus L5 persists. IMPRESSION: 1. No acute fracture or traumatic malalignment identified. Persistent grade 1 retrolisthesis of L4 versus L5. 2. 2 disc spacer devices are seen at L3-4  and L4-5. One of the 2 devices at each level appears to have migrated a few mm posteriorly but neither extend into the canal. Electronically Signed   By: Dorise Bullion III M.D   On: 05/20/2017 14:11   Ct Head Wo Contrast  Result Date: 05/20/2017 CLINICAL DATA:  Patient found laying face down on floor rate in the pill bottles on floor. Lethargy. Head trauma. Altered level of consciousness. EXAM: CT HEAD WITHOUT CONTRAST CT CERVICAL SPINE WITHOUT CONTRAST TECHNIQUE: Multidetector CT imaging of the head and cervical spine was performed following the standard protocol without intravenous contrast. Multiplanar CT image reconstructions of the cervical spine were also generated. COMPARISON:  11/07/2016 FINDINGS: CT HEAD FINDINGS Brain: The brainstem, cerebellum, cerebral peduncles, thalami, basal ganglia, basilar cisterns, and ventricular system appear within normal limits. No intracranial hemorrhage, mass lesion, or acute CVA. Vascular: There is atherosclerotic calcification of the cavernous carotid arteries bilaterally. Skull: Unremarkable Sinuses/Orbits: Unremarkable Other: No supplemental non-categorized findings. CT CERVICAL SPINE FINDINGS Alignment: 3 mm degenerative grade 1 anterolisthesis at C2- 3, no change from 11/07/2016. Straightening of the normal cervical lordosis. Skull base and vertebrae: Prominent loss of intervertebral disc height at all levels between C3 and T3. Multilevel posterior osseous ridging. Endplate sclerosis. Multilevel facet arthropathy. Pannus posterior to the odontoid. No appreciable fracture. No  significant change from the 11/07/2016 exam. Soft tissues and spinal canal: No prevertebral soft tissue swelling. Disc levels: Uncinate and facet spurring cause osseous foraminal stenosis bilaterally at C3-4, C4-5, C5-6, and C6-7. Upper chest: Unremarkable Other: No supplemental non-categorized findings. IMPRESSION: 1. No acute intracranial findings or acute cervical spine findings. 2. Stable cervical spondylosis and degenerative disc disease with multilevel impingement. 3. 3 mm of degenerative grade 1 anterolisthesis at C2- 3, unchanged from March 2018. Electronically Signed   By: Van Clines M.D.   On: 05/20/2017 13:45   Ct Cervical Spine Wo Contrast  Result Date: 05/20/2017 CLINICAL DATA:  Patient found laying face down on floor rate in the pill bottles on floor. Lethargy. Head trauma. Altered level of consciousness. EXAM: CT HEAD WITHOUT CONTRAST CT CERVICAL SPINE WITHOUT CONTRAST TECHNIQUE: Multidetector CT imaging of the head and cervical spine was performed following the standard protocol without intravenous contrast. Multiplanar CT image reconstructions of the cervical spine were also generated. COMPARISON:  11/07/2016 FINDINGS: CT HEAD FINDINGS Brain: The brainstem, cerebellum, cerebral peduncles, thalami, basal ganglia, basilar cisterns, and ventricular system appear within normal limits. No intracranial hemorrhage, mass lesion, or acute CVA. Vascular: There is atherosclerotic calcification of the cavernous carotid arteries bilaterally. Skull: Unremarkable Sinuses/Orbits: Unremarkable Other: No supplemental non-categorized findings. CT CERVICAL SPINE FINDINGS Alignment: 3 mm degenerative grade 1 anterolisthesis at C2- 3, no change from 11/07/2016. Straightening of the normal cervical lordosis. Skull base and vertebrae: Prominent loss of intervertebral disc height at all levels between C3 and T3. Multilevel posterior osseous ridging. Endplate sclerosis. Multilevel facet arthropathy. Pannus  posterior to the odontoid. No appreciable fracture. No significant change from the 11/07/2016 exam. Soft tissues and spinal canal: No prevertebral soft tissue swelling. Disc levels: Uncinate and facet spurring cause osseous foraminal stenosis bilaterally at C3-4, C4-5, C5-6, and C6-7. Upper chest: Unremarkable Other: No supplemental non-categorized findings. IMPRESSION: 1. No acute intracranial findings or acute cervical spine findings. 2. Stable cervical spondylosis and degenerative disc disease with multilevel impingement. 3. 3 mm of degenerative grade 1 anterolisthesis at C2- 3, unchanged from March 2018. Electronically Signed   By: Van Clines M.D.   On: 05/20/2017 13:45  Echo 07/31/2013 ------------------------------------------------------------ LV EF: 65% -  70%  ------------------------------------------------------------ Indications:   CVA 436.  ------------------------------------------------------------ History:  PMH: Difficulty walking. Head injury. Dyspnea. Chronic obstructive pulmonary disease.  ------------------------------------------------------------ Study Conclusions  - Left ventricle: The cavity size was normal. Wall thickness was increased in a pattern of mild LVH. Systolic function was vigorous. The estimated ejection fraction was in the range of 65% to 70%. Wall motion was normal; there were no regional wall motion abnormalities. Doppler parameters are consistent with abnormal left ventricular relaxation (grade 1 diastolic dysfunction). - Mitral valve: Calcified annulus. - Right atrium: The atrium was mildly to moderately dilated.  EKG: Independently reviewed. Vent. rate 64 BPM PR interval * ms QRS duration 98 ms QT/QTc 455/470 ms P-R-T axes 20 69 59 Undetermined heart rhythm Irregular rate.  Assessment/Plan Principal Problem:   Altered mental status Medication versus syncopal episode. However, it seems to be resolved  now. Continue telemetry monitoring. Neuro checks every 4 hours. Trend troponin levels. Check repeat EKG in the morning. Check carotid Doppler in a.m. Check echocardiogram in the morning  Active Problems:   COPD (chronic obstructive pulmonary disease) (HCC) Chest stable at this time. Continue maintenance inhaler. Supplemental oxygen and nebulized bronchodilators as needed.    GERD (gastroesophageal reflux disease) Protonix 40 mg by mouth daily.    Depression Continue fluoxetine 10 mg by mouth daily.    History of stroke Occasional difficulty swallowing. Check swallow screen.    Premature beats Supplemental magnesium ordered. Check repeat EKG in a.m. Check echocardiogram in a.m.   DVT prophylaxis: Heparin SQ. Code Status: Full code. Family Communication:  Disposition Plan: Overnight observation and further work up. Consults called:  Admission status: Observation/Telemetry   Reubin Milan MD [facility] Pager [phone].  If 7PM-7AM, please contact night-coverage [email or web address] Password [facility]  05/20/2017, 10:02 PM   This document was prepared using Dragon software and may have unintended transcription errors.

## 2017-05-20 NOTE — ED Provider Notes (Signed)
[facility] DEPT Provider Note   CSN: [id number] Arrival date & time: 05/20/17  1249     History   Chief Complaint Chief Complaint  Patient presents with  . Altered Mental Status    HPI Meagan Holt Meagan Holt is a 76 y.o. female.  The history is provided by the patient and the EMS personnel. The history is limited by the condition of the patient (confusion).  Altered Mental Status      Pt was seen at 1305. Per EMS and pt report: Pt found on the floor after activating her lifeline wrist device. Pt states she "might have" had a syncopal episode; pt does not recall events. EMS states on their arrival to scene, pt was lying face down on the floor, 2 empty pill bottles laying on the bedroom floor, crushed pills on the bathroom counter (one bottle was zolpidem the other was unlabeled), cigarettes on the floors with an overturned ash tray. EMS states pt was lethargic and unable to stand. CBG 114 en route. Pt herself denies any complaints s/p fall. Pt does state that she has been "having trouble swallowing" intermittently for the past several days. Endorses hx of chronic back pain which is managed by Dr. Carloyn Manner. Denies CP/palpitations, no SOB/cough, no abd pain, no N/V/D, no focal motor weakness, no tingling/numbness in extremities.   Past Medical History:  Diagnosis Date  . Arthritis    "all my joints" (07/30/2013)  . Chronic back pain   . Chronic bronchitis ([place])    "get it q year" (07/30/2013)  . COPD (chronic obstructive pulmonary disease) ([place])   . Falls frequently    "fell twice in the last 2 days" (07/30/2013)  . GERD (gastroesophageal reflux disease)    "rarely" (07/30/2013)  . H/O hiatal hernia   . History of blood transfusion   . Kidney stones     Patient Active Problem List   Diagnosis Date Noted  . Head injury 07/30/2013  . Difficulty walking 07/30/2013  . COPD (chronic obstructive pulmonary disease) ([place]) 07/30/2013    Past Surgical History:  Procedure Laterality Date    . APPENDECTOMY    . BACK SURGERY     "put cages in mid back; did arthroscopic OR on lower back" (11/326/2014)  . BILATERAL OOPHORECTOMY Bilateral   . CATARACT EXTRACTION W/ INTRAOCULAR LENS  IMPLANT, BILATERAL Bilateral   . CHOLECYSTECTOMY    . EXCISIONAL HEMORRHOIDECTOMY    . GASTRIC BYPASS    . HERNIA REPAIR     "removed my belly button too" (07/30/2013)  . KIDNEY STONE SURGERY     "twice" (07/30/2013)  . KNEE ARTHROSCOPY Left   . TONSILLECTOMY      OB History    No data available       Home Medications    Prior to Admission medications   Medication Sig Start Date End Date Taking? Authorizing Provider  albuterol (PROVENTIL HFA;VENTOLIN HFA) 108 (90 BASE) MCG/ACT inhaler Inhale 1-2 puffs into the lungs every 6 (six) hours as needed for wheezing or shortness of breath.    [provider]  aspirin EC 81 MG tablet Take 1 tablet (81 mg total) by mouth daily. Patient taking differently: Take 325 mg by mouth daily.  08/01/13   Hosie Poisson, MD  budesonide-formoterol (SYMBICORT) 160-4.5 MCG/ACT inhaler Inhale 2 puffs into the lungs 2 (two) times daily.    [provider]  buPROPion (WELLBUTRIN XL) 300 MG 24 hr tablet Take 300 mg by mouth daily.    [provider]  FLUoxetine (PROZAC) 10 MG tablet Take 10 mg by mouth daily.    [provider]  furosemide (LASIX) 20 MG tablet Take 20 mg by mouth daily as needed for fluid or edema.    [provider]  gabapentin (NEURONTIN) 300 MG capsule Take 600 mg by mouth 3 (three) times daily.     [provider]  HYDROcodone-acetaminophen (NORCO) 10-325 MG per tablet Take 1 tablet by mouth every 6 (six) hours as needed for moderate pain.    [provider]  Multiple Vitamin (MULTIVITAMIN WITH MINERALS) TABS tablet Take 1 tablet by mouth daily.    [provider]  Multiple Vitamins-Minerals (ICAPS PO) Take 1 tablet by mouth daily.    [provider]  omega-3 acid  ethyl esters (LOVAZA) 1 G capsule Take 1 g by mouth daily.    [provider]    Family History Family History  Problem Relation Age of Onset  . Stomach cancer Father     Social History Social History  Substance Use Topics  . Smoking status: Former Smoker    Packs/day: 3.00    Years: 42.00    Types: Cigarettes  . Smokeless tobacco: Never Used  . Alcohol use Yes     Comment: rarely      Allergies   Latex and Doxycycline   Review of Systems Review of Systems  Unable to perform ROS: Mental status change     Physical Exam Updated Vital Signs BP (!) 151/89   Pulse 64   Temp 98.5 F (36.9 C)   Resp 20   Ht 5\' 8"  (1.727 m)   Wt 88.5 kg (195 lb)   SpO2 94%   BMI 29.65 kg/m    16:31:09 Orthostatic Vital Signs CB  Orthostatic Lying   BP- Lying: 159/89  Pulse- Lying: 68      Orthostatic Sitting  BP- Sitting:  172/95  Pulse- Sitting: 67      Orthostatic Standing at 0 minutes  BP- Standing at 0 minutes:  137/93  Pulse- Standing at 0 minutes: 76      Physical Exam 1310: Physical examination:  Nursing notes reviewed; Vital signs and O2 SAT reviewed;  Constitutional: Well developed, Well nourished, In no acute distress; Head:  Normocephalic, atraumatic; Eyes: EOMI, PERRL, No scleral icterus; ENMT: Mouth and pharynx normal, Mucous membranes dry; Neck: Supple, Full range of motion, No lymphadenopathy; Cardiovascular: Regular rate and rhythm, No gallop; Respiratory: Breath sounds clear & equal bilaterally, No wheezes.  Speaking full sentences with ease, Normal respiratory effort/excursion; Chest: Nontender, Movement normal; Abdomen: Soft, Nontender, Nondistended, Normal bowel sounds; Genitourinary: No CVA tenderness; Spine:  No midline CS, TS, LS tenderness.;; Extremities: Pulses normal, Pelvis stable. No deformity. No tenderness, No edema, No calf edema or asymmetry.; Neuro: Awake, alert, confused re: events. Major CN grossly intact. Speech clear.  No facial  droop. Grips equal. Strength 5/5 equal bilat UE's and LE's.  DTR 2/4 equal bilat UE's and LE's.  No gross sensory deficits.  Normal cerebellar testing bilat UE's (finger-nose) and LE's (heel-shin).; Skin: Color normal, Warm, Dry.   ED Treatments / Results  Labs (all labs ordered are listed, but only abnormal results are displayed)   EKG  EKG Interpretation  Date/Time:  Sunday May 20 2017 12:55:37 EDT Ventricular Rate:  64 PR Interval:    QRS Duration: 98 QT Interval:  455 QTC Calculation: 470 R Axis:   69 Text Interpretation:  Sinus arrhythmia Artifact When compared with ECG of 07/30/2013 No  significant change was found Confirmed by Francine Graven [phone]) on 05/20/2017 1:42:22 PM       Radiology   Procedures Procedures (including critical care time)  Medications Ordered in ED Medications - No data to display   Initial Impression / Assessment and Plan / ED Course  I have reviewed the triage vital signs and the nursing notes.  Pertinent labs & imaging results that were available during my care of the patient were reviewed by me and considered in my medical decision making (see chart for details).  MDM Reviewed: previous chart, nursing note and vitals Reviewed previous: labs and ECG Interpretation: labs, ECG, x-ray and CT scan   Results for orders placed or performed during the hospital encounter of 05/20/17  Urine rapid drug screen (hosp performed)  Result Value Ref Range   Opiates POSITIVE (A) NONE DETECTED   Cocaine NONE DETECTED NONE DETECTED   Benzodiazepines NONE DETECTED NONE DETECTED   Amphetamines NONE DETECTED NONE DETECTED   Tetrahydrocannabinol NONE DETECTED NONE DETECTED   Barbiturates NONE DETECTED NONE DETECTED  Urinalysis, Routine w reflex microscopic  Result Value Ref Range   Color, Urine STRAW (A) YELLOW   APPearance CLEAR CLEAR   Specific Gravity, Urine 1.010 1.005 - 1.030   pH 7.0 5.0 - 8.0   Glucose, UA NEGATIVE NEGATIVE mg/dL    Hgb urine dipstick NEGATIVE NEGATIVE   Bilirubin Urine NEGATIVE NEGATIVE   Ketones, ur NEGATIVE NEGATIVE mg/dL   Protein, ur NEGATIVE NEGATIVE mg/dL   Nitrite NEGATIVE NEGATIVE   Leukocytes, UA NEGATIVE NEGATIVE  Comprehensive metabolic panel  Result Value Ref Range   Sodium 137 135 - 145 mmol/L   Potassium 4.2 3.5 - 5.1 mmol/L   Chloride 100 (L) 101 - 111 mmol/L   CO2 29 22 - 32 mmol/L   Glucose, Bld 90 65 - 99 mg/dL   BUN 9 6 - 20 mg/dL   Creatinine, Ser 0.65 0.44 - 1.00 mg/dL   Calcium 8.9 8.9 - 10.3 mg/dL   Total Protein 6.5 6.5 - 8.1 g/dL   Albumin 3.3 (L) 3.5 - 5.0 g/dL   AST 19 15 - 41 U/L   ALT 15 14 - 54 U/L   Alkaline Phosphatase 62 38 - 126 U/L   Total Bilirubin 0.5 0.3 - 1.2 mg/dL   GFR calc non Af Amer >60 >60 mL/min   GFR calc Af Amer >60 >60 mL/min   Anion gap 8 5 - 15  Ethanol  Result Value Ref Range   Alcohol, Ethyl (B) <5 <5 mg/dL  Acetaminophen level  Result Value Ref Range   Acetaminophen (Tylenol), Serum <10 (L) 10 - 30 ug/mL  Salicylate level  Result Value Ref Range   Salicylate Lvl <6.8 2.8 - 30.0 mg/dL  Troponin I  Result Value Ref Range   Troponin I <0.03 <0.03 ng/mL  Lactic acid, plasma  Result Value Ref Range   Lactic Acid, Venous 0.9 0.5 - 1.9 mmol/L  Lactic acid, plasma  Result Value Ref Range   Lactic Acid, Venous 0.8 0.5 - 1.9 mmol/L  CBC with Differential  Result Value Ref Range   WBC 5.8 4.0 - 10.5 K/uL   RBC 4.03 3.87 - 5.11 MIL/uL   Hemoglobin 12.9 12.0 - 15.0 g/dL   HCT 39.4 36.0 - 46.0 %   MCV 97.8 78.0 - 100.0 fL   MCH 32.0 26.0 - 34.0 pg   MCHC 32.7 30.0 - 36.0 g/dL   RDW 14.8 11.5 - 15.5 %  Platelets 239 150 - 400 K/uL   Neutrophils Relative % 58 %   Neutro Abs 3.4 1.7 - 7.7 K/uL   Lymphocytes Relative 33 %   Lymphs Abs 1.9 0.7 - 4.0 K/uL   Monocytes Relative 7 %   Monocytes Absolute 0.4 0.1 - 1.0 K/uL   Eosinophils Relative 1 %   Eosinophils Absolute 0.1 0.0 - 0.7 K/uL   Basophils Relative 1 %   Basophils  Absolute 0.0 0.0 - 0.1 K/uL  CK  Result Value Ref Range   Total CK 31 (L) 38 - 234 U/L   Dg Chest 2 View Result Date: 05/20/2017 CLINICAL DATA:  Chronic back pain after fall. EXAM: CHEST  2 VIEW COMPARISON:  None. FINDINGS: Stable cardiomegaly. The hila, mediastinum, lungs, and pleura otherwise demonstrate no acute abnormalities. No focal infiltrate or overt edema. IMPRESSION: No active cardiopulmonary disease. Electronically Signed   By: Dorise Bullion III M.D   On: 05/20/2017 14:05   Dg Lumbar Spine Complete Result Date: 05/20/2017 CLINICAL DATA:  Pain after fall EXAM: LUMBAR SPINE - COMPLETE 4+ VIEW COMPARISON:  CT scan March 05, 2012 and x-ray February 07, 2010 FINDINGS: Suture material seen in the abdomen. Scoliotic curvature of the lumbar spine, apex to the right is stable. No definitive fracture. Disc spacer devices seen at L3-4, L4-5, and L5-S1. The device at L5-S1 is stable. One of the 2 devices at L4-5 may be located 2 or 3 mm more posteriorly near the posterior endplate of L4 when compared to the previous study. One of the 2 spacers at L3-4 may also have migrated 2 or 3 mm posteriorly. Neither of these 2 screws extends into the canal. Degenerative changes. Grade 1 retrolisthesis of L4 versus L5 persists. IMPRESSION: 1. No acute fracture or traumatic malalignment identified. Persistent grade 1 retrolisthesis of L4 versus L5. 2. 2 disc spacer devices are seen at L3-4 and L4-5. One of the 2 devices at each level appears to have migrated a few mm posteriorly but neither extend into the canal. Electronically Signed   By: Dorise Bullion III M.D   On: 05/20/2017 14:11   Ct Head Wo Contrast Result Date: 05/20/2017 CLINICAL DATA:  Patient found laying face down on floor rate in the pill bottles on floor. Lethargy. Head trauma. Altered level of consciousness. EXAM: CT HEAD WITHOUT CONTRAST CT CERVICAL SPINE WITHOUT CONTRAST TECHNIQUE: Multidetector CT imaging of the head and cervical spine was performed  following the standard protocol without intravenous contrast. Multiplanar CT image reconstructions of the cervical spine were also generated. COMPARISON:  11/07/2016 FINDINGS: CT HEAD FINDINGS Brain: The brainstem, cerebellum, cerebral peduncles, thalami, basal ganglia, basilar cisterns, and ventricular system appear within normal limits. No intracranial hemorrhage, mass lesion, or acute CVA. Vascular: There is atherosclerotic calcification of the cavernous carotid arteries bilaterally. Skull: Unremarkable Sinuses/Orbits: Unremarkable Other: No supplemental non-categorized findings. CT CERVICAL SPINE FINDINGS Alignment: 3 mm degenerative grade 1 anterolisthesis at C2- 3, no change from 11/07/2016. Straightening of the normal cervical lordosis. Skull base and vertebrae: Prominent loss of intervertebral disc height at all levels between C3 and T3. Multilevel posterior osseous ridging. Endplate sclerosis. Multilevel facet arthropathy. Pannus posterior to the odontoid. No appreciable fracture. No significant change from the 11/07/2016 exam. Soft tissues and spinal canal: No prevertebral soft tissue swelling. Disc levels: Uncinate and facet spurring cause osseous foraminal stenosis bilaterally at C3-4, C4-5, C5-6, and C6-7. Upper chest: Unremarkable Other: No supplemental non-categorized findings. IMPRESSION: 1. No acute intracranial findings or acute cervical  spine findings. 2. Stable cervical spondylosis and degenerative disc disease with multilevel impingement. 3. 3 mm of degenerative grade 1 anterolisthesis at C2- 3, unchanged from March 2018. Electronically Signed   By: Van Clines M.D.   On: 05/20/2017 13:45   Ct Cervical Spine Wo Contrast Result Date: 05/20/2017 CLINICAL DATA:  Patient found laying face down on floor rate in the pill bottles on floor. Lethargy. Head trauma. Altered level of consciousness. EXAM: CT HEAD WITHOUT CONTRAST CT CERVICAL SPINE WITHOUT CONTRAST TECHNIQUE: Multidetector CT  imaging of the head and cervical spine was performed following the standard protocol without intravenous contrast. Multiplanar CT image reconstructions of the cervical spine were also generated. COMPARISON:  11/07/2016 FINDINGS: CT HEAD FINDINGS Brain: The brainstem, cerebellum, cerebral peduncles, thalami, basal ganglia, basilar cisterns, and ventricular system appear within normal limits. No intracranial hemorrhage, mass lesion, or acute CVA. Vascular: There is atherosclerotic calcification of the cavernous carotid arteries bilaterally. Skull: Unremarkable Sinuses/Orbits: Unremarkable Other: No supplemental non-categorized findings. CT CERVICAL SPINE FINDINGS Alignment: 3 mm degenerative grade 1 anterolisthesis at C2- 3, no change from 11/07/2016. Straightening of the normal cervical lordosis. Skull base and vertebrae: Prominent loss of intervertebral disc height at all levels between C3 and T3. Multilevel posterior osseous ridging. Endplate sclerosis. Multilevel facet arthropathy. Pannus posterior to the odontoid. No appreciable fracture. No significant change from the 11/07/2016 exam. Soft tissues and spinal canal: No prevertebral soft tissue swelling. Disc levels: Uncinate and facet spurring cause osseous foraminal stenosis bilaterally at C3-4, C4-5, C5-6, and C6-7. Upper chest: Unremarkable Other: No supplemental non-categorized findings. IMPRESSION: 1. No acute intracranial findings or acute cervical spine findings. 2. Stable cervical spondylosis and degenerative disc disease with multilevel impingement. 3. 3 mm of degenerative grade 1 anterolisthesis at C2- 3, unchanged from March 2018. Electronically Signed   By: Van Clines M.D.   On: 05/20/2017 13:45    1755:  Pt told ED RN that she has been having "trouble swallowing" intermittently for the past several days and that after a previous stroke she needed "thickened" fluids/food; therefore, cannot perform bedside swallow screen. IVF started.  Concern regarding living conditions, and pt unable to recall events today; will observation admit tonight and SW/CM will need to consult tomorrow (possibly also APS).  T/C to [facility] Dr. Olevia Bowens, case discussed, including:  HPI, pertinent PM/SHx, VS/PE, dx testing, ED course and treatment:  Agreeable to admit.     Final Clinical Impressions(s) / ED Diagnoses   Final diagnoses:  None    New Prescriptions New Prescriptions   No medications on file      Francine Graven, DO 05/23/17 1851

## 2017-05-20 NOTE — ED Notes (Signed)
Attempted to call report. Was advised nurse receiving pt will call this nurse back for report. 

## 2017-05-20 NOTE — ED Notes (Signed)
Pt asked about eating. Pt was told she did not pass swallow screen earlier today & had to be evaulated before she could eat. Pt denies having issues swallowing at present. Pt denies problem now only had issues 2 years ago after a stroke. Pt informed we would repeat swallow screen.

## 2017-05-20 NOTE — ED Notes (Signed)
Pt c/o lower back and sacral pain.  Pt states she sees Dr. Carloyn Manner for chronic back pain.  Also states she takes hydrocodone.

## 2017-05-20 NOTE — ED Triage Notes (Signed)
EMS found pt laying in floor,alert and  face down.  ? Down time .  CBG 114.  Pt unable to stand with EMS.   Per EMS 2  empty pill bottles laying in bedroom floor.  Also per EMS appeared to be some crushed pills on the bathroom counter. Pt lethargic upon arrival to ED. Per EMS one bottle was zolpidem and the other bottle did not have a label.  Also cigarettes all over the floors with an overturned ash tray.

## 2017-05-21 ENCOUNTER — Observation Stay (HOSPITAL_COMMUNITY)
Admit: 2017-05-21 | Discharge: 2017-05-21 | Disposition: A | Discharge status: Home / Self Care | Payer: Medicare Other | Attending: Internal Medicine | Admitting: Internal Medicine

## 2017-05-21 ENCOUNTER — Other Ambulatory Visit (HOSPITAL_COMMUNITY): Payer: Medicare Other

## 2017-05-21 DIAGNOSIS — R55 Syncope and collapse: Secondary | ICD-10-CM | POA: Diagnosis not present

## 2017-05-21 DIAGNOSIS — G894 Chronic pain syndrome: Secondary | ICD-10-CM

## 2017-05-21 DIAGNOSIS — R4182 Altered mental status, unspecified: Secondary | ICD-10-CM | POA: Diagnosis not present

## 2017-05-21 DIAGNOSIS — J438 Other emphysema: Secondary | ICD-10-CM | POA: Diagnosis not present

## 2017-05-21 LAB — MAGNESIUM
Magnesium: 2.1 mg/dL (ref 1.7–2.4)
Magnesium: 2 mg/dL (ref 1.7–2.4)

## 2017-05-21 LAB — T4, FREE: Free T4: 0.7 ng/dL (ref 0.61–1.12)

## 2017-05-21 LAB — BASIC METABOLIC PANEL
Anion gap: 10 (ref 5–15)
BUN: 10 mg/dL (ref 6–20)
CO2: 28 mmol/L (ref 22–32)
Calcium: 9.1 mg/dL (ref 8.9–10.3)
Chloride: 97 mmol/L — ABNORMAL LOW (ref 101–111)
Creatinine, Ser: 0.61 mg/dL (ref 0.44–1.00)
GFR calc Af Amer: >60 mL/min (ref >60)
GFR calc non Af Amer: >60 mL/min (ref >60)
Glucose, Bld: 110 mg/dL — ABNORMAL HIGH (ref 65–99)
Potassium: 4.1 mmol/L (ref 3.5–5.1)
Sodium: 135 mmol/L (ref 135–145)

## 2017-05-21 LAB — PHOSPHORUS: Phosphorus: 3.5 mg/dL (ref 2.5–4.6)

## 2017-05-21 LAB — VITAMIN B12: Vitamin B-12: 438 pg/mL (ref 180–914)

## 2017-05-21 LAB — FOLATE: Folate: 19.5 ng/mL (ref >5.9)

## 2017-05-21 LAB — TROPONIN I: Troponin I: <0.03 ng/mL (ref <0.03)

## 2017-05-21 LAB — TSH: TSH: 0.422 u[IU]/mL (ref 0.350–4.500)

## 2017-05-21 MED ORDER — PANTOPRAZOLE SODIUM 40 MG PO TBEC
40.0000 mg | DELAYED_RELEASE_TABLET | Freq: Every day | ORAL | Status: DC
  Administered 2017-05-21 – 2017-05-22 (×2): 40 mg via ORAL
  Filled 2017-05-21 (×2): qty 1

## 2017-05-21 MED ORDER — SODIUM CHLORIDE 0.9 % IV SOLN: INTRAVENOUS | Status: DC

## 2017-05-21 NOTE — Progress Notes (Signed)
SLP Cancellation Note  Patient Details Name: Meagan Holt MRN: [id number] DOB: Apr 01, 1941   Cancelled treatment:       Reason Eval/Treat Not Completed: Patient at procedure or test/unavailable; Pt currently with nursing for personal care. SLP will check back later for BSE.  Thank you,  Genene Churn, [place]    [place] 05/21/2017, 11:04 AM

## 2017-05-21 NOTE — Progress Notes (Signed)
PROGRESS NOTE  Meagan Holt ZDG:[id number] DOB: 07-14-1941 DOA: 05/20/2017 PCP: Meagan [place], MD  Brief History:  76 year old female with a history of COPD, chronic back pain status post fusion and laminectomy, stroke in 2014 presented after activating her lifeline wrist device. Apparently, the patient woke up on the floor with difficulty getting up. As a result, she activated her Lifeline device. The patient does not recall the events that led to her falling onto the floor. She states that she was walking from the kitchen back to the bedroom. She denied any chest pain, ferrous breath, palpitations, aura.  She denies any bowel or bladder incontinence or biting her tongue. Upon EMS arrival,  pt was lying face down on the floor, 2 empty pill bottles laying on the bedroom floor, crushed pills on the bathroom counter (one bottle was zolpidem the other was unlabeled), cigarettes on the floors with an overturned ash tray. EMS states pt was lethargic and unable to stand. CBG 114 en route. Pt herself denied any complaints s/p fall. She endorses hx of chronic back pain which is managed by Meagan Holt.  Patient denies fevers, chills, headache, visual disturbance, local extremity weakness, dysesthesias, chest pain, dyspnea, nausea, vomiting, diarrhea, abdominal pain, dysuria, hematuria, hematochezia, and melena. Upon presentation, the patient was afebrile hemodynamically stable satting 100% on room air. BMP and CBC were unremarkable. Lactic acid was reported 9. CPK 31.  Assessment/Plan: Syncope/acute encephalopathy -Likely secondary to overuse/misuse of ambien -I queried [place] controlled substance database--pt picked up [place] one month earlier than scheduled -when asked if she has been using more ambien than prescribed, patient answers "maybe" -Echo -orthostatic vitals -PT eval -serum B12 -TSH -UDS positive for opiates only -UA neg for pyuria -CT brain--neg for acute findings -CT cervical  spine--stable cervical spondylosis with degenerative joint disease. -Person reviewed EKG--sinus rhythm, nonspecific T-wave changes, first degree AVB -Personally reviewed telemetry--no concerning dysrhythmia -CPK 31  COPD/tobacco abuse -tobacco cessation discussed -Stable on room air -Continue as needed bronchodilators -Continue LABA  Chronic pain syndrome -Continue home dose of hydrocodone  Depression -Continue Wellbutrin and fluoxetine  History of stroke -Continue aspirin   Disposition Plan:   Home 9/18 if stable Family Communication:  No Family at bedside--Total time spent 35 minutes.  Greater than 50% spent face to face counseling and coordinating care.   Consultants:  none  Code Status:  FULL   DVT Prophylaxis:  [place] Heparin   Procedures: As Listed in Progress Note Above  Antibiotics: None    Subjective: Patient denies fevers, chills, headache, chest pain, dyspnea, nausea, vomiting, diarrhea, abdominal pain, dysuria, hematuria, hematochezia, and melena.   Objective: Vitals:   05/20/17 2318 05/20/17 2326 05/21/17 0519 05/21/17 0841  BP:   121/78   Pulse:   68   Resp:   18   Temp:   98.7 F (37.1 C)   TempSrc:   Oral   SpO2: 93% 95% 98% 92%  Weight:      Height:        Intake/Output Summary (Last 24 hours) at 05/21/17 1029 Last data filed at 05/21/17 0520  Gross per 24 hour  Intake                0 ml  Output              500 ml  Net             -500 ml   Weight  change:  Exam:   General:  Pt is alert, follows commands appropriately, not in acute distress  HEENT: No icterus, No thrush, No neck mass, [place]/AT  Cardiovascular: RRR, S1/S2, no rubs, no gallops  Respiratory: Diminished breath sounds. Bibasilar rales. No wheezing.  Abdomen: Soft/+BS, non tender, non distended, no guarding  Extremities: No edema, No lymphangitis, No petechiae, No rashes, no synovitis   Data Reviewed: I have personally reviewed following labs and imaging  studies Basic Metabolic Panel:  Recent Labs Lab 05/20/17 1417 05/20/17 2009 05/21/17 0213  NA 137  --   --   K 4.2  --   --   CL 100*  --   --   CO2 29  --   --   GLUCOSE 90  --   --   BUN 9  --   --   CREATININE 0.65  --   --   CALCIUM 8.9  --   --   MG  --  1.9 2.1  PHOS  --  3.7 3.5   Liver Function Tests:  Recent Labs Lab 05/20/17 1417  AST 19  ALT 15  ALKPHOS 62  BILITOT 0.5  PROT 6.5  ALBUMIN 3.3*   No results for input(s): LIPASE, AMYLASE in the last 168 hours. No results for input(s): AMMONIA in the last 168 hours. Coagulation Profile: No results for input(s): INR, PROTIME in the last 168 hours. CBC:  Recent Labs Lab 05/20/17 1417  WBC 5.8  NEUTROABS 3.4  HGB 12.9  HCT 39.4  MCV 97.8  PLT 239   Cardiac Enzymes:  Recent Labs Lab 05/20/17 1417 05/20/17 2009 05/21/17 0213  CKTOTAL 31*  --   --   TROPONINI <0.03 <0.03 <0.03   BNP: Invalid input(s): POCBNP CBG: No results for input(s): GLUCAP in the last 168 hours. HbA1C: No results for input(s): HGBA1C in the last 72 hours. Urine analysis:    Component Value Date/Time   COLORURINE STRAW (A) 05/20/2017 1617   APPEARANCEUR CLEAR 05/20/2017 1617   LABSPEC 1.010 05/20/2017 1617   PHURINE 7.0 05/20/2017 1617   GLUCOSEU NEGATIVE 05/20/2017 1617   HGBUR NEGATIVE 05/20/2017 1617   BILIRUBINUR NEGATIVE 05/20/2017 1617   KETONESUR NEGATIVE 05/20/2017 1617   PROTEINUR NEGATIVE 05/20/2017 1617   UROBILINOGEN 0.2 07/30/2013 1831   NITRITE NEGATIVE 05/20/2017 1617   LEUKOCYTESUR NEGATIVE 05/20/2017 1617   Sepsis Labs: @LABRCNTIP (procalcitonin:4,lacticidven:4) )No results found for this or any previous visit (from the past 240 hour(s)).   Scheduled Meds: . aspirin EC  325 mg Oral Daily  . FLUoxetine  10 mg Oral Daily  . gabapentin  600 mg Oral TID  . heparin  5,000 Units Subcutaneous Q8H  . Influenza vac split quadrivalent PF  0.5 mL Intramuscular Tomorrow-1000  . mometasone-formoterol  2  puff Inhalation BID  . pantoprazole  40 mg Oral Daily   Continuous Infusions: . sodium chloride 100 mL/hr at 05/20/17 2300    Procedures/Studies: Dg Chest 2 View  Result Date: 05/20/2017 CLINICAL DATA:  Chronic back pain after fall. EXAM: CHEST  2 VIEW COMPARISON:  None. FINDINGS: Stable cardiomegaly. The hila, mediastinum, lungs, and pleura otherwise demonstrate no acute abnormalities. No focal infiltrate or overt edema. IMPRESSION: No active cardiopulmonary disease. Electronically Signed   By: Dorise Bullion III M.D   On: 05/20/2017 14:05   Dg Lumbar Spine Complete  Result Date: 05/20/2017 CLINICAL DATA:  Pain after fall EXAM: LUMBAR SPINE - COMPLETE 4+ VIEW COMPARISON:  CT scan March 05, 2012 and  x-ray February 07, 2010 FINDINGS: Suture material seen in the abdomen. Scoliotic curvature of the lumbar spine, apex to the right is stable. No definitive fracture. Disc spacer devices seen at L3-4, L4-5, and L5-S1. The device at L5-S1 is stable. One of the 2 devices at L4-5 may be located 2 or 3 mm more posteriorly near the posterior endplate of L4 when compared to the previous study. One of the 2 spacers at L3-4 may also have migrated 2 or 3 mm posteriorly. Neither of these 2 screws extends into the canal. Degenerative changes. Grade 1 retrolisthesis of L4 versus L5 persists. IMPRESSION: 1. No acute fracture or traumatic malalignment identified. Persistent grade 1 retrolisthesis of L4 versus L5. 2. 2 disc spacer devices are seen at L3-4 and L4-5. One of the 2 devices at each level appears to have migrated a few mm posteriorly but neither extend into the canal. Electronically Signed   By: Dorise Bullion III M.D   On: 05/20/2017 14:11   Ct Head Wo Contrast  Result Date: 05/20/2017 CLINICAL DATA:  Patient found laying face down on floor rate in the pill bottles on floor. Lethargy. Head trauma. Altered level of consciousness. EXAM: CT HEAD WITHOUT CONTRAST CT CERVICAL SPINE WITHOUT CONTRAST TECHNIQUE:  Multidetector CT imaging of the head and cervical spine was performed following the standard protocol without intravenous contrast. Multiplanar CT image reconstructions of the cervical spine were also generated. COMPARISON:  11/07/2016 FINDINGS: CT HEAD FINDINGS Brain: The brainstem, cerebellum, cerebral peduncles, thalami, basal ganglia, basilar cisterns, and ventricular system appear within normal limits. No intracranial hemorrhage, mass lesion, or acute CVA. Vascular: There is atherosclerotic calcification of the cavernous carotid arteries bilaterally. Skull: Unremarkable Sinuses/Orbits: Unremarkable Other: No supplemental non-categorized findings. CT CERVICAL SPINE FINDINGS Alignment: 3 mm degenerative grade 1 anterolisthesis at C2- 3, no change from 11/07/2016. Straightening of the normal cervical lordosis. Skull base and vertebrae: Prominent loss of intervertebral disc height at all levels between C3 and T3. Multilevel posterior osseous ridging. Endplate sclerosis. Multilevel facet arthropathy. Pannus posterior to the odontoid. No appreciable fracture. No significant change from the 11/07/2016 exam. Soft tissues and spinal canal: No prevertebral soft tissue swelling. Disc levels: Uncinate and facet spurring cause osseous foraminal stenosis bilaterally at C3-4, C4-5, C5-6, and C6-7. Upper chest: Unremarkable Other: No supplemental non-categorized findings. IMPRESSION: 1. No acute intracranial findings or acute cervical spine findings. 2. Stable cervical spondylosis and degenerative disc disease with multilevel impingement. 3. 3 mm of degenerative grade 1 anterolisthesis at C2- 3, unchanged from March 2018. Electronically Signed   By: Van Clines M.D.   On: 05/20/2017 13:45   Ct Cervical Spine Wo Contrast  Result Date: 05/20/2017 CLINICAL DATA:  Patient found laying face down on floor rate in the pill bottles on floor. Lethargy. Head trauma. Altered level of consciousness. EXAM: CT HEAD WITHOUT  CONTRAST CT CERVICAL SPINE WITHOUT CONTRAST TECHNIQUE: Multidetector CT imaging of the head and cervical spine was performed following the standard protocol without intravenous contrast. Multiplanar CT image reconstructions of the cervical spine were also generated. COMPARISON:  11/07/2016 FINDINGS: CT HEAD FINDINGS Brain: The brainstem, cerebellum, cerebral peduncles, thalami, basal ganglia, basilar cisterns, and ventricular system appear within normal limits. No intracranial hemorrhage, mass lesion, or acute CVA. Vascular: There is atherosclerotic calcification of the cavernous carotid arteries bilaterally. Skull: Unremarkable Sinuses/Orbits: Unremarkable Other: No supplemental non-categorized findings. CT CERVICAL SPINE FINDINGS Alignment: 3 mm degenerative grade 1 anterolisthesis at C2- 3, no change from 11/07/2016. Straightening of the normal cervical  lordosis. Skull base and vertebrae: Prominent loss of intervertebral disc height at all levels between C3 and T3. Multilevel posterior osseous ridging. Endplate sclerosis. Multilevel facet arthropathy. Pannus posterior to the odontoid. No appreciable fracture. No significant change from the 11/07/2016 exam. Soft tissues and spinal canal: No prevertebral soft tissue swelling. Disc levels: Uncinate and facet spurring cause osseous foraminal stenosis bilaterally at C3-4, C4-5, C5-6, and C6-7. Upper chest: Unremarkable Other: No supplemental non-categorized findings. IMPRESSION: 1. No acute intracranial findings or acute cervical spine findings. 2. Stable cervical spondylosis and degenerative disc disease with multilevel impingement. 3. 3 mm of degenerative grade 1 anterolisthesis at C2- 3, unchanged from March 2018. Electronically Signed   By: Van Clines M.D.   On: 05/20/2017 13:45    Roylene Heaton, DO  [facility] Pager [phone]  If 7PM-7AM, please contact night-coverage [email or web address] Password [email or web address] 05/21/2017, 10:29 AM   LOS: 0 days

## 2017-05-21 NOTE — Care Management Note (Signed)
Case Management Note  Patient Details  Name: Meagan Holt MRN: [id number] Date of Birth: [birth year]  Subjective/Objective:                  Admitted with AMS. Pt from home, lives alone, has neighbors/friends who check on her and help with shopping and transportation. She has insurance with drug coverage and self administers medications by color coding because she has macular degeneration. Pt has life alert. Pt has cane and RW she uses as needed. She has PCP. She is agreeable to [facility] and has used [facility] in the past, wants to use them again.   Action/Plan: DC home with [facility] services through [facility]. Vaughan Basta, [facility] rep, aware of referral and will obtain info from chart. DC anticipated tomorrow, [facility] aware, CM will follow and verify DC with rep tomorrow.   Expected Discharge Date:      05/22/2017            Expected Discharge Plan:  [place]  In-House Referral:  NA  Discharge planning Services  CM Consult  Post Acute Care Choice:  Home Health Choice offered to:  Patient  HH Arranged:  PT, RN [facility] Agency:  [place]  Status of Service:  In process, will continue to follow  Sherald Barge, RN 05/21/2017, 3:02 PM

## 2017-05-21 NOTE — Evaluation (Addendum)
Physical Therapy Evaluation Patient Details Name: Meagan Holt MRN: [id number] DOB: 1941-03-10 Today's Date: 05/21/2017   History of Present Illness  Meagan Holt is a 76 y.o. female with medical history significant of osteoarthritis, chronic back pain, COPD, depression, frequent falls, GERD, hiatal hernia, urolithiasis, history of CVA in 2014 who was brought to the emergency department via EMS after she had Meagan Holt lifeline wrist device activated and was found at home lying face down on the floor. She mentions that Meagan Holt allergy device may had been activated when she fell on the floor, since she does not remember activating it. EMS described that they found 2 went the bottles on the floor (one was unlabeled and was for zolpidem with crush pills on the bathroom counter. They also mentioned finding cigarettes, cigarette butts and ash on the floor, as well as an overturned ashtray.     Clinical Impression  Patient limited for functional mobility as stated below secondary to BLE weakness, fatigue and decreased endurance for functional activity and gait.  Patient will benefit from continued physical therapy in hospital and recommended venue below to increase strength, balance, endurance for safe ADLs and gait.    Follow Up Recommendations Home health PT;Supervision - Intermittent    Equipment Recommendations  None recommended by PT    Recommendations for Other Services       Precautions / Restrictions Precautions Precautions: Fall Restrictions Weight Bearing Restrictions: No      Mobility  Bed Mobility Overal bed mobility: Needs Assistance Bed Mobility: Supine to Sit;Sit to Supine     Supine to sit: Supervision Sit to supine: Supervision      Transfers Overall transfer level: Needs assistance Equipment used: Rolling walker (2 wheeled) Transfers: Sit to/from Stand Sit to Stand: Supervision            Ambulation/Gait Ambulation/Gait assistance: Supervision   Ambulation  Distance (feet): 35    Assistive device: Rolling walker (2 wheeled) Gait Pattern/deviations: Decreased step length - right;Decreased step length - left;Decreased stride length   Gait velocity interpretation: Below normal speed for age/gender General Gait Details: Patient demonstrates slightly labored cadence without loss of balance, limited secondary to c/o fatigue  Stairs            Wheelchair Mobility    Modified Rankin (Stroke Patients Only)       Balance Overall balance assessment: Needs assistance Sitting-balance support: No upper extremity supported;Feet supported Sitting balance-Leahy Scale: Good     Standing balance support: Bilateral upper extremity supported;During functional activity Standing balance-Leahy Scale: Fair                               Pertinent Vitals/Pain Pain Assessment: No/denies pain    Home Living Family/patient expects to be discharged to:: Private residence Living Arrangements: Alone Available Help at Discharge: Family;Friend(s);Available PRN/intermittently Type of Home: Apartment Home Access: Stairs to enter Entrance Stairs-Rails: Can reach both;Right;Left Entrance Stairs-Number of Steps: 16 Home Layout: Two level Home Equipment: Cane - single point;Walker - 2 wheels Additional Comments: does not drive, Meagan Holt friends take Meagan Holt shopping    Prior Function Level of Independence: Independent with assistive device(s)         Comments: Meagan Holt neighbors can help     Hand Dominance        Extremity/Trunk Assessment   Upper Extremity Assessment Upper Extremity Assessment: Overall WFL for tasks assessed    Lower Extremity Assessment Lower Extremity Assessment: Overall  WFL for tasks assessed    Cervical / Trunk Assessment Cervical / Trunk Assessment: Normal  Communication   Communication: No difficulties  Cognition Arousal/Alertness: Awake/alert Behavior During Therapy: WFL for tasks assessed/performed Overall  Cognitive Status: Within Functional Limits for tasks assessed                                        General Comments      Exercises     Assessment/Plan    PT Assessment    PT Problem List Decreased strength;Decreased activity tolerance;Decreased balance;Decreased mobility       PT Treatment Interventions Gait training;Stair training;Functional mobility training;Therapeutic activities;Therapeutic exercise;Patient/family education    PT Goals (Current goals can be found in the Care Plan section)  Acute Rehab PT Goals Patient Stated Goal: return home with neighbors and friends to help PT Goal Formulation: With patient Time For Goal Achievement: 05/25/17 Potential to Achieve Goals: Good    Frequency Min 3X/week   Barriers to discharge        Co-evaluation               AM-PAC PT "6 Clicks" Daily Activity  Outcome Measure Difficulty turning over in bed (including adjusting bedclothes, sheets and blankets)?: None Difficulty moving from lying on back to sitting on the side of the bed? : None Difficulty sitting down on and standing up from a chair with arms (e.g., wheelchair, bedside commode, etc,.)?: None Help needed moving to and from a bed to chair (including a wheelchair)?: A Little Help needed walking in hospital room?: A Little Help needed climbing 3-5 steps with a railing? : A Little 6 Click Score: 21    End of Session   Activity Tolerance: Patient tolerated treatment well;Patient limited by fatigue Patient left: in bed;with call bell/phone within reach Nurse Communication: Mobility status PT Visit Diagnosis: Unsteadiness on feet (R26.81);Other abnormalities of gait and mobility (R26.89);Muscle weakness (generalized) (M62.81)    Time: 9532-0233 PT Time Calculation (min) (ACUTE ONLY): 29 min   Charges:   PT Evaluation $PT Eval Low Complexity: 1 Low PT Treatments $Therapeutic Activity: 23-37 mins   PT G Codes:   PT G-Codes **NOT FOR  INPATIENT CLASS** Functional Assessment Tool Used: AM-PAC 6 Clicks Basic Mobility Functional Limitation: Mobility: Walking and moving around Mobility: Walking and Moving Around Current Status ([id number]): At least 20 percent but less than 40 percent impaired, limited or restricted Mobility: Walking and Moving Around Goal Status [phone]): At least 20 percent but less than 40 percent impaired, limited or restricted Mobility: Walking and Moving Around Discharge Status [phone]): At least 20 percent but less than 40 percent impaired, limited or restricted    1:23 PM, 05/21/17 Lonell Grandchild, MPT Physical Therapist with [facility] [phone] office [phone] mobile phone

## 2017-05-21 NOTE — Evaluation (Signed)
Clinical/Bedside Swallow Evaluation Patient Details  Name: MARIJAYNE RAUTH MRN: [id number] Date of Birth: Oct 05, 1940  Today's Date: 05/21/2017 Time: SLP Start Time (ACUTE ONLY): 1200 SLP Stop Time (ACUTE ONLY): 1224 SLP Time Calculation (min) (ACUTE ONLY): 24 min  Past Medical History:  Past Medical History:  Diagnosis Date  . Arthritis    "all my joints" (07/30/2013)  . Chronic back pain   . Chronic bronchitis ([place])    "get it q year" (07/30/2013)  . COPD (chronic obstructive pulmonary disease) ([place])   . Depression   . Falls frequently    "fell twice in the last 2 days" (07/30/2013)  . GERD (gastroesophageal reflux disease)    "rarely" (07/30/2013)  . H/O hiatal hernia   . History of blood transfusion   . Kidney stones   . Stroke [facility])    Past Surgical History:  Past Surgical History:  Procedure Laterality Date  . APPENDECTOMY    . BACK SURGERY     "put cages in mid back; did arthroscopic OR on lower back" (11/326/2014)  . BILATERAL OOPHORECTOMY Bilateral   . CATARACT EXTRACTION W/ INTRAOCULAR LENS  IMPLANT, BILATERAL Bilateral   . CHOLECYSTECTOMY    . EXCISIONAL HEMORRHOIDECTOMY    . GASTRIC BYPASS    . HERNIA REPAIR     "removed my belly button too" (07/30/2013)  . KIDNEY STONE SURGERY     "twice" (07/30/2013)  . KNEE ARTHROSCOPY Left   . TONSILLECTOMY     HPI:  76 year old female with a history of COPD, chronic back pain status post fusion and laminectomy, stroke in 2014 presented after activating her lifeline wrist device. Apparently, the patient woke up on the floor with difficulty getting up. As a result, she activated her Lifeline device. The patient does not recall the events that led to her falling onto the floor. She states that she was walking from the kitchen back to the bedroom. She denied any chest pain, ferrous breath, palpitations, aura.  She denies any bowel or bladder incontinence or biting her tongue. Upon EMS arrival, pt was lying face down on the  floor, 2 empty pill bottles laying on the bedroom floor, crushed pills on the bathroom counter (one bottle was zolpidem the other was unlabeled), cigarettes on the floors with an overturned ash tray. EMS states pt was lethargic and unable to stand. CBG 114 en route. Pt herself denied any complaints s/p fall.   Assessment / Plan / Recommendation Clinical Impression  Clinical swallow evaluation completed at bedside. Pt reports previous h/o needing thickened liquids following a stroke a couple of year ago, however reports no current difficulties with swallowing. Oral motor examination unremarkable. Pt without overt s/sx aspiration with consistencies and textures presented. Pt endorses some difficulty self feeding with fork due to visual deficits and preferred use of spoon at lunch. Recommend regular textures and thin liquids. SLP will sign off. Risk for aspiration appears low at this time.  SLP Visit Diagnosis: Dysphagia, oropharyngeal phase (R13.12)    Aspiration Risk  Mild aspiration risk    Diet Recommendation Regular;Thin liquid   Liquid Administration via: Cup;Straw Medication Administration: Whole meds with liquid Supervision: Patient able to self feed (set up assist) Postural Changes: Seated upright at 90 degrees;Remain upright for at least 30 minutes after po intake    Other  Recommendations Oral Care Recommendations: Oral care BID Other Recommendations: Clarify dietary restrictions   Follow up Recommendations None      Frequency and Duration  Prognosis Prognosis for Safe Diet Advancement: Good      Swallow Study   General Date of Onset: 05/20/17 HPI: 76 year old female with a history of COPD, chronic back pain status post fusion and laminectomy, stroke in 2014 presented after activating her lifeline wrist device. Apparently, the patient woke up on the floor with difficulty getting up. As a result, she activated her Lifeline device. The patient does not recall the  events that led to her falling onto the floor. She states that she was walking from the kitchen back to the bedroom. She denied any chest pain, ferrous breath, palpitations, aura.  She denies any bowel or bladder incontinence or biting her tongue. Upon EMS arrival, pt was lying face down on the floor, 2 empty pill bottles laying on the bedroom floor, crushed pills on the bathroom counter (one bottle was zolpidem the other was unlabeled), cigarettes on the floors with an overturned ash tray. EMS states pt was lethargic and unable to stand. CBG 114 en route. Pt herself denied any complaints s/p fall. Type of Study: Bedside Swallow Evaluation Previous Swallow Assessment: none on record, but Pt reportedly h/o thickened liquids in the past after CVA Diet Prior to this Study: Regular;Thin liquids Temperature Spikes Noted: No Respiratory Status: Room air History of Recent Intubation: No Behavior/Cognition: Alert;Cooperative;Pleasant mood Oral Cavity Assessment: Within Functional Limits Oral Care Completed by SLP: No Oral Cavity - Dentition: Dentures, top;Dentures, bottom Vision: Impaired for self-feeding (Pt reported better success with spoon due to vision deficits) Self-Feeding Abilities: Able to feed self;Needs set up Patient Positioning: Upright in bed Baseline Vocal Quality: Normal Volitional Cough: Strong Volitional Swallow: Able to elicit    Oral/Motor/Sensory Function Overall Oral Motor/Sensory Function: Within functional limits   Ice Chips Ice chips: Within functional limits   Thin Liquid Thin Liquid: Within functional limits Presentation: Cup;Straw;Self Fed    Nectar Thick Nectar Thick Liquid: Not tested   Honey Thick Honey Thick Liquid: Not tested   Puree Puree: Within functional limits Presentation: Spoon   Solid   GO   Solid: Within functional limits Presentation: Self Fed    Functional Assessment Tool Used: clinical judgment Functional Limitations: Swallowing Swallow  Current Status (L9379): 0 percent impaired, limited or restricted Swallow Goal Status (K2409): 0 percent impaired, limited or restricted Swallow Discharge Status [id number]): 0 percent impaired, limited or restricted  Thank you,  Genene Churn, [place]  Kyden Potash 05/21/2017,12:31 PM

## 2017-05-21 NOTE — Care Management Obs Status (Signed)
[place] NOTIFICATION   Patient Details  Name: Meagan Holt MRN: [id number] Date of Birth: May 28, 1941   Medicare Observation Status Notification Given:  Yes    Sherald Barge, RN 05/21/2017, 10:11 AM

## 2017-05-21 NOTE — Progress Notes (Signed)
EEG Completed; Results Pending  

## 2017-05-21 NOTE — Discharge Summary (Addendum)
Physician Discharge Summary  Meagan Holt:[id number] DOB: [birth year] DOA: 05/20/2017  PCP: [name], MD  Admit date: 05/20/2017 Discharge date: 05/22/2017  Admitted From: Home Disposition:  Home   Recommendations for Outpatient Follow-up:  1. Follow up with PCP in 1-2 weeks 2. Please obtain BMP/CBC in one week   Home Health:YES Equipment/Devices: HHPT  Discharge Condition: Stable CODE STATUS: FULL Diet recommendation: Heart Healthy    Brief/Interim Summary: 76 year old female with a history of COPD, chronic back pain status post fusion and laminectomy, stroke in 2014 presented after activating her lifeline wrist device. Apparently, the patient woke up on the floor with difficulty getting up. As a result, she activated her Lifeline device. The patient does not recall the events that led to her falling onto the floor. She states that she was walking from the kitchen back to the bedroom. She denied any chest pain, ferrous breath, palpitations, aura.  She denies any bowel or bladder incontinence or biting her tongue. Upon EMS arrival, pt was lying face down on the floor, 2 empty pill bottles laying on the bedroom floor, crushed pills on the bathroom counter (one bottle was zolpidem the other was unlabeled), cigarettes on the floors with an overturned ash tray. EMS states pt was lethargic and unable to stand. CBG 114 en route. Pt herself denied any complaints s/p fall. She endorses hx of chronic back pain which is managed by Dr. Carloyn Manner.  Patient denies fevers, chills, headache, visual disturbance, local extremity weakness, dysesthesias, chest pain, dyspnea, nausea, vomiting, diarrhea, abdominal pain, dysuria, hematuria, hematochezia, and melena. Upon presentation, the patient was afebrile hemodynamically stable satting 100% on room air. BMP and CBC were unremarkable. Lactic acid was reported 9. CPK 31.  Discharge Diagnoses:  Syncope/acute encephalopathy/orthostatic  hypotension -Likely secondary to overuse/misuse of ambien -I queried [place] controlled substance database--pt picked up [place] one month earlier than scheduled in late August -when asked if she has been using more ambien than prescribed, patient answers "maybe" -Echo--EF 55%, grade 1 DD, no WMA, mild MR, trivial TR -orthostatic vitals-positive as HR increased from 72 to 104 from lying to stand -PT eval-->HHPT -serum B12--438 -TSH--0.422 -UDS positive for opiates only -UA neg for pyuria -CT brain--neg for acute findings -CT cervical spine--stable cervical spondylosis with degenerative joint disease. -Person reviewed EKG--sinus rhythm, nonspecific T-wave changes, first degree AVB -Personally reviewed telemetry--no concerning dysrhythmia -CPK 31 -EEG--mild generalized slowing -repeat orthostatics improved--lying 141/77 HR 67; sit 142/81 HR 71; stand 126/78 HR 72  COPD/tobacco abuse -tobacco cessation discussed -Stable on room air -Continue as needed bronchodilators -Continue LABA  Chronic pain syndrome -Continue home dose of hydrocodone -d/c ambien  Depression -Continue Wellbutrin and fluoxetine  History of stroke -Continue aspirin    Discharge Instructions   Allergies as of 05/22/2017      Reactions   Latex Itching, Swelling   Doxycycline Rash      Medication List    STOP taking these medications   zolpidem 10 MG tablet Commonly known as:  AMBIEN     TAKE these medications   ADVAIR DISKUS 250-50 MCG/DOSE Aepb Generic drug:  Fluticasone-Salmeterol Inhale 1 puff into the lungs 2 (two) times daily.   albuterol 108 (90 Base) MCG/ACT inhaler Commonly known as:  PROVENTIL HFA;VENTOLIN HFA Inhale 1-2 puffs into the lungs every 6 (six) hours as needed for wheezing or shortness of breath.   aspirin EC 81 MG tablet Take 1 tablet (81 mg total) by mouth daily. What changed:  how much to  take   buPROPion 300 MG 24 hr tablet Commonly known as:  WELLBUTRIN XL Take  300 mg by mouth daily.   cyclobenzaprine 10 MG tablet Commonly known as:  FLEXERIL Take 1 tablet by mouth 3 (three) times daily.   Fish Oil 1000 MG Caps Take 3 capsules by mouth daily.   FLUoxetine 10 MG capsule Commonly known as:  PROZAC Take 1 capsule by mouth daily.   furosemide 20 MG tablet Commonly known as:  LASIX Take 20 mg by mouth daily as needed for fluid or edema.   gabapentin 300 MG capsule Commonly known as:  NEURONTIN Take 600 mg by mouth 3 (three) times daily.   HYDROcodone-acetaminophen 10-325 MG tablet Commonly known as:  NORCO Take 1 tablet by mouth every 6 (six) hours as needed for moderate pain.   ICAPS PO Take 1 tablet by mouth daily.   multivitamin with minerals Tabs tablet Take 1 tablet by mouth daily.       Allergies  Allergen Reactions  . Latex Itching and Swelling  . Doxycycline Rash    Consultations:  none   Procedures/Studies: Dg Chest 2 View  Result Date: 05/20/2017 CLINICAL DATA:  Chronic back pain after fall. EXAM: CHEST  2 VIEW COMPARISON:  None. FINDINGS: Stable cardiomegaly. The hila, mediastinum, lungs, and pleura otherwise demonstrate no acute abnormalities. No focal infiltrate or overt edema. IMPRESSION: No active cardiopulmonary disease. Electronically Signed   By: Dorise Bullion III M.D   On: 05/20/2017 14:05   Dg Lumbar Spine Complete  Result Date: 05/20/2017 CLINICAL DATA:  Pain after fall EXAM: LUMBAR SPINE - COMPLETE 4+ VIEW COMPARISON:  CT scan March 05, 2012 and x-ray February 07, 2010 FINDINGS: Suture material seen in the abdomen. Scoliotic curvature of the lumbar spine, apex to the right is stable. No definitive fracture. Disc spacer devices seen at L3-4, L4-5, and L5-S1. The device at L5-S1 is stable. One of the 2 devices at L4-5 may be located 2 or 3 mm more posteriorly near the posterior endplate of L4 when compared to the previous study. One of the 2 spacers at L3-4 may also have migrated 2 or 3 mm posteriorly. Neither  of these 2 screws extends into the canal. Degenerative changes. Grade 1 retrolisthesis of L4 versus L5 persists. IMPRESSION: 1. No acute fracture or traumatic malalignment identified. Persistent grade 1 retrolisthesis of L4 versus L5. 2. 2 disc spacer devices are seen at L3-4 and L4-5. One of the 2 devices at each level appears to have migrated a few mm posteriorly but neither extend into the canal. Electronically Signed   By: Dorise Bullion III M.D   On: 05/20/2017 14:11   Ct Head Wo Contrast  Result Date: 05/20/2017 CLINICAL DATA:  Patient found laying face down on floor rate in the pill bottles on floor. Lethargy. Head trauma. Altered level of consciousness. EXAM: CT HEAD WITHOUT CONTRAST CT CERVICAL SPINE WITHOUT CONTRAST TECHNIQUE: Multidetector CT imaging of the head and cervical spine was performed following the standard protocol without intravenous contrast. Multiplanar CT image reconstructions of the cervical spine were also generated. COMPARISON:  11/07/2016 FINDINGS: CT HEAD FINDINGS Brain: The brainstem, cerebellum, cerebral peduncles, thalami, basal ganglia, basilar cisterns, and ventricular system appear within normal limits. No intracranial hemorrhage, mass lesion, or acute CVA. Vascular: There is atherosclerotic calcification of the cavernous carotid arteries bilaterally. Skull: Unremarkable Sinuses/Orbits: Unremarkable Other: No supplemental non-categorized findings. CT CERVICAL SPINE FINDINGS Alignment: 3 mm degenerative grade 1 anterolisthesis at C2- 3, no change  from 11/07/2016. Straightening of the normal cervical lordosis. Skull base and vertebrae: Prominent loss of intervertebral disc height at all levels between C3 and T3. Multilevel posterior osseous ridging. Endplate sclerosis. Multilevel facet arthropathy. Pannus posterior to the odontoid. No appreciable fracture. No significant change from the 11/07/2016 exam. Soft tissues and spinal canal: No prevertebral soft tissue swelling. Disc  levels: Uncinate and facet spurring cause osseous foraminal stenosis bilaterally at C3-4, C4-5, C5-6, and C6-7. Upper chest: Unremarkable Other: No supplemental non-categorized findings. IMPRESSION: 1. No acute intracranial findings or acute cervical spine findings. 2. Stable cervical spondylosis and degenerative disc disease with multilevel impingement. 3. 3 mm of degenerative grade 1 anterolisthesis at C2- 3, unchanged from March 2018. Electronically Signed   By: Van Clines M.D.   On: 05/20/2017 13:45   Ct Cervical Spine Wo Contrast  Result Date: 05/20/2017 CLINICAL DATA:  Patient found laying face down on floor rate in the pill bottles on floor. Lethargy. Head trauma. Altered level of consciousness. EXAM: CT HEAD WITHOUT CONTRAST CT CERVICAL SPINE WITHOUT CONTRAST TECHNIQUE: Multidetector CT imaging of the head and cervical spine was performed following the standard protocol without intravenous contrast. Multiplanar CT image reconstructions of the cervical spine were also generated. COMPARISON:  11/07/2016 FINDINGS: CT HEAD FINDINGS Brain: The brainstem, cerebellum, cerebral peduncles, thalami, basal ganglia, basilar cisterns, and ventricular system appear within normal limits. No intracranial hemorrhage, mass lesion, or acute CVA. Vascular: There is atherosclerotic calcification of the cavernous carotid arteries bilaterally. Skull: Unremarkable Sinuses/Orbits: Unremarkable Other: No supplemental non-categorized findings. CT CERVICAL SPINE FINDINGS Alignment: 3 mm degenerative grade 1 anterolisthesis at C2- 3, no change from 11/07/2016. Straightening of the normal cervical lordosis. Skull base and vertebrae: Prominent loss of intervertebral disc height at all levels between C3 and T3. Multilevel posterior osseous ridging. Endplate sclerosis. Multilevel facet arthropathy. Pannus posterior to the odontoid. No appreciable fracture. No significant change from the 11/07/2016 exam. Soft tissues and spinal  canal: No prevertebral soft tissue swelling. Disc levels: Uncinate and facet spurring cause osseous foraminal stenosis bilaterally at C3-4, C4-5, C5-6, and C6-7. Upper chest: Unremarkable Other: No supplemental non-categorized findings. IMPRESSION: 1. No acute intracranial findings or acute cervical spine findings. 2. Stable cervical spondylosis and degenerative disc disease with multilevel impingement. 3. 3 mm of degenerative grade 1 anterolisthesis at C2- 3, unchanged from March 2018. Electronically Signed   By: Van Clines M.D.   On: 05/20/2017 13:45        Discharge Exam: Vitals:   05/22/17 0609 05/22/17 0916  BP: (!) 158/78   Pulse: 71   Resp: 18   Temp: 98.2 F (36.8 C)   SpO2: 97% 95%   Vitals:   05/21/17 2003 05/21/17 2100 05/22/17 0609 05/22/17 0916  BP:  (!) 157/74 (!) 158/78   Pulse: (!) 56 67 71   Resp: 16 18 18    Temp:  98 F (36.7 C) 98.2 F (36.8 C)   TempSrc:  Oral Oral   SpO2: 92% 98% 97% 95%  Weight:      Height:        General: Pt is alert, awake, not in acute distress Cardiovascular: RRR, S1/S2 +, no rubs, no gallops Respiratory: CTA bilaterally, no wheezing, no rhonchi Abdominal: Soft, NT, ND, bowel sounds + Extremities: no edema, no cyanosis   The results of significant diagnostics from this hospitalization (including imaging, microbiology, ancillary and laboratory) are listed below for reference.    Significant Diagnostic Studies: Dg Chest 2 View  Result Date: 05/20/2017  CLINICAL DATA:  Chronic back pain after fall. EXAM: CHEST  2 VIEW COMPARISON:  None. FINDINGS: Stable cardiomegaly. The hila, mediastinum, lungs, and pleura otherwise demonstrate no acute abnormalities. No focal infiltrate or overt edema. IMPRESSION: No active cardiopulmonary disease. Electronically Signed   By: Dorise Bullion III M.D   On: 05/20/2017 14:05   Dg Lumbar Spine Complete  Result Date: 05/20/2017 CLINICAL DATA:  Pain after fall EXAM: LUMBAR SPINE - COMPLETE 4+  VIEW COMPARISON:  CT scan March 05, 2012 and x-ray February 07, 2010 FINDINGS: Suture material seen in the abdomen. Scoliotic curvature of the lumbar spine, apex to the right is stable. No definitive fracture. Disc spacer devices seen at L3-4, L4-5, and L5-S1. The device at L5-S1 is stable. One of the 2 devices at L4-5 may be located 2 or 3 mm more posteriorly near the posterior endplate of L4 when compared to the previous study. One of the 2 spacers at L3-4 may also have migrated 2 or 3 mm posteriorly. Neither of these 2 screws extends into the canal. Degenerative changes. Grade 1 retrolisthesis of L4 versus L5 persists. IMPRESSION: 1. No acute fracture or traumatic malalignment identified. Persistent grade 1 retrolisthesis of L4 versus L5. 2. 2 disc spacer devices are seen at L3-4 and L4-5. One of the 2 devices at each level appears to have migrated a few mm posteriorly but neither extend into the canal. Electronically Signed   By: Dorise Bullion III M.D   On: 05/20/2017 14:11   Ct Head Wo Contrast  Result Date: 05/20/2017 CLINICAL DATA:  Patient found laying face down on floor rate in the pill bottles on floor. Lethargy. Head trauma. Altered level of consciousness. EXAM: CT HEAD WITHOUT CONTRAST CT CERVICAL SPINE WITHOUT CONTRAST TECHNIQUE: Multidetector CT imaging of the head and cervical spine was performed following the standard protocol without intravenous contrast. Multiplanar CT image reconstructions of the cervical spine were also generated. COMPARISON:  11/07/2016 FINDINGS: CT HEAD FINDINGS Brain: The brainstem, cerebellum, cerebral peduncles, thalami, basal ganglia, basilar cisterns, and ventricular system appear within normal limits. No intracranial hemorrhage, mass lesion, or acute CVA. Vascular: There is atherosclerotic calcification of the cavernous carotid arteries bilaterally. Skull: Unremarkable Sinuses/Orbits: Unremarkable Other: No supplemental non-categorized findings. CT CERVICAL SPINE FINDINGS  Alignment: 3 mm degenerative grade 1 anterolisthesis at C2- 3, no change from 11/07/2016. Straightening of the normal cervical lordosis. Skull base and vertebrae: Prominent loss of intervertebral disc height at all levels between C3 and T3. Multilevel posterior osseous ridging. Endplate sclerosis. Multilevel facet arthropathy. Pannus posterior to the odontoid. No appreciable fracture. No significant change from the 11/07/2016 exam. Soft tissues and spinal canal: No prevertebral soft tissue swelling. Disc levels: Uncinate and facet spurring cause osseous foraminal stenosis bilaterally at C3-4, C4-5, C5-6, and C6-7. Upper chest: Unremarkable Other: No supplemental non-categorized findings. IMPRESSION: 1. No acute intracranial findings or acute cervical spine findings. 2. Stable cervical spondylosis and degenerative disc disease with multilevel impingement. 3. 3 mm of degenerative grade 1 anterolisthesis at C2- 3, unchanged from March 2018. Electronically Signed   By: Van Clines M.D.   On: 05/20/2017 13:45   Ct Cervical Spine Wo Contrast  Result Date: 05/20/2017 CLINICAL DATA:  Patient found laying face down on floor rate in the pill bottles on floor. Lethargy. Head trauma. Altered level of consciousness. EXAM: CT HEAD WITHOUT CONTRAST CT CERVICAL SPINE WITHOUT CONTRAST TECHNIQUE: Multidetector CT imaging of the head and cervical spine was performed following the standard protocol without intravenous contrast.  Multiplanar CT image reconstructions of the cervical spine were also generated. COMPARISON:  11/07/2016 FINDINGS: CT HEAD FINDINGS Brain: The brainstem, cerebellum, cerebral peduncles, thalami, basal ganglia, basilar cisterns, and ventricular system appear within normal limits. No intracranial hemorrhage, mass lesion, or acute CVA. Vascular: There is atherosclerotic calcification of the cavernous carotid arteries bilaterally. Skull: Unremarkable Sinuses/Orbits: Unremarkable Other: No supplemental  non-categorized findings. CT CERVICAL SPINE FINDINGS Alignment: 3 mm degenerative grade 1 anterolisthesis at C2- 3, no change from 11/07/2016. Straightening of the normal cervical lordosis. Skull base and vertebrae: Prominent loss of intervertebral disc height at all levels between C3 and T3. Multilevel posterior osseous ridging. Endplate sclerosis. Multilevel facet arthropathy. Pannus posterior to the odontoid. No appreciable fracture. No significant change from the 11/07/2016 exam. Soft tissues and spinal canal: No prevertebral soft tissue swelling. Disc levels: Uncinate and facet spurring cause osseous foraminal stenosis bilaterally at C3-4, C4-5, C5-6, and C6-7. Upper chest: Unremarkable Other: No supplemental non-categorized findings. IMPRESSION: 1. No acute intracranial findings or acute cervical spine findings. 2. Stable cervical spondylosis and degenerative disc disease with multilevel impingement. 3. 3 mm of degenerative grade 1 anterolisthesis at C2- 3, unchanged from March 2018. Electronically Signed   By: Van Clines M.D.   On: 05/20/2017 13:45     Microbiology: Recent Results (from the past 240 hour(s))  Urine culture     Status: None   Collection Time: 05/20/17  4:17 PM  Result Value Ref Range Status   Specimen Description URINE, CATHETERIZED  Final   Special Requests NONE  Final   Culture   Final    NO GROWTH Performed at [facility], [address] [place], [place] [address]    Report Status 05/22/2017 FINAL  Final     Labs: Basic Metabolic Panel:  Recent Labs Lab 05/20/17 1417 05/20/17 2009 05/21/17 0213 05/21/17 1043 05/22/17 0602  NA 137  --   --  135 136  K 4.2  --   --  4.1 3.6  CL 100*  --   --  97* 101  CO2 29  --   --  28 29  GLUCOSE 90  --   --  110* 103*  BUN 9  --   --  10 11  CREATININE 0.65  --   --  0.61 0.67  CALCIUM 8.9  --   --  9.1 8.7*  MG  --  1.9 2.1 2.0 1.8  PHOS  --  3.7 3.5  --  3.1   Liver Function Tests:  Recent  Labs Lab 05/20/17 1417  AST 19  ALT 15  ALKPHOS 62  BILITOT 0.5  PROT 6.5  ALBUMIN 3.3*   No results for input(s): LIPASE, AMYLASE in the last 168 hours. No results for input(s): AMMONIA in the last 168 hours. CBC:  Recent Labs Lab 05/20/17 1417  WBC 5.8  NEUTROABS 3.4  HGB 12.9  HCT 39.4  MCV 97.8  PLT 239   Cardiac Enzymes:  Recent Labs Lab 05/20/17 1417 05/20/17 2009 05/21/17 0213  CKTOTAL 31*  --   --   TROPONINI <0.03 <0.03 <0.03   BNP: Invalid input(s): POCBNP CBG: No results for input(s): GLUCAP in the last 168 hours.  Time coordinating discharge:  Greater than 30 minutes  Signed:  Xaviera Flaten, DO [facility] Pager: [phone] 05/22/2017, 11:57 AM

## 2017-05-22 ENCOUNTER — Observation Stay (HOSPITAL_BASED_OUTPATIENT_CLINIC_OR_DEPARTMENT_OTHER): Payer: Medicare Other

## 2017-05-22 DIAGNOSIS — I340 Nonrheumatic mitral (valve) insufficiency: Secondary | ICD-10-CM

## 2017-05-22 DIAGNOSIS — I951 Orthostatic hypotension: Secondary | ICD-10-CM

## 2017-05-22 DIAGNOSIS — R55 Syncope and collapse: Secondary | ICD-10-CM | POA: Diagnosis not present

## 2017-05-22 DIAGNOSIS — Z8673 Personal history of transient ischemic attack (TIA), and cerebral infarction without residual deficits: Secondary | ICD-10-CM

## 2017-05-22 DIAGNOSIS — G894 Chronic pain syndrome: Secondary | ICD-10-CM | POA: Diagnosis not present

## 2017-05-22 DIAGNOSIS — R4182 Altered mental status, unspecified: Secondary | ICD-10-CM | POA: Diagnosis not present

## 2017-05-22 DIAGNOSIS — J438 Other emphysema: Secondary | ICD-10-CM | POA: Diagnosis not present

## 2017-05-22 LAB — BASIC METABOLIC PANEL
Anion gap: 6 (ref 5–15)
BUN: 11 mg/dL (ref 6–20)
CO2: 29 mmol/L (ref 22–32)
Calcium: 8.7 mg/dL — ABNORMAL LOW (ref 8.9–10.3)
Chloride: 101 mmol/L (ref 101–111)
Creatinine, Ser: 0.67 mg/dL (ref 0.44–1.00)
GFR calc Af Amer: >60 mL/min (ref >60)
GFR calc non Af Amer: >60 mL/min (ref >60)
Glucose, Bld: 103 mg/dL — ABNORMAL HIGH (ref 65–99)
Potassium: 3.6 mmol/L (ref 3.5–5.1)
Sodium: 136 mmol/L (ref 135–145)

## 2017-05-22 LAB — URINE CULTURE: Culture: NO GROWTH

## 2017-05-22 LAB — ECHOCARDIOGRAM COMPLETE
Height: 68 in
Weight: 3120 oz

## 2017-05-22 LAB — PHOSPHORUS: Phosphorus: 3.1 mg/dL (ref 2.5–4.6)

## 2017-05-22 LAB — MAGNESIUM: Magnesium: 1.8 mg/dL (ref 1.7–2.4)

## 2017-05-22 NOTE — Progress Notes (Signed)
Pt discharged in stable condition into the care of a friend via wheelchair via private vehicle.  PIV removed intact w/o S&S of complications.  Discharge instructions reviewed with pt.  Pt verbalized understanding.

## 2017-05-22 NOTE — Care Management Note (Signed)
Case Management Note  Patient Details  Name: Meagan Holt MRN: [id number] Date of Birth: Nov 07, 1940   Expected Discharge Date:  05/22/17               Expected Discharge Plan:  [place]  In-House Referral:  NA  Discharge planning Services  CM Consult  Post Acute Care Choice:  Home Health Choice offered to:  Patient  HH Arranged:  PT, RN [facility] Agency:  [place]  Status of Service:  Completed, signed off  Additional Comments: Pt discharging home today with [facility] services. Pt aware HH has 48 hrs to make first visit. Vaughan Basta, [facility] rep, aware of DC home today.   Sherald Barge, RN 05/22/2017, 3:45 PM

## 2017-05-22 NOTE — Progress Notes (Signed)
Physical Therapy Treatment Patient Details Name: Meagan Holt MRN: [id number] DOB: 07/18/1941 Today's Date: 05/22/2017    History of Present Illness Meagan Holt is a 76 y.o. female with medical history significant of osteoarthritis, chronic back pain, COPD, depression, frequent falls, GERD, hiatal hernia, urolithiasis, history of CVA in 2014 who was brought to the emergency department via EMS after she had her lifeline wrist device activated and was found at home lying face down on the floor. She mentions that her allergy device may had been activated when she fell on the floor, since she does not remember activating it. EMS described that they found 2 went the bottles on the floor (one was unlabeled and was for zolpidem with crush pills on the bathroom counter. They also mentioned finding cigarettes, cigarette butts and ash on the floor, as well as an overturned ashtray.     PT Comments    Patient demonstrates increased tolerance for ambulation in hallway without loss of balance, no c/o pain other than posterior neck discomfort upon sitting up at bedside.  Patient will benefit from continued physical therapy in hospital and recommended venue below to increase strength, balance, endurance for safe ADLs and gait.   Follow Up Recommendations  Home health PT;Supervision - Intermittent      Equipment Recommendations  None recommended by PT    Recommendations for Other Services       Precautions / Restrictions Precautions Precautions: Fall Restrictions Weight Bearing Restrictions: No    Mobility  Bed Mobility Overal bed mobility: Needs Assistance Bed Mobility: Supine to Sit;Sit to Supine     Supine to sit: Supervision Sit to supine: Supervision      Transfers Overall transfer level: Needs assistance Equipment used: Rolling walker (2 wheeled) Transfers: Sit to/from Stand Sit to Stand: Supervision            Ambulation/Gait Ambulation/Gait assistance:  Supervision Ambulation Distance (Feet): 120 Feet Assistive device: Rolling walker (2 wheeled) Gait Pattern/deviations: Decreased stride length   Gait velocity interpretation: Below normal speed for age/gender General Gait Details: Patient demonstrates increased tolerance for gait with faster cadence, no loss of balance   Stairs            Wheelchair Mobility    Modified Rankin (Stroke Patients Only)       Balance Overall balance assessment: Needs assistance Sitting-balance support: No upper extremity supported;Feet supported Sitting balance-Leahy Scale: Good     Standing balance support: Bilateral upper extremity supported;During functional activity Standing balance-Leahy Scale: Fair                              Cognition Arousal/Alertness: Awake/alert Behavior During Therapy: WFL for tasks assessed/performed Overall Cognitive Status: Within Functional Limits for tasks assessed                                        Exercises General Exercises - Lower Extremity Ankle Circles/Pumps: Seated;AROM;Both;10 reps Long Arc Quad: Seated;AROM;Both;10 reps Hip Flexion/Marching: Seated;AROM;Both;10 reps    General Comments        Pertinent Vitals/Pain Pain Assessment: No/denies pain    Home Living                      Prior Function            PT Goals (current goals can now  be found in the care plan section) Acute Rehab PT Goals Patient Stated Goal: return home with neighbors and friends to help PT Goal Formulation: With patient Time For Goal Achievement: 05/25/17 Potential to Achieve Goals: Good Progress towards PT goals: Progressing toward goals    Frequency    Min 3X/week      PT Plan Current plan remains appropriate    Co-evaluation              AM-PAC PT "6 Clicks" Daily Activity  Outcome Measure  Difficulty turning over in bed (including adjusting bedclothes, sheets and blankets)?: None Difficulty  moving from lying on back to sitting on the side of the bed? : None Difficulty sitting down on and standing up from a chair with arms (e.g., wheelchair, bedside commode, etc,.)?: None Help needed moving to and from a bed to chair (including a wheelchair)?: A Little Help needed walking in hospital room?: A Little Help needed climbing 3-5 steps with a railing? : A Little 6 Click Score: 21    End of Session   Activity Tolerance: Patient tolerated treatment well Patient left: in bed;with call bell/phone within reach (seated at bedside) Nurse Communication: Mobility status PT Visit Diagnosis: Unsteadiness on feet (R26.81);Other abnormalities of gait and mobility (R26.89);Muscle weakness (generalized) (M62.81)     Time: 3875-6433 PT Time Calculation (min) (ACUTE ONLY): 17 min  Charges:  $Therapeutic Activity: 8-22 mins                    G Codes:  Functional Assessment Tool Used: AM-PAC 6 Clicks Basic Mobility Functional Limitation: Mobility: Walking and moving around Mobility: Walking and Moving Around Current Status (I9518): At least 20 percent but less than 40 percent impaired, limited or restricted Mobility: Walking and Moving Around Goal Status [phone]): At least 20 percent but less than 40 percent impaired, limited or restricted Mobility: Walking and Moving Around Discharge Status [id number]): At least 20 percent but less than 40 percent impaired, limited or restricted    3:15 PM, 05/22/17 Lonell Grandchild, MPT Physical Therapist with [facility] [phone] office [phone] mobile phone

## 2017-05-22 NOTE — Procedures (Addendum)
  [place] A. Merlene Laughter, MD     [email or web address]           HISTORY: The patient is a 76 year old female who presents with a spell of altered mental status and syncope. This study is being done to evaluate for seizures.  MEDICATIONS: Scheduled Meds: Continuous Infusions: PRN Meds:.  Prior to Admission medications   Medication Sig Start Date End Date Taking? Authorizing Provider  ADVAIR DISKUS 250-50 MCG/DOSE AEPB Inhale 1 puff into the lungs 2 (two) times daily. 04/18/17   [provider]  albuterol (PROVENTIL HFA;VENTOLIN HFA) 108 (90 BASE) MCG/ACT inhaler Inhale 1-2 puffs into the lungs every 6 (six) hours as needed for wheezing or shortness of breath.    [provider]  aspirin EC 81 MG tablet Take 1 tablet (81 mg total) by mouth daily. Patient taking differently: Take 325 mg by mouth daily.  08/01/13   Hosie Poisson, MD  buPROPion (WELLBUTRIN XL) 300 MG 24 hr tablet Take 300 mg by mouth daily.    [provider]  cyclobenzaprine (FLEXERIL) 10 MG tablet Take 1 tablet by mouth 3 (three) times daily. 05/15/17   [provider]  FLUoxetine (PROZAC) 10 MG capsule Take 1 capsule by mouth daily. 05/04/17   [provider]  furosemide (LASIX) 20 MG tablet Take 20 mg by mouth daily as needed for fluid or edema.    [provider]  gabapentin (NEURONTIN) 300 MG capsule Take 600 mg by mouth 3 (three) times daily.     [provider]  HYDROcodone-acetaminophen (NORCO) 10-325 MG per tablet Take 1 tablet by mouth every 6 (six) hours as needed for moderate pain.    [provider]  Multiple Vitamin (MULTIVITAMIN WITH MINERALS) TABS tablet Take 1 tablet by mouth daily.    [provider]  Multiple Vitamins-Minerals (ICAPS PO) Take 1 tablet by mouth daily.    [provider]  Omega-3 Fatty Acids (FISH OIL) 1000 MG CAPS Take 3 capsules by mouth daily.    [provider]  zolpidem  (AMBIEN) 10 MG tablet Take 1 tablet by mouth at bedtime. 04/28/17   [provider]      ANALYSIS: A 16 channel recording using standard 10 20 measurements is conducted for 21 minutes. There is a well-formed posterior dominant rhythm of 7 1/2 Hertz which attenuates with eye opening. There is beta activity observing the frontal areas. Awake and drowsy activities are observed. Photic simulation and hypoventilation are not conducted. There is no focal or lateral slowing. There is no epileptiform activities observed.   IMPRESSION: This recording shows mild global slowing for age. Otherwise, the recording is unrevealing.      Storm Dulski A. Merlene Laughter, M.D.  Diplomate, Tax adviser of Psychiatry and Neurology ( Neurology).

## 2017-09-25 ENCOUNTER — Other Ambulatory Visit (HOSPITAL_COMMUNITY): Payer: Self-pay | Admitting: Family Medicine

## 2017-09-25 DIAGNOSIS — Z1231 Encounter for screening mammogram for malignant neoplasm of breast: Secondary | ICD-10-CM

## 2017-10-03 ENCOUNTER — Ambulatory Visit (HOSPITAL_COMMUNITY): Payer: Medicare Other

## 2017-11-23 ENCOUNTER — Other Ambulatory Visit: Payer: Self-pay | Admitting: Family Medicine

## 2017-11-23 ENCOUNTER — Ambulatory Visit
Admission: RE | Admit: 2017-11-23 | Discharge: 2017-11-23 | Disposition: A | Discharge status: Home / Self Care | Payer: Medicare Other | Source: Ambulatory Visit | Attending: Family Medicine | Admitting: Family Medicine

## 2017-11-23 DIAGNOSIS — M549 Dorsalgia, unspecified: Secondary | ICD-10-CM

## 2018-01-07 ENCOUNTER — Other Ambulatory Visit (HOSPITAL_COMMUNITY): Payer: Self-pay | Admitting: Family Medicine

## 2018-01-07 DIAGNOSIS — M79604 Pain in right leg: Secondary | ICD-10-CM

## 2018-01-07 DIAGNOSIS — M7989 Other specified soft tissue disorders: Secondary | ICD-10-CM

## 2018-01-08 ENCOUNTER — Encounter (HOSPITAL_COMMUNITY): Payer: Medicare Other

## 2018-01-08 ENCOUNTER — Telehealth: Payer: Self-pay

## 2018-01-08 NOTE — Telephone Encounter (Signed)
SENT REFERRAL TO SCHEDULING 

## 2018-01-10 ENCOUNTER — Ambulatory Visit (HOSPITAL_COMMUNITY)
Admission: RE | Admit: 2018-01-10 | Discharge: 2018-01-10 | Disposition: A | Discharge status: Home / Self Care | Payer: Medicare Other | Source: Ambulatory Visit | Attending: Family Medicine | Admitting: Family Medicine

## 2018-01-10 DIAGNOSIS — M7989 Other specified soft tissue disorders: Secondary | ICD-10-CM | POA: Insufficient documentation

## 2018-01-10 DIAGNOSIS — M79604 Pain in right leg: Secondary | ICD-10-CM | POA: Diagnosis present

## 2018-01-10 NOTE — Progress Notes (Signed)
*  Preliminary Results* Right lower extremity venous duplex completed. Right lower extremity is negative for deep vein thrombosis. There is no evidence of right Baker's cyst.  01/10/2018 9:54 AM  Maudry Mayhew, BS, RVT, RDCS, RDMS

## 2018-02-25 ENCOUNTER — Ambulatory Visit: Payer: Medicare Other | Admitting: Cardiovascular Disease

## 2018-02-27 ENCOUNTER — Encounter: Payer: Self-pay | Admitting: Cardiovascular Disease

## 2018-08-08 ENCOUNTER — Emergency Department (HOSPITAL_COMMUNITY)
Admission: EM | Admit: 2018-08-08 | Discharge: 2018-08-08 | Disposition: A | Discharge status: Home / Self Care | Payer: Medicare Other | Attending: Emergency Medicine | Admitting: Emergency Medicine

## 2018-08-08 ENCOUNTER — Emergency Department (HOSPITAL_COMMUNITY): Payer: Medicare Other

## 2018-08-08 ENCOUNTER — Encounter (HOSPITAL_COMMUNITY): Payer: Self-pay

## 2018-08-08 ENCOUNTER — Other Ambulatory Visit: Payer: Self-pay

## 2018-08-08 DIAGNOSIS — M545 Low back pain, unspecified: Secondary | ICD-10-CM

## 2018-08-08 DIAGNOSIS — Z79899 Other long term (current) drug therapy: Secondary | ICD-10-CM | POA: Insufficient documentation

## 2018-08-08 DIAGNOSIS — R51 Headache: Secondary | ICD-10-CM | POA: Diagnosis present

## 2018-08-08 DIAGNOSIS — G8929 Other chronic pain: Secondary | ICD-10-CM | POA: Diagnosis not present

## 2018-08-08 DIAGNOSIS — Z87891 Personal history of nicotine dependence: Secondary | ICD-10-CM | POA: Insufficient documentation

## 2018-08-08 DIAGNOSIS — J449 Chronic obstructive pulmonary disease, unspecified: Secondary | ICD-10-CM | POA: Diagnosis not present

## 2018-08-08 DIAGNOSIS — M25461 Effusion, right knee: Secondary | ICD-10-CM | POA: Diagnosis not present

## 2018-08-08 DIAGNOSIS — Z7982 Long term (current) use of aspirin: Secondary | ICD-10-CM | POA: Diagnosis not present

## 2018-08-08 LAB — CBC WITH DIFFERENTIAL/PLATELET
Abs Immature Granulocytes: 0.02 10*3/uL (ref 0.00–0.07)
Basophils Absolute: 0.1 10*3/uL (ref 0.0–0.1)
Basophils Relative: 2 %
Eosinophils Absolute: 0.2 10*3/uL (ref 0.0–0.5)
Eosinophils Relative: 3 %
HCT: 41.3 % (ref 36.0–46.0)
Hemoglobin: 12.5 g/dL (ref 12.0–15.0)
Immature Granulocytes: 0 %
Lymphocytes Relative: 32 %
Lymphs Abs: 1.9 10*3/uL (ref 0.7–4.0)
MCH: 30.6 pg (ref 26.0–34.0)
MCHC: 30.3 g/dL (ref 30.0–36.0)
MCV: 101.2 fL — ABNORMAL HIGH (ref 80.0–100.0)
Monocytes Absolute: 0.5 10*3/uL (ref 0.1–1.0)
Monocytes Relative: 8 %
Neutro Abs: 3.3 10*3/uL (ref 1.7–7.7)
Neutrophils Relative %: 55 %
Platelets: 275 10*3/uL (ref 150–400)
RBC: 4.08 MIL/uL (ref 3.87–5.11)
RDW: 14.7 % (ref 11.5–15.5)
WBC: 5.9 10*3/uL (ref 4.0–10.5)
nRBC: 0 % (ref 0.0–0.2)

## 2018-08-08 LAB — BASIC METABOLIC PANEL
Anion gap: 6 (ref 5–15)
BUN: 18 mg/dL (ref 8–23)
CO2: 26 mmol/L (ref 22–32)
Calcium: 8.5 mg/dL — ABNORMAL LOW (ref 8.9–10.3)
Chloride: 106 mmol/L (ref 98–111)
Creatinine, Ser: 0.92 mg/dL (ref 0.44–1.00)
GFR calc Af Amer: >60 mL/min (ref >60)
GFR calc non Af Amer: 59 mL/min — ABNORMAL LOW (ref >60)
Glucose, Bld: 89 mg/dL (ref 70–99)
Potassium: 3.9 mmol/L (ref 3.5–5.1)
Sodium: 138 mmol/L (ref 135–145)

## 2018-08-08 LAB — URINALYSIS, ROUTINE W REFLEX MICROSCOPIC
Bacteria, UA: NONE SEEN
Bilirubin Urine: NEGATIVE
Glucose, UA: NEGATIVE mg/dL
Ketones, ur: NEGATIVE mg/dL
Leukocytes, UA: NEGATIVE
Nitrite: NEGATIVE
Protein, ur: NEGATIVE mg/dL
Specific Gravity, Urine: 1.01 (ref 1.005–1.030)
pH: 5 (ref 5.0–8.0)

## 2018-08-08 NOTE — ED Notes (Signed)
Patient transported to X-ray 

## 2018-08-08 NOTE — Discharge Instructions (Addendum)
Your x-rays here show no signs of new fractures or dislocations You do have severe arthritis of your knee No signs of head injury on the brain scan Please continue to take your medications for pain as needed, a stool softener if you are becoming constipated from the pain medicines Continue to walk with your walker See your doctor within 1 week for a recheck and a potential injection of your knee if you are still having pain.  Emergency department for increasing pain fever weakness numbness or inability to walk

## 2018-08-08 NOTE — ED Provider Notes (Signed)
[facility] EMERGENCY DEPARTMENT Provider Note   CSN: [id number] Arrival date & time: 08/08/18  0947     History   Chief Complaint Chief Complaint  Patient presents with  . Back Pain    HPI Meagan Holt is a 77 y.o. female.  HPI  The patient is a 77 year old female, she has a history of back pain after having a fall on Saturday, she was in the parking lot after coming out of the grocery store, she fell backwards striking her lower back and then bumping her head on the ground.  She did not lose consciousness, she was able to get up from the ground with the help of the person that was with her and has been ambulating this week with a walker.  She says that she sometimes has to use the walker, that is not unusual.  She does have some chronic right-sided knee pain which seems a little bit worse today as well.  She denies fevers numbness or weakness.  The pain is in the lower back and radiates into the legs but does not radiate up her spine and she has no arm weakness.  She has no headache, she has no blurred vision, she has no chest pain or shortness of breath.  She does endorse having a history of some type of extra heartbeats that occur occasionally.  She does not know the name of this.  Review of the medical record does not show any specific cardiac diagnosis.  The patient does endorse having prior back surgery a couple of times over many many years.  Past Medical History:  Diagnosis Date  . Arthritis    "all my joints" (07/30/2013)  . Chronic back pain   . Chronic bronchitis ([place])    "get it q year" (07/30/2013)  . COPD (chronic obstructive pulmonary disease) ([place])   . Depression   . Falls frequently    "fell twice in the last 2 days" (07/30/2013)  . GERD (gastroesophageal reflux disease)    "rarely" (07/30/2013)  . H/O hiatal hernia   . History of blood transfusion   . Kidney stones   . Stroke [facility])     Patient Active Problem List   Diagnosis Date Noted  .  Orthostatic hypotension 05/22/2017  . Syncope and collapse 05/21/2017  . Chronic pain syndrome 05/21/2017  . Altered mental status 05/20/2017  . GERD (gastroesophageal reflux disease) 05/20/2017  . Depression 05/20/2017  . History of stroke 05/20/2017  . Premature beats 05/20/2017  . Head injury 07/30/2013  . Difficulty walking 07/30/2013  . COPD (chronic obstructive pulmonary disease) ([place]) 07/30/2013    Past Surgical History:  Procedure Laterality Date  . APPENDECTOMY    . BACK SURGERY     "put cages in mid back; did arthroscopic OR on lower back" (11/326/2014)  . BILATERAL OOPHORECTOMY Bilateral   . CATARACT EXTRACTION W/ INTRAOCULAR LENS  IMPLANT, BILATERAL Bilateral   . CHOLECYSTECTOMY    . EXCISIONAL HEMORRHOIDECTOMY    . GASTRIC BYPASS    . HERNIA REPAIR     "removed my belly button too" (07/30/2013)  . KIDNEY STONE SURGERY     "twice" (07/30/2013)  . KNEE ARTHROSCOPY Left   . TONSILLECTOMY       OB History   None      Home Medications    Prior to Admission medications   Medication Sig Start Date End Date Taking? Authorizing Provider  ADVAIR DISKUS 250-50 MCG/DOSE AEPB Inhale 1 puff into the lungs 2 (  two) times daily. 04/18/17  Yes [provider]  albuterol (PROVENTIL HFA;VENTOLIN HFA) 108 (90 BASE) MCG/ACT inhaler Inhale 1-2 puffs into the lungs every 6 (six) hours as needed for wheezing or shortness of breath.   Yes [provider]  aspirin EC 325 MG tablet Take 325 mg by mouth every morning.   Yes [provider]  buPROPion (WELLBUTRIN XL) 300 MG 24 hr tablet Take 300 mg by mouth at bedtime.    Yes [provider]  cyclobenzaprine (FLEXERIL) 10 MG tablet Take 1 tablet by mouth 3 (three) times daily. 05/15/17  Yes [provider]  FLUoxetine (PROZAC) 10 MG capsule Take 1 capsule by mouth every morning.  05/04/17  Yes [provider]  furosemide (LASIX) 20 MG tablet Take 20 mg by mouth daily as needed for fluid  or edema.   Yes [provider]  gabapentin (NEURONTIN) 300 MG capsule Take 900 mg by mouth 2 (two) times daily.    Yes [provider]  HYDROcodone-acetaminophen (NORCO) 10-325 MG per tablet Take 1 tablet by mouth every 6 (six) hours as needed for moderate pain.   Yes [provider]  ibuprofen (ADVIL,MOTRIN) 600 MG tablet Take 1 tablet by mouth 3 (three) times daily as needed for mild pain or moderate pain.  08/06/18  Yes [provider]  Multiple Vitamin (MULTIVITAMIN WITH MINERALS) TABS tablet Take 1 tablet by mouth daily.   Yes [provider]  Multiple Vitamins-Minerals (ICAPS PO) Take 1 tablet by mouth daily.   Yes [provider]  Omega-3 Fatty Acids (FISH OIL) 1000 MG CAPS Take 3 capsules by mouth daily.   Yes [provider]  Polyethyl Glycol-Propyl Glycol (SYSTANE) 0.4-0.3 % SOLN Apply 1-2 drops to eye daily as needed (for dry eye relief).   Yes [provider]  zolpidem (AMBIEN) 10 MG tablet Take 10 mg by mouth at bedtime as needed for sleep.  07/30/18  Yes [provider]    Family History Family History  Problem Relation Age of Onset  . Stomach cancer Father     Social History Social History   Tobacco Use  . Smoking status: Former Smoker    Packs/day: 3.00    Years: 42.00    Pack years: 126.00    Types: Cigarettes  . Smokeless tobacco: Never Used  Substance Use Topics  . Alcohol use: Yes    Comment: rarely   . Drug use: No     Allergies   Latex and Doxycycline   Review of Systems Review of Systems  All other systems reviewed and are negative.    Physical Exam Updated Vital Signs BP 99/73   Pulse 65   Temp 98.4 F (36.9 C) (Oral)   Resp 16   Ht 1.727 m (5\' 8" )   Wt 87.1 kg   SpO2 92%   BMI 29.19 kg/m   Physical Exam  Constitutional: She appears well-developed and well-nourished. No distress.  HENT:  Head: Normocephalic and atraumatic.  Mouth/Throat: Oropharynx is  clear and moist. No oropharyngeal exudate.  Eyes: Pupils are equal, round, and reactive to light. Conjunctivae and EOM are normal. Right eye exhibits no discharge. Left eye exhibits no discharge. No scleral icterus.  Neck: Normal range of motion. Neck supple. No JVD present. No thyromegaly present.  Cardiovascular: Normal rate, normal heart sounds and intact distal pulses. Exam reveals no gallop and no friction rub.  No murmur heard. Occasional ectopy, normal pulses at the radial arteries, no edema  Pulmonary/Chest: Effort normal and breath sounds normal. No respiratory distress. She has no wheezes. She has no rales.  Abdominal: Soft. Bowel sounds are normal. She exhibits no distension and no mass. There is no tenderness.  Musculoskeletal: Normal range of motion. She exhibits tenderness. She exhibits no edema.  Mild tenderness over the lumbar area and the paraspinal muscles.  She has some increased swelling of the right knee with decreased range of motion secondary to pain, states this is chronic but worsened today.  She is able to straight leg raise bilaterally  Lymphadenopathy:    She has no cervical adenopathy.  Neurological: She is alert. Coordination normal.  Mental status is normal, she is alert and awake and following commands, her arms are totally normal and there strength and sensation, the left lower extremity is able to be raised off the bed without pain in the back or pain in the leg with good range of motion.  Her right lower extremity has more difficulty with pain from the knee down.  There is no obvious deformity of the leg, she has normal sensation of the leg, she has decreased range of motion with pain and palpation over the patella.  Skin: Skin is warm and dry. No rash noted. No erythema.  Psychiatric: She has a normal mood and affect. Her behavior is normal.  Nursing note and vitals reviewed.    ED Treatments / Results  Labs (all labs ordered are listed, but only abnormal  results are displayed) Labs Reviewed  CBC WITH DIFFERENTIAL/PLATELET - Abnormal; Notable for the following components:      Result Value   MCV 101.2 (*)    All other components within normal limits  BASIC METABOLIC PANEL - Abnormal; Notable for the following components:   Calcium 8.5 (*)    GFR calc non Af Amer 59 (*)    All other components within normal limits  URINALYSIS, ROUTINE W REFLEX MICROSCOPIC - Abnormal; Notable for the following components:   Color, Urine STRAW (*)    Hgb urine dipstick MODERATE (*)    All other components within normal limits    EKG None  Radiology Dg Lumbar Spine Complete  Result Date: 08/08/2018 CLINICAL DATA:  Back pain post fall 1 week ago. EXAM: LUMBAR SPINE - COMPLETE 4+ VIEW COMPARISON:  05/20/2017 FINDINGS: There is no definite evidence of acute fracture of the lumbosacral spine, however the evaluation is made difficult due to scoliotic changes and prior postsurgical changes of the lumbosacral spine. Disc spacer devices are again seen at L3-L4, L4-L5 and L5-S1. A disc spacer device is also present and L2-L3. There is chronic appearing from 05/20/2017 decreased height of L3 vertebral body. Multilevel osteoarthritic changes. Extensive postsurgical changes in the mid abdomen. Advanced calcific atherosclerotic disease of the aorta. IMPRESSION: No definite evidence of acute fracture of the lumbosacral spine, however the evaluation is inherently difficult due to postsurgical changes, osteoarthritic changes and scoliotic deformity of the lumbosacral spine. Question chronic compression deformity of L3 vertebral body. Electronically Signed   By: Fidela Salisbury M.D.   On: 08/08/2018 13:04   Ct Head Wo Contrast  Result Date: 08/08/2018 CLINICAL DATA:  [place] in the parking lot 6 days ago with trauma to the posterior head. Headache and dizziness since then. EXAM: CT HEAD WITHOUT CONTRAST TECHNIQUE: Contiguous axial images were obtained from the base of the skull  through the vertex without intravenous contrast. COMPARISON:  12/28/2017 FINDINGS: Brain: Age related atrophy. No evidence of old or acute focal infarction,  mass lesion, hemorrhage, hydrocephalus or extra-axial collection. Vascular: There is atherosclerotic calcification of the major vessels at the base of the brain. Skull: Normal Sinuses/Orbits: Clear/normal Other: None IMPRESSION: No acute or traumatic finding. Age related volume loss. No intracranial hemorrhage or extra-axial collection. No skull fracture. Electronically Signed   By: Nelson Chimes M.D.   On: 08/08/2018 13:16   Dg Knee Complete 4 Views Right  Result Date: 08/08/2018 CLINICAL DATA:  Swelling.  Limited range of motion.  Pain. EXAM: RIGHT KNEE - COMPLETE 4+ VIEW COMPARISON:  None. FINDINGS: Tricompartmental degenerative changes and chondrocalcinosis. Vascular calcifications. No fractures or definitive effusions. No other acute abnormalities. IMPRESSION: No fracture or significant effusion. Tricompartmental degenerative changes. Electronically Signed   By: Dorise Bullion III M.D   On: 08/08/2018 15:01    Procedures Procedures (including critical care time)  Medications Ordered in ED Medications - No data to display   Initial Impression / Assessment and Plan / ED Course  I have reviewed the triage vital signs and the nursing notes.  Pertinent labs & imaging results that were available during my care of the patient were reviewed by me and considered in my medical decision making (see chart for details).  Clinical Course as of Aug 08 1510  Thu Aug 08, 2018  1508 Urinalysis negative, CBC without any leukocytosis or anemia, metabolic panel is also unremarkable with no kidney dysfunction.   [BM]  1509 Patient does have some fairly severe arthritis of the knee but no signs of fracture.  Lumbar spine and CT scan of the head were also evaluated without any signs of acute fracture.  The patient is given reassurance and at this time is  stable for discharge.  She already has hydrocodone at home   [BM]    Clinical Course User Index [BM] Noemi Chapel, MD    The patient has had imaging of her lower back and her head, will add a right knee x-ray.  Pain is been controlled with hydrocodone at home.  She is requesting a steroid shot in her knee.  I referred her to orthopedics  Final Clinical Impressions(s) / ED Diagnoses   Final diagnoses:  Chronic bilateral low back pain without sciatica     Noemi Chapel, MD 08/08/18 1511

## 2018-08-08 NOTE — ED Notes (Signed)
Patient transported to CT 

## 2018-08-08 NOTE — ED Provider Notes (Signed)
MSE was initiated and I personally evaluated the patient and placed orders (if any) at  11:28 AM on August 08, 2018.  The patient appears stable so that the remainder of the MSE may be completed by another provider.  Patient initially presented to the fast track side of the emergency department.  She describes back pain since a fall out of a van 1 week ago.  She was attempting to get in the [place] when she fell backwards landing on her back.  She is unsure of the reason for this fall but states she has had fairly constant dizziness with positional changes of her head in association with the right sided headache since this event.  She does have a distant history of a CVA, she will need a more advanced work-up then can be reasonably completed on the fast track side.  Initial orders placed.   Evalee Jefferson, PA-C 08/08/18 1129    Milton Ferguson, MD 08/08/18 [phone]

## 2018-08-08 NOTE — ED Triage Notes (Signed)
Pt presents to ED with complaints of lower back pain which radiates down both legs following fall last Saturday.

## 2018-08-08 NOTE — ED Notes (Signed)
Patient ambulatory to restroom with assistance.  Patient stated she normally walks with a walker.

## 2018-08-23 IMAGING — CT CT HEAD W/O CM
5 of 7 series · 17 of 47 positions shown, 18 images · non-contrast
Comparison: 11/07/2016

CLINICAL DATA: Patient found laying face down on floor rate in the
pill bottles on floor. Lethargy. Head trauma. Altered level of
consciousness.

EXAM:
CT HEAD WITHOUT CONTRAST
CT CERVICAL SPINE WITHOUT CONTRAST
TECHNIQUE: Multidetector CT imaging of the head and cervical spine was
performed following the standard protocol without intravenous
contrast. Multiplanar CT image reconstructions of the cervical spine
were also generated.

[Series 3: head wo · axial · 0.41mm/px · z∈[+34,+109]mm · 3 of 31 slices shown, 4 images]
[im 8/31  brain]
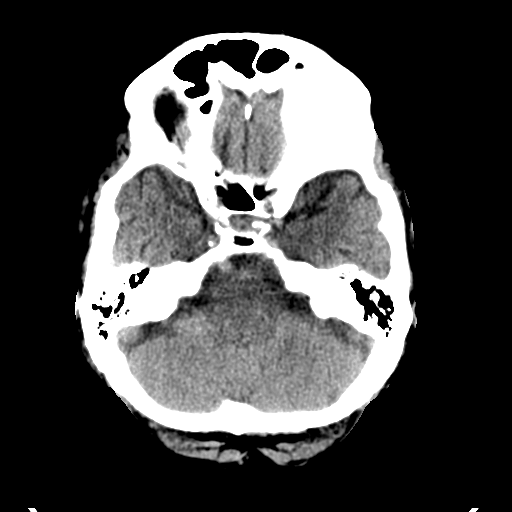
[im 8/31  bone]
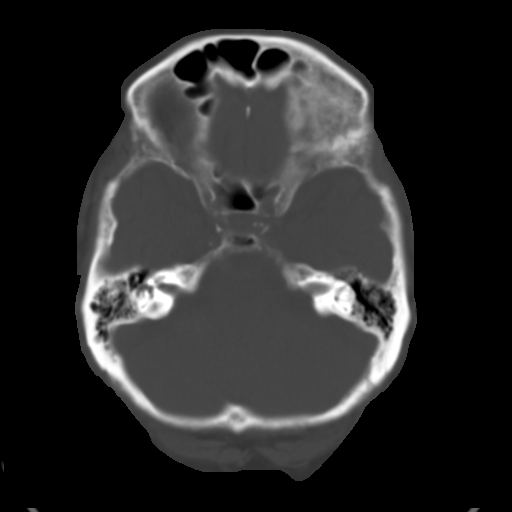
[im 16/31  brain]
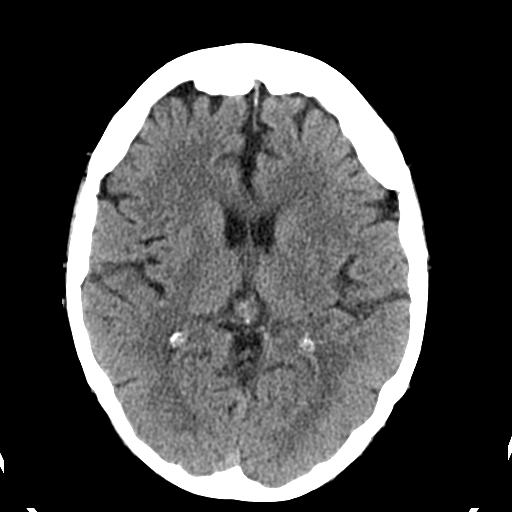
[im 23/31  brain]
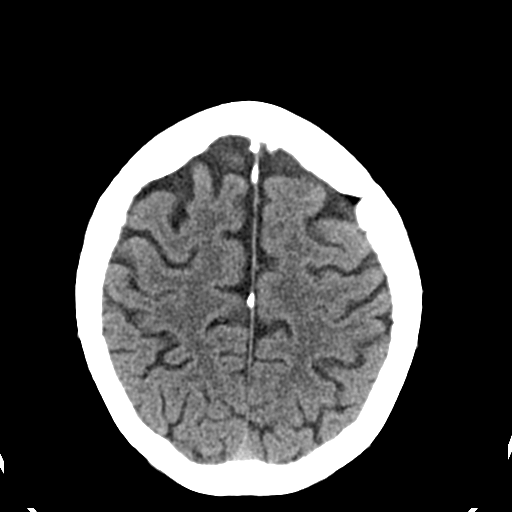

[Series 5: coronal soft tissue · coronal · 0.32mm/px · 3 of 63 slices shown]
[im 16/63  brain]
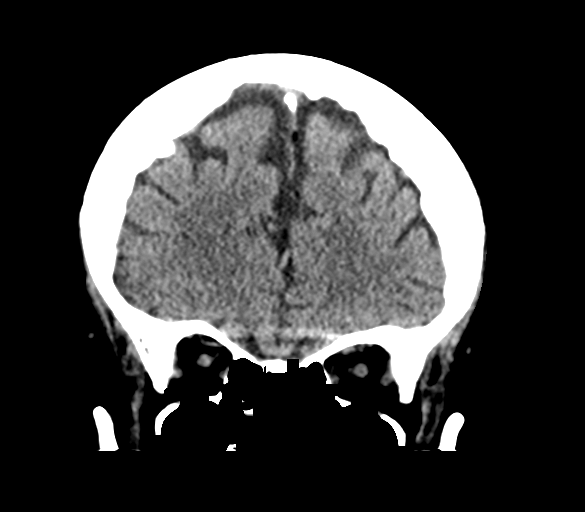
[im 32/63  brain]
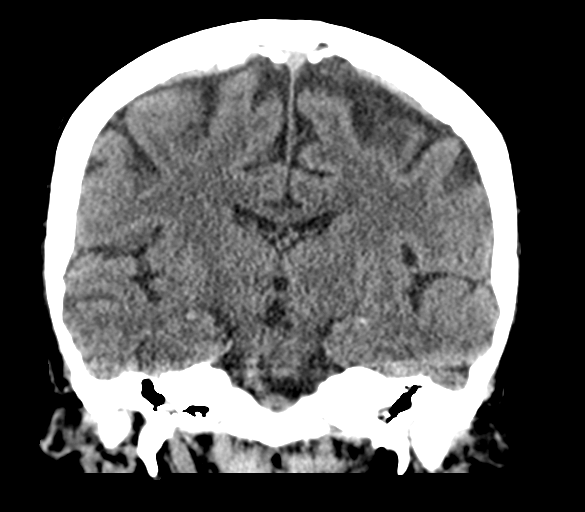
[im 47/63  brain]
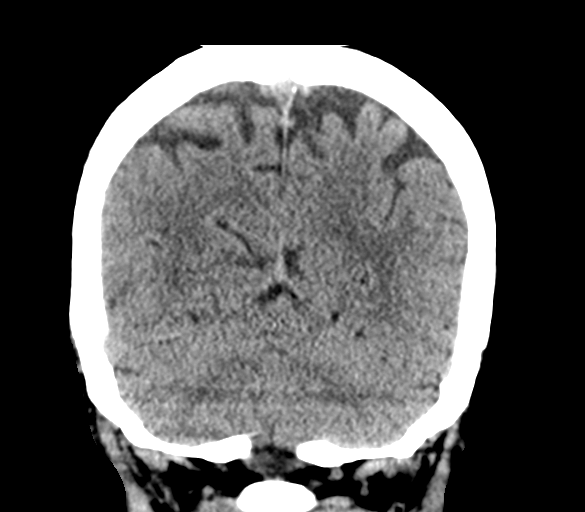

[Series 6: sagittal soft tissue · sagittal · 0.32mm/px · 1 of 56 slices shown]
[im 28/56  brain]
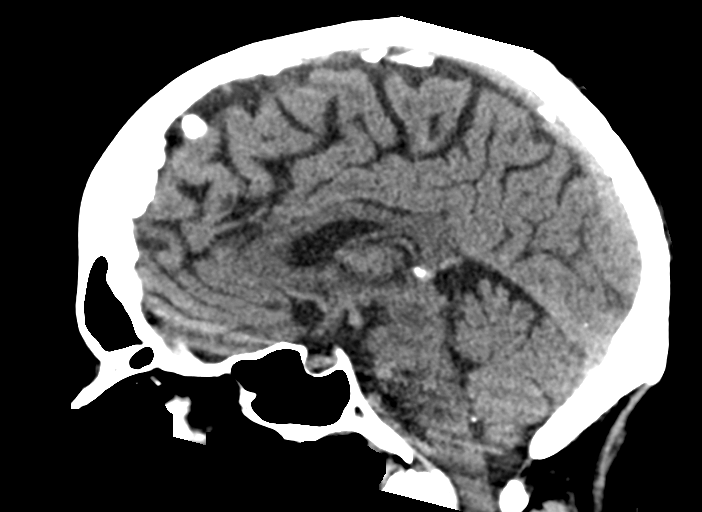

[Series 8: c spine soft · axial · 0.30mm/px · z∈[-156,-128]mm · 2 of 88 slices shown]
[im 8/88  brain]
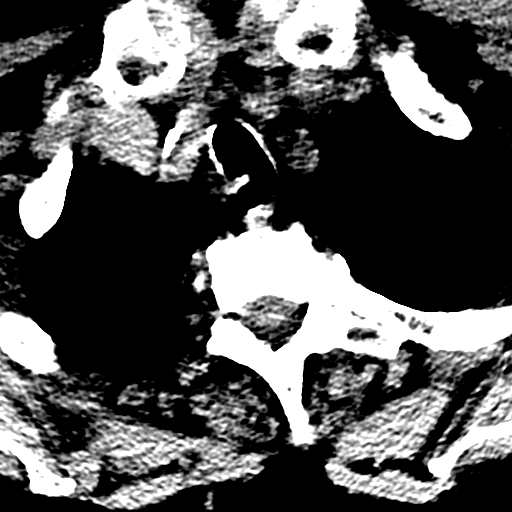
[im 22/88  brain]
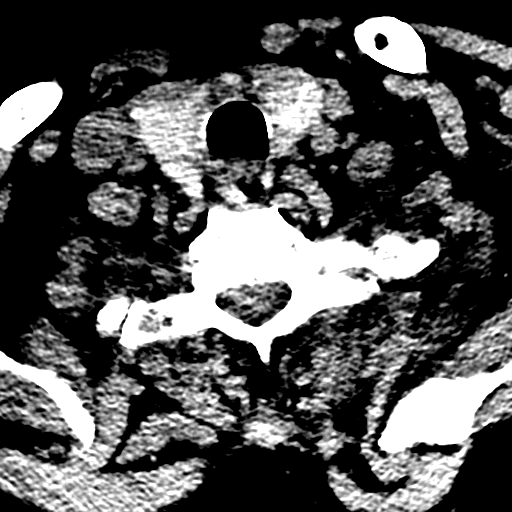

[Series 11: orthogonal bone · axial · 0.21mm/px · z∈[-169,-39]mm · 8 of 84 slices shown]
[im 8/84  bone]
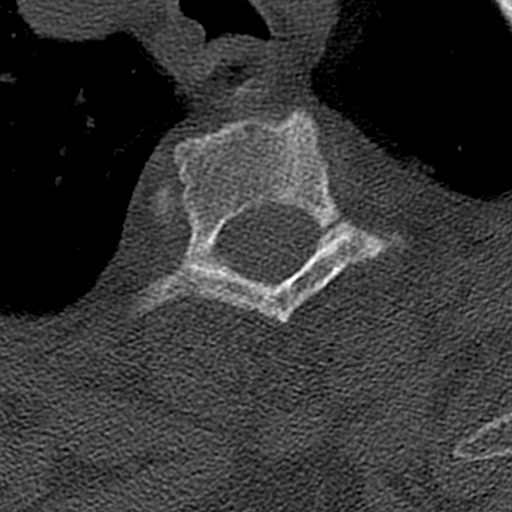
[im 16/84  bone]
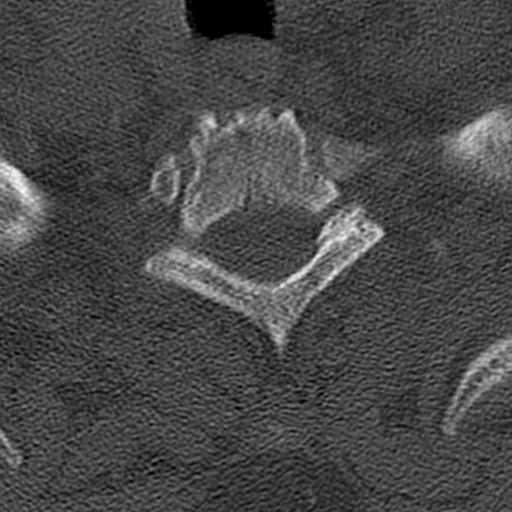
[im 31/84  bone]
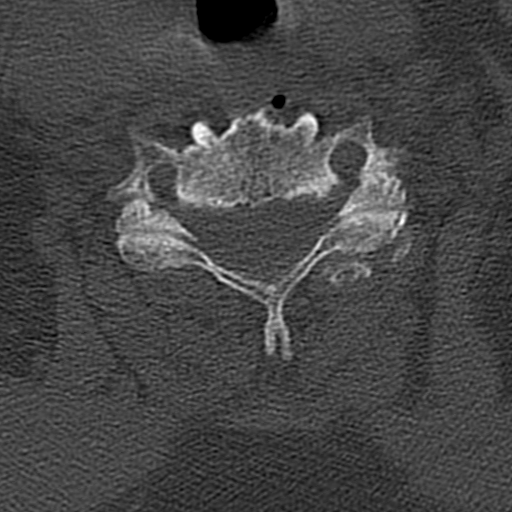
[im 38/84  bone]
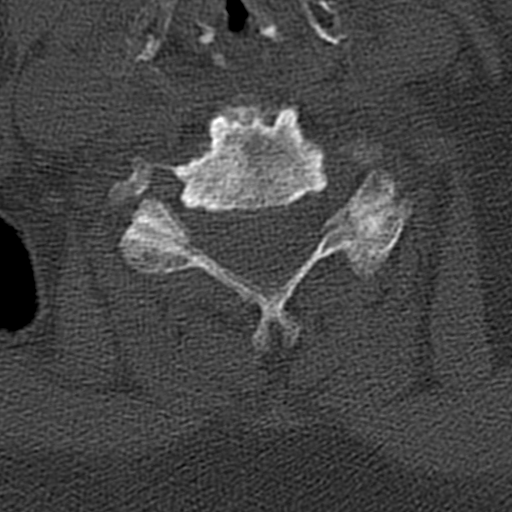
[im 46/84  bone]
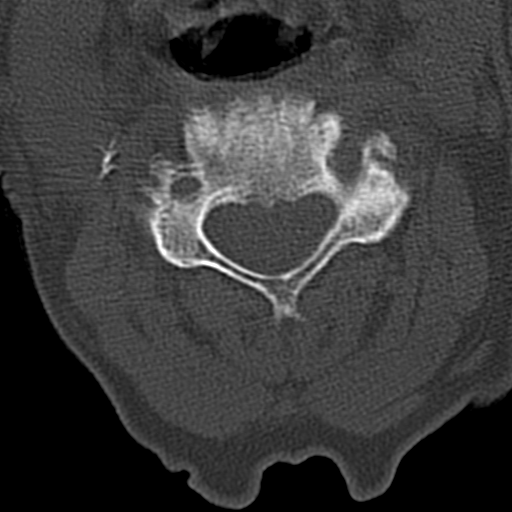
[im 53/84  bone]
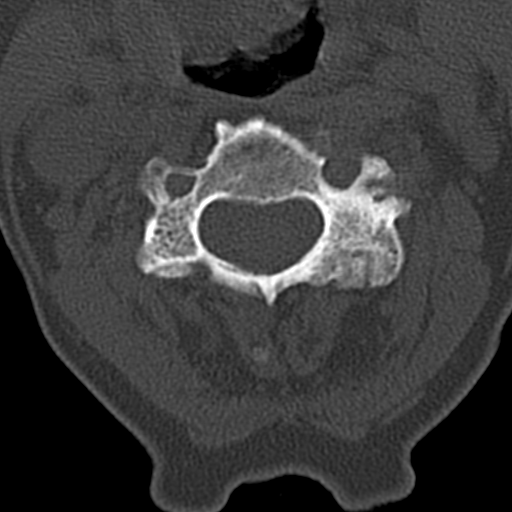
[im 68/84  bone]
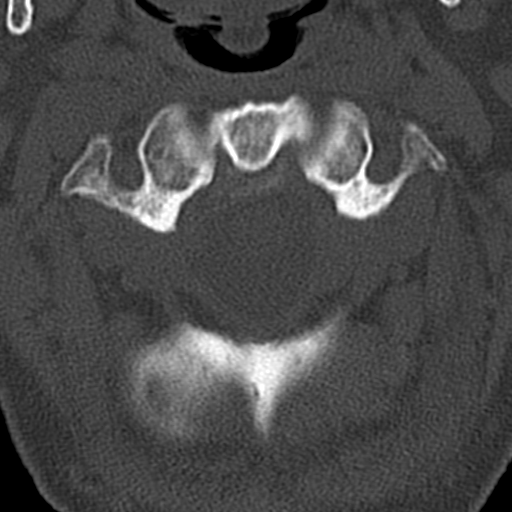
[im 76/84  bone]
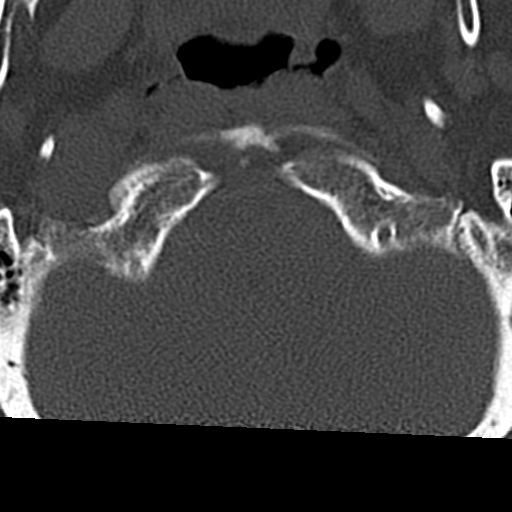

[17 of 47 positions shown; findings below may reference images not displayed]

FINDINGS: CT HEAD FINDINGS

Brain: The brainstem, cerebellum, cerebral peduncles, thalami, basal
ganglia, basilar cisterns, and ventricular system appear within
normal limits. No intracranial hemorrhage, mass lesion, or acute
CVA.

Vascular: There is atherosclerotic calcification of the cavernous
carotid arteries bilaterally.

Skull: Unremarkable

Sinuses/Orbits: Unremarkable

Other: No supplemental non-categorized findings.

CT CERVICAL SPINE FINDINGS

Alignment: 3 mm degenerative grade 1 anterolisthesis at C2- 3, no
change from 11/07/2016. Straightening of the normal cervical
lordosis.

Skull base and vertebrae: Prominent loss of intervertebral disc
height at all levels between C3 and T3. Multilevel posterior osseous
ridging. Endplate sclerosis. Multilevel facet arthropathy. Pannus
posterior to the odontoid.

No appreciable fracture. No significant change from the 11/07/2016
exam.

Soft tissues and spinal canal: No prevertebral soft tissue swelling.

Disc levels: Uncinate and facet spurring cause osseous foraminal
stenosis bilaterally at C3-4, C4-5, C5-6, and C6-7.

Upper chest: Unremarkable

Other: No supplemental non-categorized findings.
IMPRESSION: 1. No acute intracranial findings or acute cervical spine findings.
2. Stable cervical spondylosis and degenerative disc disease with
multilevel impingement.
3. 3 mm of degenerative grade 1 anterolisthesis at C2- 3, unchanged
from November 2016.

## 2018-09-08 DIAGNOSIS — H8110 Benign paroxysmal vertigo, unspecified ear: Secondary | ICD-10-CM | POA: Insufficient documentation

## 2018-09-08 DIAGNOSIS — L03115 Cellulitis of right lower limb: Secondary | ICD-10-CM | POA: Insufficient documentation

## 2019-01-30 ENCOUNTER — Emergency Department (HOSPITAL_COMMUNITY): Payer: Medicare Other

## 2019-01-30 ENCOUNTER — Encounter (HOSPITAL_COMMUNITY): Payer: Self-pay

## 2019-01-30 ENCOUNTER — Emergency Department (HOSPITAL_COMMUNITY)
Admission: EM | Admit: 2019-01-30 | Discharge: 2019-02-04 | Disposition: A | Discharge status: Home / Self Care | Payer: Medicare Other | Attending: Emergency Medicine | Admitting: Emergency Medicine

## 2019-01-30 ENCOUNTER — Other Ambulatory Visit: Payer: Self-pay

## 2019-01-30 DIAGNOSIS — Z91040 Latex allergy status: Secondary | ICD-10-CM | POA: Diagnosis not present

## 2019-01-30 DIAGNOSIS — J449 Chronic obstructive pulmonary disease, unspecified: Secondary | ICD-10-CM | POA: Insufficient documentation

## 2019-01-30 DIAGNOSIS — R4182 Altered mental status, unspecified: Secondary | ICD-10-CM | POA: Insufficient documentation

## 2019-01-30 DIAGNOSIS — Z87891 Personal history of nicotine dependence: Secondary | ICD-10-CM | POA: Diagnosis not present

## 2019-01-30 DIAGNOSIS — Z20828 Contact with and (suspected) exposure to other viral communicable diseases: Secondary | ICD-10-CM | POA: Insufficient documentation

## 2019-01-30 DIAGNOSIS — Z8673 Personal history of transient ischemic attack (TIA), and cerebral infarction without residual deficits: Secondary | ICD-10-CM | POA: Diagnosis not present

## 2019-01-30 DIAGNOSIS — Z79899 Other long term (current) drug therapy: Secondary | ICD-10-CM | POA: Diagnosis not present

## 2019-01-30 DIAGNOSIS — Z7982 Long term (current) use of aspirin: Secondary | ICD-10-CM | POA: Diagnosis not present

## 2019-01-30 DIAGNOSIS — T50905A Adverse effect of unspecified drugs, medicaments and biological substances, initial encounter: Secondary | ICD-10-CM

## 2019-01-30 DIAGNOSIS — M6281 Muscle weakness (generalized): Secondary | ICD-10-CM | POA: Diagnosis not present

## 2019-01-30 DIAGNOSIS — R531 Weakness: Secondary | ICD-10-CM | POA: Diagnosis present

## 2019-01-30 LAB — COMPREHENSIVE METABOLIC PANEL
ALT: 15 U/L (ref 0–44)
AST: 19 U/L (ref 15–41)
Albumin: 3.6 g/dL (ref 3.5–5.0)
Alkaline Phosphatase: 83 U/L (ref 38–126)
Anion gap: 10 (ref 5–15)
BUN: 17 mg/dL (ref 8–23)
CO2: 22 mmol/L (ref 22–32)
Calcium: 8.8 mg/dL — ABNORMAL LOW (ref 8.9–10.3)
Chloride: 105 mmol/L (ref 98–111)
Creatinine, Ser: 0.67 mg/dL (ref 0.44–1.00)
GFR calc Af Amer: >60 mL/min (ref >60)
GFR calc non Af Amer: >60 mL/min (ref >60)
Glucose, Bld: 100 mg/dL — ABNORMAL HIGH (ref 70–99)
Potassium: 3.5 mmol/L (ref 3.5–5.1)
Sodium: 137 mmol/L (ref 135–145)
Total Bilirubin: 0.8 mg/dL (ref 0.3–1.2)
Total Protein: 6.8 g/dL (ref 6.5–8.1)

## 2019-01-30 LAB — RAPID URINE DRUG SCREEN, HOSP PERFORMED
Amphetamines: NOT DETECTED
Barbiturates: NOT DETECTED
Benzodiazepines: NOT DETECTED
Cocaine: NOT DETECTED
Opiates: POSITIVE — AB
Tetrahydrocannabinol: NOT DETECTED

## 2019-01-30 LAB — URINALYSIS, ROUTINE W REFLEX MICROSCOPIC
Bacteria, UA: NONE SEEN
Bilirubin Urine: NEGATIVE
Glucose, UA: NEGATIVE mg/dL
Ketones, ur: 20 mg/dL — AB
Nitrite: NEGATIVE
Protein, ur: 30 mg/dL — AB
Specific Gravity, Urine: 1.024 (ref 1.005–1.030)
pH: 6 (ref 5.0–8.0)

## 2019-01-30 LAB — CBC WITH DIFFERENTIAL/PLATELET
Abs Immature Granulocytes: 0.05 10*3/uL (ref 0.00–0.07)
Basophils Absolute: 0.1 10*3/uL (ref 0.0–0.1)
Basophils Relative: 1 %
Eosinophils Absolute: 0 10*3/uL (ref 0.0–0.5)
Eosinophils Relative: 0 %
HCT: 46.2 % — ABNORMAL HIGH (ref 36.0–46.0)
Hemoglobin: 14.8 g/dL (ref 12.0–15.0)
Immature Granulocytes: 1 %
Lymphocytes Relative: 14 %
Lymphs Abs: 1.5 10*3/uL (ref 0.7–4.0)
MCH: 31.1 pg (ref 26.0–34.0)
MCHC: 32 g/dL (ref 30.0–36.0)
MCV: 97.1 fL (ref 80.0–100.0)
Monocytes Absolute: 0.7 10*3/uL (ref 0.1–1.0)
Monocytes Relative: 6 %
Neutro Abs: 8.4 10*3/uL — ABNORMAL HIGH (ref 1.7–7.7)
Neutrophils Relative %: 78 %
Platelets: 190 10*3/uL (ref 150–400)
RBC: 4.76 MIL/uL (ref 3.87–5.11)
RDW: 14 % (ref 11.5–15.5)
WBC: 10.8 10*3/uL — ABNORMAL HIGH (ref 4.0–10.5)
nRBC: 0 % (ref 0.0–0.2)

## 2019-01-30 LAB — ETHANOL: Alcohol, Ethyl (B): <10 mg/dL (ref <10)

## 2019-01-30 LAB — SARS CORONAVIRUS 2 BY RT PCR (HOSPITAL ORDER, PERFORMED IN ~~LOC~~ HOSPITAL LAB): SARS Coronavirus 2: NEGATIVE

## 2019-01-30 NOTE — Progress Notes (Signed)
Consult request has been received. CSW attempting to follow up at present time  Toyna Erisman M. Aayansh Codispoti LCSWA Transitions of Care  Clinical Social Worker  Ph: [phone] 

## 2019-01-30 NOTE — Progress Notes (Signed)
CSW awaiting call back from Mora from [place].

## 2019-01-30 NOTE — ED Notes (Signed)
Spoke with Keitha Butte, (family friend/neighbor).  Ms. Aline Brochure reports that pt lives alone and not able to walk.  Pts Niece is [place] and she has not been able to get in contact with her.  EMS Sandrea Hammond) here to re-eval pt for home services.  EMS worker left number for SW to call regarding pts home conditions.

## 2019-01-30 NOTE — TOC Initial Note (Signed)
Transition of Care [facility]) - Initial/Assessment Note    Patient Details  Name: Meagan Holt MRN: [id number] Date of Birth: 23-Jan-1941  Transition of Care [facility]) CM/SW Contact:    Doralyn Kirkes Dimitri Ped, LCSW Phone Number: 01/30/2019, 8:19 PM  Clinical Narrative:   Pt is a 78 year old Caucasian female who is being seen at the ED for generalized weakness.  Prior to coming to the ED, pt reports that she resides at home alone and is visited by a neighbor Kennyth Lose who checks on her often.   Pt reports at the time of assessment that she does not feel safe returning home because she is unable to care for herself. Pt explains that her two emergency contacts are deceased and the other is a resident in a nursing home. Pt declined to give verbal permission to contact her next family member, Passenger transport manager) for information and to provide updates.  Pt has a hx of receiving [facility] Pt has no assistive devices or DME in the home to accommodate her. Pt reports that she uses 2L of 02 at night.   Pt is not receptive to placement at a SNF but is open to receiving treatment from a Home health provider despite her stating that she does not feel safe returning home.   CSW contacted APS social worker at [place] to file report. CSW will continue to follow pt for any discharge needs. EDP updated and aware of pt's statements.   [place] Transitions of Care  Clinical Social Worker  Ph: [phone]   Expected Discharge Plan: [place] Barriers to Discharge: Unsafe home situation   Patient Goals and CMS Choice Patient states their goals for this hospitalization and ongoing recovery are:: Pt is unsure of goals but is receptive to [facility] upon discharge CMS Medicare.gov Compare Post Acute Care list provided to:: Patient Choice offered to / list presented to : Patient  Expected Discharge Plan and Services Expected Discharge Plan: [place] In-house Referral: Clinical Social Work, [place] Acute Care Choice: [place] arrangements for the past 2 months: Apartment                           HH Arranged: Therapist, sports, PT, Social Work, Refused SNF, Nurse's Aide [place] Agency: [employer] (now [facility])     Wellsite geologist spoke with at [place]: Delsa Bern [phone]  Prior Living Arrangements/Services Living arrangements for the past 2 months: Apartment Lives with:: Self Patient language and need for interpreter reviewed:: Yes Do you feel safe going back to the place where you live?: No   Pt states that she feels unsafe returning home considering her current phyiscal state and inability to walk  Need for Family Participation in Patient Care: No (Comment) Care giver support system in place?: No (comment)(Pt states that she is financially willing to hire a private aide)   Criminal Activity/Legal Involvement Pertinent to Current Situation/Hospitalization: No - Comment as needed  Activities of Daily Living      Permission Sought/Granted Permission sought to share information with : Case Manager Permission granted to share information with : Yes, Verbal Permission Granted              Emotional Assessment Appearance:: Appears stated age Attitude/Demeanor/Rapport: Engaged, Gracious Affect (typically observed): Accepting, Appropriate, Calm Orientation: : Oriented to Self, Oriented to  Time, Oriented to  Situation, Oriented to Place   Psych Involvement: No (comment)  Admission diagnosis:  Altered Mental Status Patient Active Problem List   Diagnosis Date Noted  . Orthostatic hypotension 05/22/2017  . Syncope and collapse 05/21/2017  . Chronic pain syndrome 05/21/2017  . Altered mental status 05/20/2017  . GERD (gastroesophageal reflux disease) 05/20/2017  . Depression 05/20/2017  . History of stroke 05/20/2017  . Premature beats 05/20/2017  . Head injury 07/30/2013  . Difficulty  walking 07/30/2013  . COPD (chronic obstructive pulmonary disease) ([place]) 07/30/2013   PCP:  [name], MD Pharmacy:   [place], [place] [address] Phone: [phone] Fax: [phone]  [id number] [place], [place] - [address] [place] [address] Phone: [phone] Fax: [phone]     Social Determinants of Health (SDOH) Interventions    Readmission Risk Interventions No flowsheet data found.

## 2019-01-30 NOTE — Discharge Instructions (Addendum)
Some of your medicines are too strong such as the hydrocodone and gabapentin.  The should be discontinued.  Follow-up closely with your doctor.
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
## 2019-01-30 NOTE — ED Triage Notes (Signed)
Ems says pt has chronic r knee pain that she takes hydrocodone for and takes flexeril for back pain.  cbg 109

## 2019-01-30 NOTE — ED Notes (Signed)
Pt says, "I fell yesterday, I guess, I don't remember a thing."  C/o pain to right knee, rating pain 6/10.  C/o lower back pain, rating 5/10 and radiates to right leg.
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
## 2019-01-30 NOTE — ED Notes (Signed)
Pt assisted with transferring from ED stretcher to hospital bed for comfort and being at risk for skin breakdown. Pt has macular degeneration and very poor vision. Pt is alert and oriented to name, place, and situation. Pt does have moments of memory impairment. Pt also asking this nurse about "being on a train, and a man having her cell phone." Explained to pt that she was in the hospital and the dr wanted her to stay overnight- pt then says, "oh yeah, that's right." When I explain to pt that she has not been on a train, she argues that she has, just explained to pt that she has not since this nurse has been here. Pt also asking about "who is that man and women over there in corner of room". explained to pt that no one was in her room but myself and her. Pt assisted with eating peanut butter graham cracker and ice water. Call bell placed within reach. Informed pt of the time (10:30 at night) and explained to pt she should try to get some rest now. Pt calm and cooperative at this time.

## 2019-01-30 NOTE — ED Triage Notes (Signed)
EMS reports family noticed pt having increased generalized weakness over past few days.  This morning family noticed pt was confused.  EMS says pt was incontinent of urine this morning.  PT c/o pain in back and r knee.

## 2019-01-30 NOTE — ED Notes (Signed)
Meagan Holt, [facility] aware of need for hospital bed as pt will be boarding overnight a/w SW and PT consult.

## 2019-01-31 DIAGNOSIS — M6281 Muscle weakness (generalized): Secondary | ICD-10-CM | POA: Diagnosis not present

## 2019-01-31 LAB — URINALYSIS, MICROSCOPIC (REFLEX): WBC, UA: >50 WBC/hpf (ref 0–5)

## 2019-01-31 LAB — URINALYSIS, ROUTINE W REFLEX MICROSCOPIC
Glucose, UA: NEGATIVE mg/dL
Ketones, ur: 40 mg/dL — AB
Nitrite: NEGATIVE
Protein, ur: 100 mg/dL — AB
Specific Gravity, Urine: >1.03 — ABNORMAL HIGH (ref 1.005–1.030)
pH: 6 (ref 5.0–8.0)

## 2019-01-31 MED ORDER — HYDROGEN PEROXIDE 3 % EX SOLN
CUTANEOUS | Status: AC
  Filled 2019-01-31: qty 473

## 2019-01-31 MED ORDER — QUETIAPINE FUMARATE 25 MG PO TABS
25.0000 mg | ORAL_TABLET | Freq: Once | ORAL | Status: AC
  Administered 2019-01-31: 25 mg via ORAL
  Filled 2019-01-31: qty 1

## 2019-01-31 MED ORDER — ZOLPIDEM TARTRATE 5 MG PO TABS
5.0000 mg | ORAL_TABLET | Freq: Once | ORAL | Status: AC
  Administered 2019-01-31: 5 mg via ORAL
  Filled 2019-01-31: qty 1
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
## 2019-01-31 NOTE — Progress Notes (Signed)
Pt meets inpatient criteria per Marvia Pickles, NP. Referral information has been faxed to the following hospitals for review:  [facility]  [facility]  [facility]   CSW spoke with pt's AP RN involuntary committing pt - RN will talk with provider.  Disposition will continue to assist with inpatient placement needs.   Audree Camel, LCSW, [place] Disposition [place] [facility] [phone]

## 2019-01-31 NOTE — ED Notes (Signed)
Pt lying in bed talking to unseen people in room, kicking off covers, scooting to end of bed with feet over side rails. Pt pulled back up in bed and attempted to reorient to place. Provided patient with something to drink.

## 2019-01-31 NOTE — ED Notes (Signed)
Pt confused stating "I need to go to the hospital" this nurse states "we are in the hospital" pt replies stating "we are not in the hospital we are in a old store, tell me another lie" Meagan Holt charge nurse tells dr about situation and dr is gave order for meds. See mar.

## 2019-01-31 NOTE — BH Assessment (Addendum)
Assessment Note  Meagan Holt is an 78 y.o. female that presents this date with altered mental status. Patient is observed to be anxious as this Probation officer attempts to assess. Patient denies any S/I, H/I or AVH although this writer is uncertain if patient is comprehending the content of this writer's questions. Patient is oriented to place only and appears to be very disorganized. Patient states she was "in some hospital because she fell" although was unable to elaborate on that event. Patient appears to be responding to internal stimuli as evidenced by patient pointing to objects in the room and making unrelated statements that are inaudible. Patient eventually stops responding to this writers questions and is difficult to re-direct. Information to complete assessment were obtained from prior notes and history. Per earlier note this date staff RN notes: "Patient is sitting on bed, when asked how she is doing, patient starts talking about "someone giving her phone back" and "spending her money". Patient redirected, explained that patient was in hospital and she said, "I know, I just want to put my stuff in that locker" (pointing over in the corner of room). Patient also appeared to be having auditory visual hallucinations pointing to people in the room that are not there and having conversations with them. Psychiatric history is limited per chart review. Per note review LCSW is in the process of obtaining collateral information from family members in reference to patient's current psycho-social history. Case was staffed with Money NP who recommended a inpatient admission to assist with stabilization.          Diagnosis: Altered mental status   Past Medical History:  Past Medical History:  Diagnosis Date  . Arthritis    "all my joints" (07/30/2013)  . Chronic back pain   . Chronic bronchitis ([place])    "get it q year" (07/30/2013)  . COPD (chronic obstructive pulmonary disease) ([place])   . Depression   .  Falls frequently    "fell twice in the last 2 days" (07/30/2013)  . GERD (gastroesophageal reflux disease)    "rarely" (07/30/2013)  . H/O hiatal hernia   . History of blood transfusion   . Kidney stones   . Stroke [facility])     Past Surgical History:  Procedure Laterality Date  . APPENDECTOMY    . BACK SURGERY     "put cages in mid back; did arthroscopic OR on lower back" (11/326/2014)  . BILATERAL OOPHORECTOMY Bilateral   . CATARACT EXTRACTION W/ INTRAOCULAR LENS  IMPLANT, BILATERAL Bilateral   . CHOLECYSTECTOMY    . EXCISIONAL HEMORRHOIDECTOMY    . GASTRIC BYPASS    . HERNIA REPAIR     "removed my belly button too" (07/30/2013)  . KIDNEY STONE SURGERY     "twice" (07/30/2013)  . KNEE ARTHROSCOPY Left   . TONSILLECTOMY      Family History:  Family History  Problem Relation Age of Onset  . Stomach cancer Father     Social History:  reports that she has quit smoking. Her smoking use included cigarettes. She has a 126.00 pack-year smoking history. She has never used smokeless tobacco. She reports current alcohol use. She reports that she does not use drugs.  Additional Social History:  Alcohol / Drug Use Pain Medications: See MAR Prescriptions: See MAR Over the Counter: See MAR History of alcohol / drug use?: No history of alcohol / drug abuse Longest period of sobriety (when/how long): NA  CIWA: CIWA-Ar BP: (!) 155/94 Pulse Rate: 86 COWS:  Allergies:  Allergies  Allergen Reactions  . Latex Itching and Swelling  . Doxycycline Rash    Home Medications: (Not in a hospital admission)   OB/GYN Status:  No LMP recorded. Patient is postmenopausal.  General Assessment Data Location of Assessment: [facility] TTS Assessment: In system Is this a Tele or Face-to-Face Assessment?: Tele Assessment Is this an Initial Assessment or a Re-assessment for this encounter?: Initial Assessment Patient Accompanied by:: N/A Language Other than English: No Living Arrangements: Other  (Comment)(Private residence ) What gender do you identify as?: Female Marital status: Widowed [place] name: Kawai Pregnancy Status: No Living Arrangements: Alone Can pt return to current living arrangement?: Yes Admission Status: Voluntary Is patient capable of signing voluntary admission?: Yes Referral Source: Self/Family/Friend Insurance type: Medicare  Medical Screening Exam ([place]) Medical Exam completed: Yes  Crisis Care Plan Living Arrangements: Alone Legal Guardian: (NA) Name of Psychiatrist: None Name of Therapist: None  Education Status Is patient currently in school?: No Is the patient employed, unemployed or receiving disability?: Unemployed  Risk to self with the past 6 months Suicidal Ideation: No Has patient been a risk to self within the past 6 months prior to admission? : No Suicidal Intent: No Has patient had any suicidal intent within the past 6 months prior to admission? : No Is patient at risk for suicide?: No Suicidal Plan?: No Has patient had any suicidal plan within the past 6 months prior to admission? : No Access to Means: No What has been your use of drugs/alcohol within the last 12 months?: NA Previous Attempts/Gestures: No How many times?: 0 Other Self Harm Risks: (NA) Triggers for Past Attempts: (NA) Intentional Self Injurious Behavior: None Family Suicide History: No Recent stressful life event(s): Other (Comment)(Recent fall altered mental status) Persecutory voices/beliefs?: No Depression: No Depression Symptoms: (Pt denies) Substance abuse history and/or treatment for substance abuse?: No Suicide prevention information given to non-admitted patients: Not applicable  Risk to Others within the past 6 months Homicidal Ideation: No Does patient have any lifetime risk of violence toward others beyond the six months prior to admission? : No Thoughts of Harm to Others: No Current Homicidal Intent: No Current Homicidal Plan:  No Access to Homicidal Means: No Identified Victim: NA History of harm to others?: No Assessment of Violence: None Noted Violent Behavior Description: NA Does patient have access to weapons?: No Criminal Charges Pending?: No Does patient have a court date: No Is patient on probation?: No  Psychosis Hallucinations: None noted Delusions: None noted  Mental Status Report Appearance/Hygiene: In hospital gown Eye Contact: Poor Motor Activity: Agitation Speech: Soft, Slow Level of Consciousness: Quiet/awake Mood: Anxious Affect: Irritable Anxiety Level: Moderate Thought Processes: Thought Blocking Judgement: Unable to Assess Orientation: Unable to assess Obsessive Compulsive Thoughts/Behaviors: Unable to Assess  Cognitive Functioning Concentration: Unable to Assess Memory: Unable to Assess Is patient IDD: No Insight: Unable to Assess Impulse Control: Unable to Assess Appetite: (UTA) Have you had any weight changes? : (UTA) Sleep: (UTA) Total Hours of Sleep: (UTA) Vegetative Symptoms: (UTA)  ADLScreening [facility] Assessment Services) Patient's cognitive ability adequate to safely complete daily activities?: No Patient able to express need for assistance with ADLs?: Yes Independently performs ADLs?: No  Prior Inpatient Therapy Prior Inpatient Therapy: No  Prior Outpatient Therapy Prior Outpatient Therapy: No Does patient have an ACCT team?: No Does patient have Intensive In-House Services?  : No Does patient have [facility] services? : No Does patient have P4CC services?: No  ADL Screening (  condition at time of admission) Patient's cognitive ability adequate to safely complete daily activities?: No Is the patient deaf or have difficulty hearing?: Yes Does the patient have difficulty seeing, even when wearing glasses/contacts?: Yes Does the patient have difficulty concentrating, remembering, or making decisions?: Yes Patient able to express need for assistance with ADLs?:  Yes Does the patient have difficulty dressing or bathing?: Yes Independently performs ADLs?: No Communication: Independent Dressing (OT): Independent Grooming: Independent Feeding: Independent Bathing: Needs assistance Is this a change from baseline?: Pre-admission baseline Toileting: Independent In/Out Bed: Needs assistance Is this a change from baseline?: Pre-admission baseline Walks in Home: Independent Does the patient have difficulty walking or climbing stairs?: Yes Weakness of Legs: Both Weakness of Arms/Hands: None  Home Assistive Devices/Equipment Home Assistive Devices/Equipment: None  Therapy Consults (therapy consults require a physician order) PT Evaluation Needed: No OT Evalulation Needed: No SLP Evaluation Needed: No Abuse/Neglect Assessment (Assessment to be complete while patient is alone) Physical Abuse: Denies Verbal Abuse: Denies Sexual Abuse: Denies Exploitation of patient/patient's resources: Denies Self-Neglect: Denies Values / Beliefs Cultural Requests During Hospitalization: None Spiritual Requests During Hospitalization: None Consults Spiritual Care Consult Needed: No Social Work Consult Needed: No Regulatory affairs officer (For Healthcare) Does Patient Have a Medical Advance Directive?: No Would patient like information on creating a medical advance directive?: No - Patient declined Nutrition Screen- [facility] Adult/[facility] Patient's home diet: Regular(pt ate 60% of meal tray)        Disposition: Case was staffed with Money NP who recommended a inpatient admission to assist with stabilization.      Disposition Initial Assessment Completed for this Encounter: Yes Disposition of Patient: (Observe and monitor) Patient refused recommended treatment: No Mode of transportation if patient is discharged/movement?: (Unk)  On Site Evaluation by:   Reviewed with Physician:    Mamie Nick 01/31/2019 4:47 PM

## 2019-01-31 NOTE — Plan of Care (Signed)
  Problem: Acute Rehab PT Goals(only PT should resolve) Goal: Pt Will Go Supine/Side To Sit Outcome: Progressing Flowsheets (Taken 01/31/2019 0923) Pt will go Supine/Side to Sit: with min guard assist Goal: Patient Will Transfer Sit To/From Stand Outcome: Progressing Flowsheets (Taken 01/31/2019 [id number]) Patient will transfer sit to/from stand: with min guard assist Goal: Pt Will Transfer Bed To Chair/Chair To Bed Outcome: Progressing Flowsheets (Taken 01/31/2019 0923) Pt will Transfer Bed to Chair/Chair to Bed: min guard assist Goal: Pt Will Ambulate Outcome: Progressing Flowsheets (Taken 01/31/2019 0923) Pt will Ambulate: 50 feet; with minimal assist; with rolling walker   9:23 AM, 01/31/19 Lonell Grandchild, MPT Physical Therapist with [facility] [phone] office [phone] mobile phone

## 2019-01-31 NOTE — BH Assessment (Signed)
BHH Assessment Progress Note Case was staffed with Money NP who recommended a inpatient admission to assist with stabilization.     

## 2019-01-31 NOTE — ED Provider Notes (Addendum)
6:25 PM-CTS is asking that the patient be involuntarily committed.  Ii is unclear exactly why they want this.  Patient presented yesterday and was evaluated for confusion.  Duration of confusion is unknown.  At this time the patient is alert and cooperative.  She is semi-oriented.  She knows that she is in [facility], and the approximate date.  Does not know what building she is in now.  She correctly states that she lives alone with her dog.  When queried, she states that she has had some visual and auditory hallucinations in the past, but they do not bother her.  She does not currently have any hallucinations.  She does not currently see a psychiatrist or take psychiatric medications.  At this time she is comfortable and expresses no additional needs.  Urinalysis done yesterday is abnormal.  Will repeat, and add urine culture, obtaining specimen by cath.  I will contact TTS, to discuss ongoing management.     Daleen Bo, MD 01/31/19 1844  I contacted TTS, Sarah the social worker who has been working with this case.  I explained that the patient did not have criteria for IVC, at this time.  Judson Roch understands that we are repeating urinalysis as a potential cause for confusion and disorientation.    Daleen Bo, MD 01/31/19 1925   01/02/2019-patient had right knee pain during ambulation trial, imaging obtained.  Patient consistent with degenerative changes, likely causing the pain.  There is no fracture.  Treatment for this condition will be Tylenol 650 mg every 4 hours, and elastic knee sleeve PRN.  She can also use heat if needed for help with the discomfort.  This will not require further treatment in the ED or hospital setting.  Patient remains a placement issue, and will likely be going home assuming adequate support can be obtained.  If that is not possible she will likely need to have a competency evaluation by psychiatry, if we are unable to locate a legal guardian.    Daleen Bo, MD 02/01/19 [phone]

## 2019-01-31 NOTE — Evaluation (Signed)
Physical Therapy Evaluation Patient Details Name: Meagan Holt MRN: [id number] DOB: 25-Dec-1940 Today's Date: 01/31/2019   History of Present Illness  Meagan Holt. Meagan Holt is a 78 year old female with some confusion.  Patient does acknowledge this.  She states that today she thought she was in the hospital although she was at home.  She seemed to be aware of this after the fact though.  She is not quite sure why she may be confused.  She does feel mildly generally weak.  She has chronic pain, but denies any acute change recently.  No fevers.  No dyspnea.  No urinary complaints although EMS reports that she was apparently incontinent of urine this morning.    Clinical Impression  Patient presents slightly confused with frequent hallucinations, but able to cooperate with therapy appropriately.  Patient demonstrates slow labored movement for sitting up at bedside, limited mostly due to c/o low back pain, has difficulty for sit to stands due to BLE weakness, gait limited due to poor standing balance and had near loss of balance when making turns with RW and at high risk for falls.  Patient put back to bed with nursing staff present in room after therapy.  Patient will benefit from continued physical therapy in hospital and recommended venue below to increase strength, balance, endurance for safe ADLs and gait.    Follow Up Recommendations SNF    Equipment Recommendations  None recommended by PT    Recommendations for Other Services       Precautions / Restrictions Precautions Precautions: Fall Restrictions Weight Bearing Restrictions: No      Mobility  Bed Mobility Overal bed mobility: Needs Assistance Bed Mobility: Supine to Sit;Sit to Supine     Supine to sit: Min assist;Mod assist Sit to supine: Min assist   General bed mobility comments: increased time, labored movement  Transfers Overall transfer level: Needs assistance Equipment used: Rolling walker (2 wheeled) Transfers: Sit  to/from [facility] Sit to Stand: Min assist Stand pivot transfers: Min assist;Mod assist       General transfer comment: slow labored unsteady movement  Ambulation/Gait Ambulation/Gait assistance: Mod assist Gait Distance (Feet): 22 Feet Assistive device: Rolling walker (2 wheeled) Gait Pattern/deviations: Decreased step length - right;Decreased step length - left;Decreased stride length;Staggering left;Staggering right Gait velocity: decreased   General Gait Details: unsteady slow labored cadence with most difficulty making turns with near loss of balance, limited secondary to fatigue  Stairs            Wheelchair Mobility    Modified Rankin (Stroke Patients Only)       Balance Overall balance assessment: Needs assistance Sitting-balance support: No upper extremity supported;Feet supported Sitting balance-Leahy Scale: Fair     Standing balance support: Bilateral upper extremity supported;During functional activity Standing balance-Leahy Scale: Fair Standing balance comment: using RW                             Pertinent Vitals/Pain Pain Assessment: Faces Faces Pain Scale: Hurts little more Pain Location: low back Pain Descriptors / Indicators: Aching;Sore Pain Intervention(s): Limited activity within patient's tolerance;Monitored during session    Aline expects to be discharged to:: Private residence Living Arrangements: Alone Available Help at Discharge: Neighbor Type of Home: Apartment Home Access: Stairs to enter Entrance Stairs-Rails: None Entrance Stairs-Number of Steps: 2 Home Layout: One level   Additional Comments: info per patient who is slightly confused    Prior  Function Level of Independence: Independent with assistive device(s)         Comments: household ambulator with occasional use of RW, uses SPC when out side, "per patient"     Hand Dominance        Extremity/Trunk  Assessment   Upper Extremity Assessment Upper Extremity Assessment: Generalized weakness    Lower Extremity Assessment Lower Extremity Assessment: Generalized weakness    Cervical / Trunk Assessment Cervical / Trunk Assessment: Normal  Communication   Communication: No difficulties  Cognition Arousal/Alertness: Awake/alert Behavior During Therapy: WFL for tasks assessed/performed Overall Cognitive Status: Impaired/Different from baseline Area of Impairment: Orientation;Awareness                 Orientation Level: Person             General Comments: Patient able to follow directions consistently with verbal/tactile cueing, has frequent hallucinations      General Comments      Exercises     Assessment/Plan    PT Assessment Patient needs continued PT services  PT Problem List Decreased strength;Decreased activity tolerance;Decreased balance;Decreased mobility       PT Treatment Interventions DME instruction;Gait training;Stair training;Functional mobility training;Therapeutic activities;Therapeutic exercise;Patient/family education    PT Goals (Current goals can be found in the Care Plan section)  Acute Rehab PT Goals Patient Stated Goal: return home PT Goal Formulation: With patient Time For Goal Achievement: 02/14/19 Potential to Achieve Goals: Good    Frequency Min 3X/week   Barriers to discharge        Co-evaluation               AM-PAC PT "6 Clicks" Mobility  Outcome Measure Help needed turning from your back to your side while in a flat bed without using bedrails?: A Lot Help needed moving from lying on your back to sitting on the side of a flat bed without using bedrails?: A Lot Help needed moving to and from a bed to a chair (including a wheelchair)?: A Lot Help needed standing up from a chair using your arms (e.g., wheelchair or bedside chair)?: A Lot Help needed to walk in hospital room?: A Lot Help needed climbing 3-5 steps with  a railing? : A Lot 6 Click Score: 12    End of Session Equipment Utilized During Treatment: Gait belt Activity Tolerance: Patient tolerated treatment well;Patient limited by fatigue Patient left: in bed;with call bell/phone within reach;with nursing/sitter in room Nurse Communication: Mobility status PT Visit Diagnosis: Unsteadiness on feet (R26.81);Other abnormalities of gait and mobility (R26.89);Muscle weakness (generalized) (M62.81)    Time: 6644-0347 PT Time Calculation (min) (ACUTE ONLY): 27 min   Charges:   PT Evaluation $PT Eval Moderate Complexity: 1 Mod PT Treatments $Therapeutic Activity: 23-37 mins        9:21 AM, 01/31/19 Lonell Grandchild, MPT Physical Therapist with [facility] [phone] office [phone] mobile phone

## 2019-01-31 NOTE — ED Notes (Signed)
Pt sitting on bed, when asked how she is doing, pt starts talking about "someone giving her phone back" and "spending her money". Pt redirected, explained that pt was in hospital and she said, "I know, I just want to put my stuff in that locker" (pointing over in the corner of room).  This nurse asked pt if she was having trouble sleeping and pt responded yes, says that she takes Ambien at home when she can't sleep. This medication is on her med list and Dr Betsey Holiday gave verbal order to administer.

## 2019-01-31 NOTE — ED Notes (Signed)
Telepsych in process 

## 2019-01-31 NOTE — Progress Notes (Signed)
CSW received call from [facility] TTS staff to discuss care for pt. CSW was informed that referrals for [facility] had been sent out for placement by TTS.   CSW available for any further assistance.   [place] [facility]  Clinical Social Worker  Ph: [phone]

## 2019-01-31 NOTE — Progress Notes (Signed)
CSW in to see pt to assess for orientation and to revisit conversation regarding placement recommendation. CSW was able to obtain verbal permission to speak with her niece who pt reports is also her POA.   CSW observes that pt is not oriented to situation,place, or person. CSW notices that pt is more confused that last night.   CSW will attempt to reach family at this time.  [place] [facility]  Clinical Social Worker  Ph: [phone]

## 2019-02-01 ENCOUNTER — Emergency Department (HOSPITAL_COMMUNITY): Payer: Medicare Other

## 2019-02-01 DIAGNOSIS — R531 Weakness: Secondary | ICD-10-CM | POA: Diagnosis not present

## 2019-02-01 DIAGNOSIS — M6281 Muscle weakness (generalized): Secondary | ICD-10-CM | POA: Diagnosis not present

## 2019-02-01 DIAGNOSIS — R4182 Altered mental status, unspecified: Secondary | ICD-10-CM | POA: Diagnosis not present

## 2019-02-01 MED ORDER — ADULT MULTIVITAMIN W/MINERALS CH
1.0000 | ORAL_TABLET | Freq: Every day | ORAL | Status: DC
  Administered 2019-02-01 – 2019-02-04 (×3): 1 via ORAL
  Filled 2019-02-01 (×3): qty 1

## 2019-02-01 MED ORDER — ALBUTEROL SULFATE HFA 108 (90 BASE) MCG/ACT IN AERS
1.0000 | INHALATION_SPRAY | Freq: Four times a day (QID) | RESPIRATORY_TRACT | Status: DC | PRN
  Administered 2019-02-01: 2 via RESPIRATORY_TRACT
  Filled 2019-02-01: qty 6.7

## 2019-02-01 MED ORDER — FLUTICASONE FUROATE-VILANTEROL 200-25 MCG/INH IN AEPB
1.0000 | INHALATION_SPRAY | Freq: Every day | RESPIRATORY_TRACT | Status: DC
  Administered 2019-02-01 – 2019-02-04 (×4): 1 via RESPIRATORY_TRACT
  Filled 2019-02-01: qty 28

## 2019-02-01 MED ORDER — ACETAMINOPHEN 325 MG PO TABS
650.0000 mg | ORAL_TABLET | ORAL | Status: DC | PRN
  Administered 2019-02-01 – 2019-02-03 (×2): 650 mg via ORAL
  Filled 2019-02-01 (×2): qty 2

## 2019-02-01 MED ORDER — ASPIRIN EC 325 MG PO TBEC
325.0000 mg | DELAYED_RELEASE_TABLET | Freq: Every morning | ORAL | Status: DC
  Administered 2019-02-01 – 2019-02-04 (×3): 325 mg via ORAL
  Filled 2019-02-01 (×3): qty 1

## 2019-02-01 MED ORDER — BUPROPION HCL ER (XL) 300 MG PO TB24
300.0000 mg | ORAL_TABLET | Freq: Every day | ORAL | Status: DC
  Administered 2019-02-02 – 2019-02-03 (×2): 300 mg via ORAL
  Filled 2019-02-01 (×4): qty 1

## 2019-02-01 MED ORDER — FUROSEMIDE 40 MG PO TABS: 20.0000 mg | ORAL_TABLET | Freq: Every day | ORAL | Status: DC | PRN

## 2019-02-01 MED ORDER — FLUOXETINE HCL 10 MG PO CAPS
10.0000 mg | ORAL_CAPSULE | Freq: Every morning | ORAL | Status: DC
  Administered 2019-02-01 – 2019-02-04 (×3): 10 mg via ORAL
  Filled 2019-02-01 (×5): qty 1

## 2019-02-01 NOTE — ED Notes (Signed)
Attempted to get pt OOB to see if pt can stand and ambulate. Unable to get OOB due to pain to right knee. Noted to be swollen and tender to touch. Pt has hx of frequent falls and chronic pain. EDP notified  Pt to be xrayed

## 2019-02-01 NOTE — ED Notes (Signed)
Sitter reports pt drank coffee for breakfast.

## 2019-02-01 NOTE — Progress Notes (Signed)
CSW spoke with patients niece, Creed Copper, [facility]: [phone], regarding disposition plans. CSW informed niece that patient has been cleared psychiatrically and is stable to return home. Niece voiced no concerns with her returning home at this time and stated she has always lived by herself. Patient has a neighbor, Mortimer Fries [phone], that helps take care of her while at home. Per niece, patient receives meals-on-wheels and [place] visits with her once a day/ brings her any additional food she needs. CSW attempted to reach [place] but was only able to leave a message. Will continue to follow up  Kingsley Spittle, [place]  [phone]

## 2019-02-01 NOTE — ED Notes (Signed)
[place]is at [facility] to reassess.

## 2019-02-01 NOTE — BHH Counselor (Signed)
  REASSESSMENT  [facility] reassessed pt for safety and stability.  Pt observed laying in the bed and picking at the sheets between her legs.  Pt was able to give her date of birth but could not give today's date.  Pt said "it almost June 1st."  Pt spoke sarcastic to Probation officer "why you keep yelling?" When [facility] apologized and spoke softer, then asked if that was better?  Pt responded: "Is it better for you?"  [facility] asked pt 'Do you want to hurt yourself'.  Pt said "no but I want to hurt you."  [facility] asked pt 'Do you hear voices?'  Pt responded "I hear yours."  [facility] asked pt 'Do you have any questions for me?'  Pt responded "Ain't no need of asking, you not going to answer them.  All you going to do is lie.   You said you was going to take me to the hospital but you didn't.  You don't even come see me.  You was suppose to come see me the other day and you didn't come."  Pt continues inpatient criteria due to confusion.  TTS will continue to look for inpatient placement.  Dorethy Tomey L. [place], [place], [facility], [facility] Therapeutic Triage Specialist  [phone]

## 2019-02-01 NOTE — Consult Note (Signed)
Telepsych Consultation   Reason for Consult:  Altered mental state Referring Physician:  EDP Location of Patient:  Location of Provider: [place] Department  Patient Identification: Meagan Holt MRN:  [id number] Principal Diagnosis: Altered mental status Diagnosis:  Principal Problem:   Altered mental status   Total Time spent with patient: 30 minutes  Subjective:   Meagan Holt is a 78 y.o. female patient answers most questions with no or is "not nearly of dizziness."  Patient does state that she has no thoughts of wanting to kill herself and when asked if she has any homicidal ideations she states "not at this time."  Patient was asked about hallucinations and patient responded back with "are you nuts."  Patient refuses to give direct answers to most of the questions with the patient is giving me the middle finger numerous times as her answer to the questions.  HPI: Patient is an 78 year old Caucasian female who presented to the ED by EMS due to reported weakness.  Note indicates that the patient was brought in by EMS after family had contacted them.  However, we have been able to contact any family members in regards to this patient's baseline or to the events that led up to contacting the EMS.  Social work has been involved and have requested that the reach out to family.  The patient had previously declined contacting any patients however the patient has been unable to answer questions and appears confused and is very agitated when being asked questions.  Patient appears to indirectly answer questions to avoid recognition of confusion or dementia.  Feel that patient does not meet capacity to make decisions on her own and it was reported in a note that she has a POA and they should be contacting the POA for collateral information.  Phone numbers for the patient's family members listed in the chart did not reach anyone.  Looked up patient's address listed as she lives at  [facility] of [place] and the phone number there is [phone].  I contacted patient's nurse, Shirlean Mylar, and requested that the staff contact the apartment complex to see if they have an emergency contact on file.  I have contacted Dr. Thurnell Garbe and discussed this patient at length.  Still waiting urine culture on this patient.  However, due to the patient denying any suicidal or homicidal ideations, whether directly or indirectly, and denying hallucinations, she does not meet inpatient criteria. She does not seem safe to be discharged home alone. This was discussed with Dr. Thurnell Garbe and social work will be further involved with future treatment of patient. An APS report was filed, but APS did not feel the patient needed to be followed by them (see notes).   Past Psychiatric History: Unknown at this time  Risk to Self: Suicidal Ideation: No Suicidal Intent: No Is patient at risk for suicide?: No Suicidal Plan?: No Access to Means: No What has been your use of drugs/alcohol within the last 12 months?: NA How many times?: 0 Other Self Harm Risks: (NA) Triggers for Past Attempts: (NA) Intentional Self Injurious Behavior: None Risk to Others: Homicidal Ideation: No Thoughts of Harm to Others: No Current Homicidal Intent: No Current Homicidal Plan: No Access to Homicidal Means: No Identified Victim: NA History of harm to others?: No Assessment of Violence: None Noted Violent Behavior Description: NA Does patient have access to weapons?: No Criminal Charges Pending?: No Does patient have a court date: No Prior Inpatient Therapy: Prior Inpatient Therapy:  No Prior Outpatient Therapy: Prior Outpatient Therapy: No Does patient have an ACCT team?: No Does patient have Intensive In-House Services?  : No Does patient have [facility] services? : No Does patient have P4CC services?: No  Past Medical History:  Past Medical History:  Diagnosis Date  . Arthritis    "all my joints"  (07/30/2013)  . Chronic back pain   . Chronic bronchitis ([place])    "get it q year" (07/30/2013)  . COPD (chronic obstructive pulmonary disease) ([place])   . Depression   . Falls frequently    "fell twice in the last 2 days" (07/30/2013)  . GERD (gastroesophageal reflux disease)    "rarely" (07/30/2013)  . H/O hiatal hernia   . History of blood transfusion   . Kidney stones   . Stroke [facility])     Past Surgical History:  Procedure Laterality Date  . APPENDECTOMY    . BACK SURGERY     "put cages in mid back; did arthroscopic OR on lower back" (11/326/2014)  . BILATERAL OOPHORECTOMY Bilateral   . CATARACT EXTRACTION W/ INTRAOCULAR LENS  IMPLANT, BILATERAL Bilateral   . CHOLECYSTECTOMY    . EXCISIONAL HEMORRHOIDECTOMY    . GASTRIC BYPASS    . HERNIA REPAIR     "removed my belly button too" (07/30/2013)  . KIDNEY STONE SURGERY     "twice" (07/30/2013)  . KNEE ARTHROSCOPY Left   . TONSILLECTOMY     Family History:  Family History  Problem Relation Age of Onset  . Stomach cancer Father    Family Psychiatric  History: unknown Social History:  Social History   Substance and Sexual Activity  Alcohol Use Yes   Comment: rarely      Social History   Substance and Sexual Activity  Drug Use No    Social History   Socioeconomic History  . Marital status: Widowed    Spouse name: Not on file  . Number of children: Not on file  . Years of education: Not on file  . Highest education level: Not on file  Occupational History  . Not on file  Social Needs  . Financial resource strain: Not on file  . Food insecurity:    Worry: Not on file    Inability: Not on file  . Transportation needs:    Medical: Not on file    Non-medical: Not on file  Tobacco Use  . Smoking status: Former Smoker    Packs/day: 3.00    Years: 42.00    Pack years: 126.00    Types: Cigarettes  . Smokeless tobacco: Never Used  Substance and Sexual Activity  . Alcohol use: Yes    Comment: rarely   . Drug  use: No  . Sexual activity: Yes  Lifestyle  . Physical activity:    Days per week: Not on file    Minutes per session: Not on file  . Stress: Not on file  Relationships  . Social connections:    Talks on phone: Not on file    Gets together: Not on file    Attends religious service: Not on file    Active member of club or organization: Not on file    Attends meetings of clubs or organizations: Not on file    Relationship status: Not on file  Other Topics Concern  . Not on file  Social History Narrative  . Not on file   Additional Social History:    Allergies:   Allergies  Allergen Reactions  .  Latex Itching and Swelling  . Doxycycline Rash    Labs:  Results for orders placed or performed during the hospital encounter of 01/30/19 (from the past 48 hour(s))  Rapid urine drug screen (hospital performed)     Status: Abnormal   Collection Time: 01/30/19  1:09 PM  Result Value Ref Range   Opiates POSITIVE (A) NONE DETECTED   Cocaine NONE DETECTED NONE DETECTED   Benzodiazepines NONE DETECTED NONE DETECTED   Amphetamines NONE DETECTED NONE DETECTED   Tetrahydrocannabinol NONE DETECTED NONE DETECTED   Barbiturates NONE DETECTED NONE DETECTED    Comment: (NOTE) DRUG SCREEN FOR MEDICAL PURPOSES ONLY.  IF CONFIRMATION IS NEEDED FOR ANY PURPOSE, NOTIFY LAB WITHIN 5 DAYS. LOWEST DETECTABLE LIMITS FOR URINE DRUG SCREEN Drug Class                     Cutoff (ng/mL) Amphetamine and metabolites    1000 Barbiturate and metabolites    200 Benzodiazepine                 643 Tricyclics and metabolites     300 Opiates and metabolites        300 Cocaine and metabolites        300 THC                            50 Performed at [facility], [address], [place], [place] [address]   Urinalysis, Routine w reflex microscopic     Status: Abnormal   Collection Time: 01/30/19  1:09 PM  Result Value Ref Range   Color, Urine YELLOW YELLOW   APPearance CLEAR CLEAR   Specific Gravity,  Urine 1.024 1.005 - 1.030   pH 6.0 5.0 - 8.0   Glucose, UA NEGATIVE NEGATIVE mg/dL   Hgb urine dipstick SMALL (A) NEGATIVE   Bilirubin Urine NEGATIVE NEGATIVE   Ketones, ur 20 (A) NEGATIVE mg/dL   Protein, ur 30 (A) NEGATIVE mg/dL   Nitrite NEGATIVE NEGATIVE   Leukocytes,Ua LARGE (A) NEGATIVE   RBC / HPF 6-10 0 - 5 RBC/hpf   WBC, UA 6-10 0 - 5 WBC/hpf   Bacteria, UA NONE SEEN NONE SEEN   Squamous Epithelial / LPF 0-5 0 - 5    Comment: Performed at [facility], [address], [place],  [address]  SARS Coronavirus 2 (CEPHEID - Performed in [place] hospital lab), Hosp Order     Status: None   Collection Time: 01/30/19  1:10 PM  Result Value Ref Range   SARS Coronavirus 2 NEGATIVE NEGATIVE    Comment: (NOTE) If result is NEGATIVE SARS-CoV-2 target nucleic acids are NOT DETECTED. The SARS-CoV-2 RNA is generally detectable in upper and lower  respiratory specimens during the acute phase of infection. The lowest  concentration of SARS-CoV-2 viral copies this assay can detect is 250  copies / mL. A negative result does not preclude SARS-CoV-2 infection  and should not be used as the sole basis for treatment or other  patient management decisions.  A negative result may occur with  improper specimen collection / handling, submission of specimen other  than nasopharyngeal swab, presence of viral mutation(s) within the  areas targeted by this assay, and inadequate number of viral copies  (<250 copies / mL). A negative result must be combined with clinical  observations, patient history, and epidemiological information. If result is POSITIVE SARS-CoV-2 target nucleic acids are DETECTED. The SARS-CoV-2 RNA  is generally detectable in upper and lower  respiratory specimens dur ing the acute phase of infection.  Positive  results are indicative of active infection with SARS-CoV-2.  Clinical  correlation with patient history and other diagnostic information is  necessary to  determine patient infection status.  Positive results do  not rule out bacterial infection or co-infection with other viruses. If result is PRESUMPTIVE POSTIVE SARS-CoV-2 nucleic acids MAY BE PRESENT.   A presumptive positive result was obtained on the submitted specimen  and confirmed on repeat testing.  While 2019 novel coronavirus  (SARS-CoV-2) nucleic acids may be present in the submitted sample  additional confirmatory testing may be necessary for epidemiological  and / or clinical management purposes  to differentiate between  SARS-CoV-2 and other Sarbecovirus currently known to infect humans.  If clinically indicated additional testing with an alternate test  methodology [phone]) is advised. The SARS-CoV-2 RNA is generally  detectable in upper and lower respiratory sp ecimens during the acute  phase of infection. The expected result is Negative. Fact Sheet for Patients:  StrictlyIdeas.no Fact Sheet for Healthcare Providers: BankingDealers.co.za This test is not yet approved or cleared by the [place] FDA and has been authorized for detection and/or diagnosis of SARS-CoV-2 by FDA under an Emergency Use Authorization (EUA).  This EUA will remain in effect (meaning this test can be used) for the duration of the COVID-19 declaration under Section 564(b)(1) of the Act, 21 U.S.C. section 360bbb-3(b)(1), unless the authorization is terminated or revoked sooner. Performed at [facility], [address], [place], [place] [address]   CBC with Differential     Status: Abnormal   Collection Time: 01/30/19  1:39 PM  Result Value Ref Range   WBC 10.8 (H) 4.0 - 10.5 K/uL   RBC 4.76 3.87 - 5.11 MIL/uL   Hemoglobin 14.8 12.0 - 15.0 g/dL   HCT 46.2 (H) 36.0 - 46.0 %   MCV 97.1 80.0 - 100.0 fL   MCH 31.1 26.0 - 34.0 pg   MCHC 32.0 30.0 - 36.0 g/dL   RDW 14.0 11.5 - 15.5 %   Platelets 190 150 - 400 K/uL   nRBC 0.0 0.0 - 0.2 %    Neutrophils Relative % 78 %   Neutro Abs 8.4 (H) 1.7 - 7.7 K/uL   Lymphocytes Relative 14 %   Lymphs Abs 1.5 0.7 - 4.0 K/uL   Monocytes Relative 6 %   Monocytes Absolute 0.7 0.1 - 1.0 K/uL   Eosinophils Relative 0 %   Eosinophils Absolute 0.0 0.0 - 0.5 K/uL   Basophils Relative 1 %   Basophils Absolute 0.1 0.0 - 0.1 K/uL   Immature Granulocytes 1 %   Abs Immature Granulocytes 0.05 0.00 - 0.07 K/uL    Comment: Performed at [facility], [address] [place], [place] [address]  Comprehensive metabolic panel     Status: Abnormal   Collection Time: 01/30/19  1:39 PM  Result Value Ref Range   Sodium 137 135 - 145 mmol/L   Potassium 3.5 3.5 - 5.1 mmol/L   Chloride 105 98 - 111 mmol/L   CO2 22 22 - 32 mmol/L   Glucose, Bld 100 (H) 70 - 99 mg/dL   BUN 17 8 - 23 mg/dL   Creatinine, Ser 0.67 0.44 - 1.00 mg/dL   Calcium 8.8 (L) 8.9 - 10.3 mg/dL   Total Protein 6.8 6.5 - 8.1 g/dL   Albumin 3.6 3.5 - 5.0 g/dL   AST 19 15 -  41 U/L   ALT 15 0 - 44 U/L   Alkaline Phosphatase 83 38 - 126 U/L   Total Bilirubin 0.8 0.3 - 1.2 mg/dL   GFR calc non Af Amer >60 >60 mL/min   GFR calc Af Amer >60 >60 mL/min   Anion gap 10 5 - 15    Comment: Performed at [facility], [address] [place], [place] [address]  Ethanol     Status: None   Collection Time: 01/30/19  1:39 PM  Result Value Ref Range   Alcohol, Ethyl (B) <10 <10 mg/dL    Comment: (NOTE) Lowest detectable limit for serum alcohol is 10 mg/dL. For medical purposes only. Performed at [facility], [address] [place], [place] [address]   Urinalysis, Routine w reflex microscopic     Status: Abnormal   Collection Time: 01/31/19  7:49 PM  Result Value Ref Range   Color, Urine YELLOW YELLOW   APPearance CLEAR CLEAR   Specific Gravity, Urine >1.030 (H) 1.005 - 1.030   pH 6.0 5.0 - 8.0   Glucose, UA NEGATIVE NEGATIVE mg/dL   Hgb urine dipstick MODERATE (A) NEGATIVE   Bilirubin Urine SMALL (A) NEGATIVE   Ketones, ur 40 (A) NEGATIVE  mg/dL   Protein, ur 100 (A) NEGATIVE mg/dL   Nitrite NEGATIVE NEGATIVE   Leukocytes,Ua SMALL (A) NEGATIVE    Comment: Performed at [facility], [address], [place], [place] [address]  Urinalysis, Microscopic (reflex)     Status: Abnormal   Collection Time: 01/31/19  7:49 PM  Result Value Ref Range   RBC / HPF 0-5 0 - 5 RBC/hpf   WBC, UA >50 0 - 5 WBC/hpf   Bacteria, UA FEW (A) NONE SEEN   Squamous Epithelial / LPF 0-5 0 - 5    Comment: Performed at [facility], [address] [place], [place] [address]    Medications:  Current Facility-Administered Medications  Medication Dose Route Frequency Provider Last Rate Last Dose  . albuterol (VENTOLIN HFA) 108 (90 Base) MCG/ACT inhaler 1-2 puff  1-2 puff Inhalation Q6H PRN Francine Graven, DO      . aspirin EC tablet 325 mg  325 mg Oral q morning - 10a Francine Graven, DO      . buPROPion (WELLBUTRIN XL) 24 hr tablet 300 mg  300 mg Oral QHS Francine Graven, DO      . FLUoxetine (PROZAC) capsule 10 mg  10 mg Oral q morning - 10a Francine Graven, DO      . fluticasone furoate-vilanterol (BREO ELLIPTA) 200-25 MCG/INH 1 puff  1 puff Inhalation Daily Francine Graven, DO      . furosemide (LASIX) tablet 20 mg  20 mg Oral Daily PRN Francine Graven, DO      . multivitamin with minerals tablet 1 tablet  1 tablet Oral Daily Francine Graven, DO       Current Outpatient Medications  Medication Sig Dispense Refill  . ADVAIR DISKUS 250-50 MCG/DOSE AEPB Inhale 1 puff into the lungs 2 (two) times daily.    Marland Kitchen buPROPion (WELLBUTRIN XL) 300 MG 24 hr tablet Take 300 mg by mouth at bedtime.     . cyclobenzaprine (FLEXERIL) 10 MG tablet Take 1 tablet by mouth 3 (three) times daily.    Marland Kitchen FLUoxetine (PROZAC) 10 MG capsule Take 1 capsule by mouth every morning.     Marland Kitchen HYDROcodone-acetaminophen (NORCO) 10-325 MG per tablet Take 1 tablet by mouth every 6 (six) hours as needed for moderate pain.    Marland Kitchen  ibuprofen (ADVIL,MOTRIN) 600 MG tablet Take 1  tablet by mouth 3 (three) times daily as needed for mild pain or moderate pain.     Marland Kitchen zolpidem (AMBIEN) 10 MG tablet Take 10 mg by mouth at bedtime as needed for sleep.     Marland Kitchen albuterol (PROVENTIL HFA;VENTOLIN HFA) 108 (90 BASE) MCG/ACT inhaler Inhale 1-2 puffs into the lungs every 6 (six) hours as needed for wheezing or shortness of breath.    Marland Kitchen aspirin EC 325 MG tablet Take 325 mg by mouth every morning.    . furosemide (LASIX) 20 MG tablet Take 20 mg by mouth daily as needed for fluid or edema.    . gabapentin (NEURONTIN) 300 MG capsule Take 900 mg by mouth 2 (two) times daily.     . Multiple Vitamin (MULTIVITAMIN WITH MINERALS) TABS tablet Take 1 tablet by mouth daily.    . Multiple Vitamins-Minerals (ICAPS PO) Take 1 tablet by mouth daily.    . Omega-3 Fatty Acids (FISH OIL) 1000 MG CAPS Take 3 capsules by mouth daily.    Vladimir Faster Glycol-Propyl Glycol (SYSTANE) 0.4-0.3 % SOLN Apply 1-2 drops to eye daily as needed (for dry eye relief).      Musculoskeletal: Strength & Muscle Tone: decreased Gait & Station: patient remained in bed during interview and only moved arms Patient leans: N/A  Psychiatric Specialty Exam: Physical Exam  Nursing note and vitals reviewed. Constitutional: She appears well-developed and well-nourished.  Cardiovascular: Normal rate.  Respiratory: Effort normal.  Neurological: She is alert.  Skin: Skin is warm.    Review of Systems  Constitutional: Negative.   HENT: Negative.   Eyes: Negative.   Respiratory: Negative.   Cardiovascular: Negative.   Gastrointestinal: Negative.   Genitourinary: Negative.   Musculoskeletal: Negative.   Skin: Negative.   Neurological: Negative.        Confusion  Endo/Heme/Allergies: Negative.   Psychiatric/Behavioral:       Irritability    Blood pressure 130/88, pulse 83, temperature 98.8 F (37.1 C), temperature source Oral, resp. rate 16, height 5\' 7"  (1.702 m), weight 80.7 kg, SpO2 93 %.Body mass index is 27.88  kg/m.  General Appearance: Guarded  Eye Contact:  Poor  Speech:  Clear and Coherent when she decides to answer  Volume:  Decreased  Mood:  Angry and Irritable  Affect:  Congruent  Thought Process:  Linear and Descriptions of Associations: Circumstantial  Orientation:  Other:  refused to answer orientation questions with direct answers. usually answered with "None of your damn buisness."  Thought Content:  Illogical  Suicidal Thoughts:  No  Homicidal Thoughts:  No  Memory:  Immediate;   Poor Recent;   Poor Remote;   Poor  Judgement:  Impaired  Insight:  Lacking  Psychomotor Activity:  Decreased  Concentration:  Concentration: Fair  Recall:  Poor  Fund of Knowledge:  Poor  Language:  Fair  Akathisia:  No  Handed:  Refused to answer  AIMS (if indicated):     Assets:  Catering manager Housing Resilience  ADL's:  Impaired  Cognition:  Impaired,  Moderate  Sleep:        Treatment Plan Summary: Daily contact with patient to assess and evaluate symptoms and progress in treatment, Medication management and Social work to assist with collateral and placement  Disposition: No evidence of imminent risk to self or others at present.   Patient does not meet criteria for psychiatric inpatient admission.  This service was provided via telemedicine using a 2-way,  interactive audio and Radiographer, therapeutic.  Names of all persons participating in this telemedicine service and their role in this encounter. Name: Jannett [address] Role: Patient  Name: Marvia Pickles NP Role: Provider  Name:  Role:   Name:  Role:     Lewis Shock, FNP 02/01/2019 9:53 AM

## 2019-02-01 NOTE — ED Notes (Signed)
Never received return call for second consult to social work.

## 2019-02-01 NOTE — ED Notes (Signed)
Spoke with [facility] and pt has been cleared psychiatrically at this time. Denies SI, HI, no hallucinations. No emergency contact numbers work. Called complex where pt lives ([address] [facility] [facility] in [place] [phone] request a possible emergency contact number. Attempted to call SW at [facility] left voicemail as this is now turned over to SW when able to speak with them. Per [facility] if pt shows signs of hallucinations or SI/ HI ideations make [facility] aware. Darnelle Maffucci spoke with DR Thurnell Garbe

## 2019-02-01 NOTE — ED Notes (Signed)
Fluids being pushed at this time

## 2019-02-01 NOTE — ED Notes (Signed)
Called and spoke case/management to review chart. They will call me back

## 2019-02-01 NOTE — ED Notes (Signed)
DR Eulis Foster is requesting a competency assessment .messaged SW in [place] Pt does not have a healthcare power of attorney

## 2019-02-01 NOTE — ED Notes (Addendum)
TTS to reeval at this time. Pt repositioned in bed. Disoriented to, place and time

## 2019-02-01 NOTE — ED Notes (Signed)
Call placed back to social work regarding pt safety at home as pt is unable to stand, transfer, or ambulate without significant assistance which is also indicated by PT evaluation.  Pt's safety is at significant risk if discharged.

## 2019-02-01 NOTE — ED Notes (Signed)
Spoke with [facility] to ask for plan for pt. [facility] provider will review chart and call me back

## 2019-02-01 NOTE — ED Notes (Signed)
attempt to call sw x1

## 2019-02-02 DIAGNOSIS — M6281 Muscle weakness (generalized): Secondary | ICD-10-CM | POA: Diagnosis not present

## 2019-02-02 LAB — URINE CULTURE: Culture: <10000 — AB

## 2019-02-02 NOTE — ED Notes (Signed)
Pt states that she is willing to go to rehab for a little while but the patient is only oriented to herself is all that she is oriented to

## 2019-02-02 NOTE — ED Notes (Signed)
Sitter Discontinued

## 2019-02-02 NOTE — ED Notes (Signed)
c-rn aware of need for sw contact

## 2019-02-02 NOTE — Progress Notes (Signed)
CSW received request from RN, Colin Rhein, for competency evaluation. CSW spoke with patients RN, Gerald Stabs, regarding request and concerns for discharge home. CSW informed RN that EDP or TTS would need to complete capacity evaluation if determined it was needed. EDP is able to do this or TTS/ psych can be placed. However, if patient is requesting to return home we can not force patient to go to a SNF facility at this time. Requested RN ask patient if she she would be open to SNF placement once she wakes up.   Will continue to follow.   Kingsley Spittle, LCSW [facility]  [phone]

## 2019-02-03 DIAGNOSIS — M6281 Muscle weakness (generalized): Secondary | ICD-10-CM | POA: Diagnosis not present

## 2019-02-03 NOTE — ED Notes (Signed)
Meagan Holt(Case Management) in the Dept to see the Pt.-

## 2019-02-03 NOTE — ED Notes (Signed)
Barrier cream applied to red areas on back and left and right flanks.

## 2019-02-03 NOTE — ED Notes (Signed)
Pt back to bed with 2 person assist. Pt unsteady and anxious on her feet.

## 2019-02-03 NOTE — ED Notes (Signed)
Spoke with pt's primary care provider.  States pt has significantly declined over the past year and she saw her recently.  She was also concerned about pt's status, but pt was talking about having a caregiver full time which is not the case.  She is in agreement the pt lacks the ability to care for herself alone and is concerned for her wellbeing and safety. APS called to open case.

## 2019-02-03 NOTE — ED Notes (Signed)
Pt repositioned to head of bed, draw sheet reentered  under pt.

## 2019-02-03 NOTE — ED Notes (Signed)
Patient assist to sitting position on the side of the bed. Patient did not want to sit in the recliner at this time.

## 2019-02-03 NOTE — Progress Notes (Addendum)
Physical Therapy Treatment Patient Details Name: Meagan Holt MRN: [id number] DOB: [birth year] Today's Date: 02/03/2019    History of Present Illness Meagan Holt is a 78 year old female with some confusion.  Patient does acknowledge this.  She states that today she thought she was in the hospital although she was at home.  She seemed to be aware of this after the fact though.  She is not quite sure why she may be confused.  She does feel mildly generally weak.  She has chronic pain, but denies any acute change recently.  No fevers.  No dyspnea.  No urinary complaints although EMS reports that she was apparently incontinent of urine this morning.    PT Comments    Patient presents less confused, agreeable for and cooperative with therapy.  Patient required much time for sitting up at bedside due to right knee and low back pain, demonstrated slow labored movement and limited to a few steps at bedside.  Patient required 4-5 minute rest break after transferring to chair before attempting to walk again, but unable to walk out of room due to fatigue and generalized weakness.  Patient tolerated sitting up in chair after therapy - nursing staff notified.  Patient will benefit from continued physical therapy in hospital and recommended venue below to increase strength, balance, endurance for safe ADLs and gait.   Follow Up Recommendations  SNF     Equipment Recommendations  None recommended by PT    Recommendations for Other Services       Precautions / Restrictions Precautions Precautions: Fall Restrictions Weight Bearing Restrictions: No    Mobility  Bed Mobility Overal bed mobility: Needs Assistance Bed Mobility: Supine to Sit     Supine to sit: Mod assist     General bed mobility comments: increased time, labored movement  Transfers Overall transfer level: Needs assistance Equipment used: Rolling walker (2 wheeled) Transfers: Sit to/from [facility] Sit to  Stand: Min assist;Mod assist Stand pivot transfers: Mod assist       General transfer comment: slow labored movement  Ambulation/Gait Ambulation/Gait assistance: Mod assist Gait Distance (Feet): 4 Feet Assistive device: Rolling walker (2 wheeled) Gait Pattern/deviations: Decreased step length - right;Decreased step length - left;Decreased stride length Gait velocity: slow   General Gait Details: limited to 4-5 slow unsteady steps at bedside mostly due to c/o right knee discomfort/pain and fatigue   Stairs             Wheelchair Mobility    Modified Rankin (Stroke Patients Only)       Balance Overall balance assessment: Needs assistance Sitting-balance support: Feet supported;No upper extremity supported Sitting balance-Leahy Scale: Fair Sitting balance - Comments: seated at bedside   Standing balance support: Bilateral upper extremity supported;During functional activity Standing balance-Leahy Scale: Poor Standing balance comment: fair/poor using RW                            Cognition Arousal/Alertness: Awake/alert Behavior During Therapy: WFL for tasks assessed/performed Overall Cognitive Status: Within Functional Limits for tasks assessed                                 General Comments: Patient able to follow directions consistently       Exercises  seated bilateral ankle pumps, LAQ's, and marching in place x 10 repetitions each    General Comments  Pertinent Vitals/Pain Pain Assessment: 0-10 Pain Score: 6  Pain Location: low back and right knee Pain Descriptors / Indicators: Aching;Sore Pain Intervention(s): Limited activity within patient's tolerance;Monitored during session    Home Living                      Prior Function            PT Goals (current goals can now be found in the care plan section) Acute Rehab PT Goals PT Goal Formulation: With patient Time For Goal Achievement:  02/14/19 Potential to Achieve Goals: Good Progress towards PT goals: Progressing toward goals    Frequency    Min 3X/week      PT Plan  Current plan remains appropriate    Co-evaluation              AM-PAC PT "6 Clicks" Mobility   Outcome Measure  Help needed turning from your back to your side while in a flat bed without using bedrails?: A Lot Help needed moving from lying on your back to sitting on the side of a flat bed without using bedrails?: A Lot Help needed moving to and from a bed to a chair (including a wheelchair)?: A Lot Help needed standing up from a chair using your arms (e.g., wheelchair or bedside chair)?: A Lot Help needed to walk in hospital room?: A Lot Help needed climbing 3-5 steps with a railing? : A Lot 6 Click Score: 12    End of Session Equipment Utilized During Treatment: Gait belt Activity Tolerance: Patient tolerated treatment well;Patient limited by fatigue;Patient limited by pain Patient left: in chair;with call bell/phone within reach Nurse Communication: Mobility status PT Visit Diagnosis: Unsteadiness on feet (R26.81);Other abnormalities of gait and mobility (R26.89);Muscle weakness (generalized) (M62.81)     Time: 3220-2542 PT Time Calculation (min) (ACUTE ONLY): 32 min  Charges:  $Therapeutic Exercise: 8-22 mins $Therapeutic Activity: 8-22 mins                     10:13 AM, 02/03/19 Lonell Grandchild, MPT Physical Therapist with [facility] 336 [phone] office [phone] mobile phone

## 2019-02-03 NOTE — Progress Notes (Signed)
CSW in to visit pt at bedside to discuss upcoming discharge plans. Pt reports that as of now she is not receptive to SNF placement. Pt goes into detail and states that she would like to have a [facility] agency come in and assist with rehab.   CSW engaged in conversation with pt about what to expect while being treated by [facility] and asked Pt how she would manage in a home setting. Pt states that she is aware that [facility] is not a replacement for a Personal Aide.   CSW will contact family and neighbor representatives to possibly assist with discharge plans.   CSW awaiting call back from APS and Niece, [name] Rosendo Gros.   [place] Transitions of Care  Clinical Social Worker  Ph: [phone]

## 2019-02-03 NOTE — ED Notes (Signed)
PT at bedside doing evaluation.

## 2019-02-04 DIAGNOSIS — M6281 Muscle weakness (generalized): Secondary | ICD-10-CM | POA: Diagnosis not present

## 2019-02-04 NOTE — Care Management (Signed)
Patient Information   Patient Name Elene, Downum ([id number]) Sex Female DOB Jul 04, 1941  Room Bed  APA06 APA06  Patient Demographics   Address [address] [place] [address] Phone [phone] [facility]) [phone] (Mobile)  Patient Ethnicity & Race   Ethnic Group Patient Race  Not Hispanic or Latino White or Caucasian  Emergency Contact(s)   Name Relation Home Work [place], [place] Niece   [phone]  Documents on File    Status Date Received Description  Documents for the Patient  EMR Patient Summary Not Received    Historic Radiology Documentation Not Received    [place] Received 11/07/16   [place] E-Signature HIPAA Notice of Privacy Not Received    [place] E-Signature HIPAA Notice of Privacy Spanish Not Received    Driver's License Not Received    Insurance Card Not Received    Advance Directives/Living Will/HCPOA/POA Not Received    Editor, commissioning Not Received    Insurance Card Received 03/05/12 315  [place] Received 03/05/12 315  Insurance Card Not Received    [place] Not Received    AMB HH/NH/Hospice  08/18/13 12/14 poc Adv HC  Advanced Beneficiary Notice (ABN) Not Received    E-Signature AOB Spanish Not Received    Other Photo ID Not Received    [place] E-Signature HIPAA Notice of Privacy Signed 08/08/18   Insurance Card Received 12/25/16 Medicare & Tricare  AMB Outside Hospital Record Received 01/07/18 [place]  Patient Photo   Photo of Patient  Documents for the Encounter  AOB (Assignment of Insurance Benefits) Received 01/30/19 AOB unable to obtain signature/consent due to condition  E-signature AOB     MEDICARE RIGHTS Received 01/30/19 Medicare rights unable to obtain signature/consent due to condition  E-signature Medicare Rights     Discharge Attachment   Weakness  (English)  After Visit Summary   ED After Visit Summary  EMS Run Sheet Received 01/30/19   ED Patient Billing Extract   ED PB Billing Extract  EKG Received 01/31/19   Admission Information   Current Information   Attending Provider Admitting Provider Admission Type Admission Status  Default, Provider, MD  Emergency Admission (Confirmed)       Admission Date/Time Discharge Date Hospital Service Auth/Cert Status  35/32/99 12:38 PM  Emergency Medicine [place] [facility] Room/Bed   [place] DEPT APA06/APA06        Admission   Complaint  Altered Mental Status  Hospital Account   Name Acct ID Class Status Primary Coverage  Derionna, Salvador [id number] Emergency Open MEDICARE - MEDICARE PART A AND B      Guarantor Account (for [place] [id number])   Name Relation to Pt Service Area Active? Acct Type  Sharmon Leyden Self [facility] Yes Personal/Family  Address Phone    [address] [place] [address] [phone])        Coverage Information (for Hospital Account [id number])   1. [place] PART A AND B   F/O Payor/Plan Precert #  MEDICARE/MEDICARE PART A AND B   Subscriber Subscriber #  Landa, Mullinax [id number]  Address Phone  [address] [place], [place] [address]   2. TRICARE/TRICARE FOR LIFE   F/O Payor/Plan Precert #  TRICARE/TRICARE FOR LIFE   Chief Operating Officer #  Arneta, Mahmood [id number]  Address Phone  [place] [address] [phone]       Care Everywhere ID:  [id number]

## 2019-02-04 NOTE — ED Provider Notes (Signed)
1:44 PM-I was asked by social work, Meagan Holt, who has been seeing the patient yesterday and today, to evaluate the patient.  Meagan Holt feels that the patient is competent.  When I rounded on the patient at 7 AM she was still asleep, and difficult to arouse.  Since then she has eaten both breakfast and lunch, and is now alert and conversant.  She is much brighter than when I saw her on 01/31/2019.  At that time she was confused about her location and her situation.  Today she is alert and oriented x3.  She is aware of her current situation.  She admits to right knee pain which is worse with ambulation.  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  She was able to name her orthopedist.  She asserts that she wants to go home.  I offered her a knee immobilizer for the right knee and she accepts it to try for help with ambulation.  Assessment-patient is much more alert, is now oriented, and now appears to have the capacity to make the decision to leave and go home.  I do not have competency concerns at this time.  Urine culture done, 4 days ago did not show a urinary tract infection.  Vital signs were normal, this morning.  Therefore, assuming an apparent safe discharge plan, I believe that she can be discharged.   Daleen Bo, MD 02/04/19 1350

## 2019-02-04 NOTE — Progress Notes (Signed)
Physical Therapy Treatment Patient Details Name: Meagan Holt MRN: [id number] DOB: [birth year] Today's Date: 02/04/2019    History of Present Illness Meagan Esqueda. Holt is a 78 year old female with some confusion.  Patient does acknowledge this.  She states that today she thought she was in the hospital although she was at home.  She seemed to be aware of this after the fact though.  She is not quite sure why she may be confused.  She does feel mildly generally weak.  She has chronic pain, but denies any acute change recently.  No fevers.  No dyspnea.  No urinary complaints although EMS reports that she was apparently incontinent of urine this morning.    PT Comments    Patient demonstrates slight improvement for using BUE for sitting up at bedside, requires assistance to help move RLE due to right knee pain (patient wearing soft knee sleeve on right knee), limited for taking steps at bedside due to c/o fatigue and right knee pain when weightbearing, able to take a few side steps using RW to transfer to chair with NT observing.  Patient tolerated sitting up in chair after therapy and NT encouraged to use RW when assisting patient back to bed.  Patient will benefit from continued physical therapy in hospital and recommended venue below to increase strength, balance, endurance for safe ADLs and gait.   Follow Up Recommendations  SNF     Equipment Recommendations  None recommended by PT    Recommendations for Other Services       Precautions / Restrictions Precautions Precautions: Fall Required Braces or Orthoses: Other Brace Other Brace: knee sleeve right knee Restrictions Weight Bearing Restrictions: No    Mobility  Bed Mobility Overal bed mobility: Needs Assistance Bed Mobility: Supine to Sit     Supine to sit: Min assist;Mod assist     General bed mobility comments: increased time, labored movement  Transfers Overall transfer level: Needs assistance Equipment used: Rolling  walker (2 wheeled) Transfers: Sit to/from [facility] Sit to Stand: Min assist Stand pivot transfers: Mod assist       General transfer comment: slow labored movement with limited weightbearing on RLE due to knee pain  Ambulation/Gait Ambulation/Gait assistance: Mod assist Gait Distance (Feet): 4 Feet Assistive device: Rolling walker (2 wheeled) Gait Pattern/deviations: Decreased step length - right;Decreased step length - left;Decreased stride length Gait velocity: slow   General Gait Details: limited to 4-5 slow unsteady steps at bedside mostly due to c/o right knee discomfort/pain and fatigue   Stairs             Wheelchair Mobility    Modified Rankin (Stroke Patients Only)       Balance Overall balance assessment: Needs assistance Sitting-balance support: Feet supported;No upper extremity supported Sitting balance-Leahy Scale: Fair Sitting balance - Comments: seated at bedside   Standing balance support: Bilateral upper extremity supported;During functional activity Standing balance-Leahy Scale: Poor Standing balance comment: fair/poor using RW                            Cognition Arousal/Alertness: Awake/alert Behavior During Therapy: WFL for tasks assessed/performed Overall Cognitive Status: Within Functional Limits for tasks assessed                                 General Comments: Patient able to follow directions consistently  Exercises General Exercises - Lower Extremity Long Arc Quad: AROM;Strengthening;Both;10 reps;Seated Hip Flexion/Marching: AROM;Strengthening;Both;10 reps;Seated Toe Raises: AROM;Strengthening;Both;10 reps;Seated Heel Raises: AROM;Strengthening;Both;10 reps;Seated    General Comments        Pertinent Vitals/Pain Pain Assessment: Faces Faces Pain Scale: Hurts little more Pain Location: right knee, low back Pain Descriptors / Indicators: Aching;Sore Pain Intervention(s):  Limited activity within patient's tolerance;Monitored during session    Home Living                      Prior Function            PT Goals (current goals can now be found in the care plan section) Acute Rehab PT Goals Patient Stated Goal: return home PT Goal Formulation: With patient Time For Goal Achievement: 02/14/19 Potential to Achieve Goals: Good Progress towards PT goals: Progressing toward goals    Frequency    Min 3X/week      PT Plan  Continue with current treatment plan    Co-evaluation              AM-PAC PT "6 Clicks" Mobility   Outcome Measure  Help needed turning from your back to your side while in a flat bed without using bedrails?: A Lot Help needed moving from lying on your back to sitting on the side of a flat bed without using bedrails?: A Lot Help needed moving to and from a bed to a chair (including a wheelchair)?: A Lot Help needed standing up from a chair using your arms (e.g., wheelchair or bedside chair)?: A Lot Help needed to walk in hospital room?: A Lot Help needed climbing 3-5 steps with a railing? : Total 6 Click Score: 11    End of Session   Activity Tolerance: Patient tolerated treatment well;Patient limited by fatigue;Patient limited by pain Patient left: in chair;with call bell/phone within reach Nurse Communication: Mobility status PT Visit Diagnosis: Unsteadiness on feet (R26.81);Other abnormalities of gait and mobility (R26.89);Muscle weakness (generalized) (M62.81)     Time: 7416-3845 PT Time Calculation (min) (ACUTE ONLY): 26 min  Charges:  $Therapeutic Exercise: 8-22 mins $Therapeutic Activity: 8-22 mins                     4:01 PM, 02/04/19 Lonell Grandchild, MPT Physical Therapist with [facility] [phone] office [phone] mobile phone

## 2019-02-04 NOTE — Progress Notes (Signed)
CSW in contact with Pt's neighbor and source of support Kennyth Lose Ph: [phone]. CSW updated jackie on pt's status and disposition. CSW went into detail and informed Kennyth Lose that [facility] services would begin on tomorrow 02/05/19 and that pt would be beginning a more convenient and safer way to administer her medication herself.   Kennyth Lose was in agreement to assist pt with transportation home along with picking up the Rolator walker from [employer].   EDP is aware.

## 2019-02-04 NOTE — TOC Progression Note (Addendum)
Transition of Care [facility]) - Progression Note    Patient Details  Name: Meagan Holt MRN: [id number] Date of Birth: 23-Jan-1941  Transition of Care [facility]) CM/SW Contact  Roda Shutters Margretta Sidle, RN Phone Number: 02/04/2019, 2:34 PM  Clinical Narrative:  Pt continues to refuse placement. Pt wants to get out of bed. Per pt she fixed out her medications herself by touch. We discussed bubble packing medications and she Is excited about this. Pt requests rollator and BSC. She is requesting something for pain. Has been ordered tylenol but takes vicoden at home. CM asked pt about ability to feed herself at home but unable to do so here. Pt states at home she receives MOW, she knows where everything is on the plate and is in her usual setting. Here she doesn't know what the food is or where its at. bedside RN very concerned about pt's ability to ambulate and feed herself at home d/t her macular degeneration. CM advocated that the patient has been living this way for a long time and the inability to see is not a reason for someone to not live alone. CM called niece who has no plans to come visit any times soon. Niece points out that she does not have POA. Niece seems to care for her aunt but makes it clear she is not that involved and does not have the capacity to become very involved. Niece says she is going to call the patient and tell her to just get up. She asks about knee brace.   CM discussed with MD who has been in to assess pt again today. MD agrees that pt is capable of making her own decisions.   CM has requested benefits check through pt's Tricare to determine coverage of PCS services. CM has routed referral for 3 in 1 and rollator to [employer] CM called [facility] about bubble packing medications. Per [facility] pt only has flexeril and vicoden filled regularly.    PT will work with pt this afternoon if pt's mobility is any better plan for DC home with [facility] referral for nursing, pt, aid, csw.  If pt does have coverage for pcs process may be initiated and follow up by [facility].   Oncoming TOC staff to follow.      Expected Discharge Plan: [place] Barriers to Discharge: Unsafe home situation  Expected Discharge Plan and Services Expected Discharge Plan: [place] In-house Referral: Clinical Social Work, [facility]   Post Acute Care Choice: Durable Medical Equipment, Home Health Living arrangements for the past 2 months: Apartment                 DME Arranged: 3-N-1, Walker rolling with seat DME Agency: [employer] Date DME Agency Contacted: 02/04/19 Time DME Agency Contacted: [phone]   [place], PT, Refused SNF, Social Work [employer] Agency: [employer] ([place])     Representative spoke with at [place]: Tim Justis [phone]

## 2019-02-04 NOTE — ED Provider Notes (Signed)
Social work has plan for patient discharge with friends helping out and home health coming tomorrow. Will d/c sedating meds as that seems to be a cuplrit and while not on them here she has appeared to be back to normal. Will d/c when ride is here.    Sherwood Gambler, MD 02/04/19 1622

## 2019-03-25 ENCOUNTER — Other Ambulatory Visit: Payer: Self-pay

## 2019-03-25 ENCOUNTER — Ambulatory Visit (INDEPENDENT_AMBULATORY_CARE_PROVIDER_SITE_OTHER): Payer: Medicare Other

## 2019-03-25 ENCOUNTER — Ambulatory Visit (INDEPENDENT_AMBULATORY_CARE_PROVIDER_SITE_OTHER): Payer: Medicare Other | Admitting: Orthopaedic Surgery

## 2019-03-25 ENCOUNTER — Encounter: Payer: Self-pay | Admitting: Orthopaedic Surgery

## 2019-03-25 VITALS — BP 145/82 | HR 42 | Temp 96.4°F | Ht 67.0 in | Wt 180.0 lb

## 2019-03-25 DIAGNOSIS — G8929 Other chronic pain: Secondary | ICD-10-CM

## 2019-03-25 DIAGNOSIS — M25561 Pain in right knee: Secondary | ICD-10-CM | POA: Diagnosis not present

## 2019-03-25 DIAGNOSIS — F17210 Nicotine dependence, cigarettes, uncomplicated: Secondary | ICD-10-CM | POA: Diagnosis not present

## 2019-03-25 NOTE — Progress Notes (Signed)
Subjective:    Patient ID: Meagan Holt, female    DOB: [birth year], 78 y.o.   MRN: [id number]  HPI  She has chronic pain of the right knee that has been getting worse over the last few months.  She had MRI in 2011 in [place] that showed lateral meniscus tear and DJD.  Her DJD has gotten worse. She did not have surgery then. She has marked popping and giving way of the knee now, swelling but no new trauma. She uses a walker. She is on chronic pain medicine.    Review of Systems  Constitutional: Positive for activity change.  Respiratory: Positive for shortness of breath. Negative for cough and wheezing.   Musculoskeletal: Positive for arthralgias, back pain, gait problem and joint swelling.  All other systems reviewed and are negative.  For Review of Systems, all other systems reviewed and are negative.  The following is a summary of the past history medically, past history surgically, known current medicines, social history and family history.  This information is gathered electronically by the computer from prior information and documentation.  I review this each visit and have found including this information at this point in the chart is beneficial and informative.   Past Medical History:  Diagnosis Date  . Arthritis    "all my joints" (07/30/2013)  . Chronic back pain   . Chronic bronchitis ([place])    "get it q year" (07/30/2013)  . COPD (chronic obstructive pulmonary disease) ([place])   . Depression   . Falls frequently    "fell twice in the last 2 days" (07/30/2013)  . GERD (gastroesophageal reflux disease)    "rarely" (07/30/2013)  . H/O hiatal hernia   . History of blood transfusion   . Kidney stones   . Stroke [facility])     Past Surgical History:  Procedure Laterality Date  . APPENDECTOMY    . BACK SURGERY     "put cages in mid back; did arthroscopic OR on lower back" (11/326/2014)  . BILATERAL OOPHORECTOMY Bilateral   . CATARACT EXTRACTION W/ INTRAOCULAR LENS   IMPLANT, BILATERAL Bilateral   . CHOLECYSTECTOMY    . EXCISIONAL HEMORRHOIDECTOMY    . GASTRIC BYPASS    . HERNIA REPAIR     "removed my belly button too" (07/30/2013)  . KIDNEY STONE SURGERY     "twice" (07/30/2013)  . KNEE ARTHROSCOPY Left   . TONSILLECTOMY      Current Outpatient Medications on File Prior to Visit  Medication Sig Dispense Refill  . ADVAIR DISKUS 250-50 MCG/DOSE AEPB Inhale 1 puff into the lungs 2 (two) times daily.    Marland Kitchen albuterol (PROVENTIL HFA;VENTOLIN HFA) 108 (90 BASE) MCG/ACT inhaler Inhale 1-2 puffs into the lungs every 6 (six) hours as needed for wheezing or shortness of breath.    Marland Kitchen aspirin EC 325 MG tablet Take 325 mg by mouth every morning.    . furosemide (LASIX) 20 MG tablet Take 20 mg by mouth daily as needed for fluid or edema.    Marland Kitchen HYSINGLA ER 20 MG T24A     . ibuprofen (ADVIL,MOTRIN) 600 MG tablet Take 1 tablet by mouth 3 (three) times daily as needed for mild pain or moderate pain.     . Multiple Vitamin (MULTIVITAMIN WITH MINERALS) TABS tablet Take 1 tablet by mouth daily.    . Multiple Vitamins-Minerals (ICAPS PO) Take 1 tablet by mouth daily.    . Omega-3 Fatty Acids (FISH OIL) 1000 MG CAPS Take 3  capsules by mouth daily.    Vladimir Faster Glycol-Propyl Glycol (SYSTANE) 0.4-0.3 % SOLN Apply 1-2 drops to eye daily as needed (for dry eye relief).    Marland Kitchen zolpidem (AMBIEN) 5 MG tablet Take by mouth.    Marland Kitchen buPROPion (WELLBUTRIN XL) 300 MG 24 hr tablet Take 300 mg by mouth at bedtime.     Marland Kitchen FLUoxetine (PROZAC) 10 MG capsule Take 1 capsule by mouth every morning.      No current facility-administered medications on file prior to visit.     Social History   Socioeconomic History  . Marital status: Widowed    Spouse name: Not on file  . Number of children: Not on file  . Years of education: Not on file  . Highest education level: Not on file  Occupational History  . Not on file  Social Needs  . Financial resource strain: Not on file  . Food  insecurity    Worry: Not on file    Inability: Not on file  . Transportation needs    Medical: Not on file    Non-medical: Not on file  Tobacco Use  . Smoking status: Former Smoker    Packs/day: 3.00    Years: 42.00    Pack years: 126.00    Types: Cigarettes  . Smokeless tobacco: Never Used  Substance and Sexual Activity  . Alcohol use: Yes    Comment: rarely   . Drug use: No  . Sexual activity: Yes  Lifestyle  . Physical activity    Days per week: Not on file    Minutes per session: Not on file  . Stress: Not on file  Relationships  . Social Herbalist on phone: Not on file    Gets together: Not on file    Attends religious service: Not on file    Active member of club or organization: Not on file    Attends meetings of clubs or organizations: Not on file    Relationship status: Not on file  . Intimate partner violence    Fear of current or ex partner: Not on file    Emotionally abused: Not on file    Physically abused: Not on file    Forced sexual activity: Not on file  Other Topics Concern  . Not on file  Social History Narrative  . Not on file    Family History  Problem Relation Age of Onset  . Stomach cancer Father     BP (!) 145/82   Pulse (!) 42   Temp (!) 96.4 F (35.8 C)   Ht 5\' 7"  (1.702 m)   Wt 180 lb (81.6 kg)   BMI 28.19 kg/m   Body mass index is 28.19 kg/m.     Objective:   Physical Exam Vitals signs reviewed.  Constitutional:      Appearance: She is well-developed.  HENT:     Head: Normocephalic and atraumatic.  Eyes:     Conjunctiva/sclera: Conjunctivae normal.     Pupils: Pupils are equal, round, and reactive to light.  Neck:     Musculoskeletal: Normal range of motion and neck supple.  Cardiovascular:     Rate and Rhythm: Normal rate and regular rhythm.  Pulmonary:     Effort: Pulmonary effort is normal.  Abdominal:     Palpations: Abdomen is soft.  Musculoskeletal:     Right knee: She exhibits decreased range  of motion, swelling and deformity. Tenderness found. Lateral joint line tenderness noted.  Legs:  Skin:    General: Skin is warm and dry.  Neurological:     Mental Status: She is alert and oriented to person, place, and time.     Cranial Nerves: No cranial nerve deficit.     Motor: No abnormal muscle tone.     Coordination: Coordination normal.     Deep Tendon Reflexes: Reflexes are normal and symmetric. Reflexes normal.  Psychiatric:        Behavior: Behavior normal.        Thought Content: Thought content normal.        Judgment: Judgment normal.      X-rays were done of the right knee, reported separately. Severe DJD     Assessment & Plan:   Encounter Diagnoses  Name Primary?  . Chronic pain of right knee Yes  . Nicotine dependence, cigarettes, uncomplicated    I have explained her knee is "worn out" and she has knock-knee deformity. She is candidate for total knee but she does not want to consider secondary to COVID-19.  I will try injection and see if she gets some relief.  PROCEDURE NOTE:  The patient requests injections of the right knee , verbal consent was obtained.  The right knee was prepped appropriately after time out was performed.   Sterile technique was observed and injection of 1 cc of Depo-Medrol 40 mg with several cc's of plain xylocaine. Anesthesia was provided by ethyl chloride and a 20-gauge needle was used to inject the knee area. The injection was tolerated well.  A band aid dressing was applied.  The patient was advised to apply ice later today and tomorrow to the injection sight as needed.  Return in  Three weeks.  Call if any problem.  Precautions discussed.   Electronically Signed Sanjuana Kava, MD 7/21/20209:49 AM

## 2019-03-25 NOTE — Patient Instructions (Signed)
Steps to Quit Smoking Smoking tobacco is the leading cause of preventable death. It can affect almost every organ in the body. Smoking puts you and people around you at risk for many serious, long-lasting (chronic) diseases. Quitting smoking can be hard, but it is one of the best things that you can do for your health. It is never too late to quit. How do I get ready to quit? When you decide to quit smoking, make a plan to help you succeed. Before you quit:  Pick a date to quit. Set a date within the next 2 weeks to give you time to prepare.  Write down the reasons why you are quitting. Keep this list in places where you will see it often.  Tell your family, friends, and co-workers that you are quitting. Their support is important.  Talk with your doctor about the choices that may help you quit.  Find out if your health insurance will pay for these treatments.  Know the people, places, things, and activities that make you want to smoke (triggers). Avoid them. What first steps can I take to quit smoking?  Throw away all cigarettes at home, at work, and in your car.  Throw away the things that you use when you smoke, such as ashtrays and lighters.  Clean your car. Make sure to empty the ashtray.  Clean your home, including curtains and carpets. What can I do to help me quit smoking? Talk with your doctor about taking medicines and seeing a counselor at the same time. You are more likely to succeed when you do both.  If you are pregnant or breastfeeding, talk with your doctor about counseling or other ways to quit smoking. Do not take medicine to help you quit smoking unless your doctor tells you to do so. To quit smoking: Quit right away  Quit smoking totally, instead of slowly cutting back on how much you smoke over a period of time.  Go to counseling. You are more likely to quit if you go to counseling sessions regularly. Take medicine You may take medicines to help you quit. Some  medicines need a prescription, and some you can buy over-the-counter. Some medicines may contain a drug called nicotine to replace the nicotine in cigarettes. Medicines may:  Help you to stop having the desire to smoke (cravings).  Help to stop the problems that come when you stop smoking (withdrawal symptoms). Your doctor may ask you to use:  Nicotine patches, gum, or lozenges.  Nicotine inhalers or sprays.  Non-nicotine medicine that is taken by mouth. Find resources Find resources and other ways to help you quit smoking and remain smoke-free after you quit. These resources are most helpful when you use them often. They include:  Online chats with a counselor.  Phone quitlines.  Printed self-help materials.  Support groups or group counseling.  Text messaging programs.  Mobile phone apps. Use apps on your mobile phone or tablet that can help you stick to your quit plan. There are many free apps for mobile phones and tablets as well as websites. Examples include Quit Guide from the CDC and smokefree.gov  What things can I do to make it easier to quit?   Talk to your family and friends. Ask them to support and encourage you.  Call a phone quitline (1-800-QUIT-NOW), reach out to support groups, or work with a counselor.  Ask people who smoke to not smoke around you.  Avoid places that make you want to smoke,   such as: ? Bars. ? Parties. ? Smoke-break areas at work.  Spend time with people who do not smoke.  Lower the stress in your life. Stress can make you want to smoke. Try these things to help your stress: ? Getting regular exercise. ? Doing deep-breathing exercises. ? Doing yoga. ? Meditating. ? Doing a body scan. To do this, close your eyes, focus on one area of your body at a time from head to toe. Notice which parts of your body are tense. Try to relax the muscles in those areas. How will I feel when I quit smoking? Day 1 to 3 weeks Within the first 24 hours,  you may start to have some problems that come from quitting tobacco. These problems are very bad 2-3 days after you quit, but they do not often last for more than 2-3 weeks. You may get these symptoms:  Mood swings.  Feeling restless, nervous, angry, or annoyed.  Trouble concentrating.  Dizziness.  Strong desire for high-sugar foods and nicotine.  Weight gain.  Trouble pooping (constipation).  Feeling like you may vomit (nausea).  Coughing or a sore throat.  Changes in how the medicines that you take for other issues work in your body.  Depression.  Trouble sleeping (insomnia). Week 3 and afterward After the first 2-3 weeks of quitting, you may start to notice more positive results, such as:  Better sense of smell and taste.  Less coughing and sore throat.  Slower heart rate.  Lower blood pressure.  Clearer skin.  Better breathing.  Fewer sick days. Quitting smoking can be hard. Do not give up if you fail the first time. Some people need to try a few times before they succeed. Do your best to stick to your quit plan, and talk with your doctor if you have any questions or concerns. Summary  Smoking tobacco is the leading cause of preventable death. Quitting smoking can be hard, but it is one of the best things that you can do for your health.  When you decide to quit smoking, make a plan to help you succeed.  Quit smoking right away, not slowly over a period of time.  When you start quitting, seek help from your doctor, family, or friends. This information is not intended to replace advice given to you by your health care provider. Make sure you discuss any questions you have with your health care provider. Document Released: 06/17/2009 Document Revised: 11/08/2018 Document Reviewed: 11/09/2018 Elsevier Patient Education  2020 [employer]  

## 2019-04-15 ENCOUNTER — Ambulatory Visit (INDEPENDENT_AMBULATORY_CARE_PROVIDER_SITE_OTHER): Payer: Medicare Other | Admitting: Orthopaedic Surgery

## 2019-04-15 ENCOUNTER — Other Ambulatory Visit: Payer: Self-pay

## 2019-04-15 ENCOUNTER — Encounter: Payer: Self-pay | Admitting: Orthopaedic Surgery

## 2019-04-15 VITALS — Temp 96.6°F | Wt 182.0 lb

## 2019-04-15 DIAGNOSIS — G8929 Other chronic pain: Secondary | ICD-10-CM

## 2019-04-15 DIAGNOSIS — F17210 Nicotine dependence, cigarettes, uncomplicated: Secondary | ICD-10-CM

## 2019-04-15 DIAGNOSIS — M25561 Pain in right knee: Secondary | ICD-10-CM

## 2019-04-15 NOTE — Patient Instructions (Signed)
Steps to Quit Smoking Smoking tobacco is the leading cause of preventable death. It can affect almost every organ in the body. Smoking puts you and people around you at risk for many serious, long-lasting (chronic) diseases. Quitting smoking can be hard, but it is one of the best things that you can do for your health. It is never too late to quit. How do I get ready to quit? When you decide to quit smoking, make a plan to help you succeed. Before you quit:  Pick a date to quit. Set a date within the next 2 weeks to give you time to prepare.  Write down the reasons why you are quitting. Keep this list in places where you will see it often.  Tell your family, friends, and co-workers that you are quitting. Their support is important.  Talk with your doctor about the choices that may help you quit.  Find out if your health insurance will pay for these treatments.  Know the people, places, things, and activities that make you want to smoke (triggers). Avoid them. What first steps can I take to quit smoking?  Throw away all cigarettes at home, at work, and in your car.  Throw away the things that you use when you smoke, such as ashtrays and lighters.  Clean your car. Make sure to empty the ashtray.  Clean your home, including curtains and carpets. What can I do to help me quit smoking? Talk with your doctor about taking medicines and seeing a counselor at the same time. You are more likely to succeed when you do both.  If you are pregnant or breastfeeding, talk with your doctor about counseling or other ways to quit smoking. Do not take medicine to help you quit smoking unless your doctor tells you to do so. To quit smoking: Quit right away  Quit smoking totally, instead of slowly cutting back on how much you smoke over a period of time.  Go to counseling. You are more likely to quit if you go to counseling sessions regularly. Take medicine You may take medicines to help you quit. Some  medicines need a prescription, and some you can buy over-the-counter. Some medicines may contain a drug called nicotine to replace the nicotine in cigarettes. Medicines may:  Help you to stop having the desire to smoke (cravings).  Help to stop the problems that come when you stop smoking (withdrawal symptoms). Your doctor may ask you to use:  Nicotine patches, gum, or lozenges.  Nicotine inhalers or sprays.  Non-nicotine medicine that is taken by mouth. Find resources Find resources and other ways to help you quit smoking and remain smoke-free after you quit. These resources are most helpful when you use them often. They include:  Online chats with a counselor.  Phone quitlines.  Printed self-help materials.  Support groups or group counseling.  Text messaging programs.  Mobile phone apps. Use apps on your mobile phone or tablet that can help you stick to your quit plan. There are many free apps for mobile phones and tablets as well as websites. Examples include Quit Guide from the CDC and smokefree.gov  What things can I do to make it easier to quit?   Talk to your family and friends. Ask them to support and encourage you.  Call a phone quitline (1-800-QUIT-NOW), reach out to support groups, or work with a counselor.  Ask people who smoke to not smoke around you.  Avoid places that make you want to smoke,   such as: ? Bars. ? Parties. ? Smoke-break areas at work.  Spend time with people who do not smoke.  Lower the stress in your life. Stress can make you want to smoke. Try these things to help your stress: ? Getting regular exercise. ? Doing deep-breathing exercises. ? Doing yoga. ? Meditating. ? Doing a body scan. To do this, close your eyes, focus on one area of your body at a time from head to toe. Notice which parts of your body are tense. Try to relax the muscles in those areas. How will I feel when I quit smoking? Day 1 to 3 weeks Within the first 24 hours,  you may start to have some problems that come from quitting tobacco. These problems are very bad 2-3 days after you quit, but they do not often last for more than 2-3 weeks. You may get these symptoms:  Mood swings.  Feeling restless, nervous, angry, or annoyed.  Trouble concentrating.  Dizziness.  Strong desire for high-sugar foods and nicotine.  Weight gain.  Trouble pooping (constipation).  Feeling like you may vomit (nausea).  Coughing or a sore throat.  Changes in how the medicines that you take for other issues work in your body.  Depression.  Trouble sleeping (insomnia). Week 3 and afterward After the first 2-3 weeks of quitting, you may start to notice more positive results, such as:  Better sense of smell and taste.  Less coughing and sore throat.  Slower heart rate.  Lower blood pressure.  Clearer skin.  Better breathing.  Fewer sick days. Quitting smoking can be hard. Do not give up if you fail the first time. Some people need to try a few times before they succeed. Do your best to stick to your quit plan, and talk with your doctor if you have any questions or concerns. Summary  Smoking tobacco is the leading cause of preventable death. Quitting smoking can be hard, but it is one of the best things that you can do for your health.  When you decide to quit smoking, make a plan to help you succeed.  Quit smoking right away, not slowly over a period of time.  When you start quitting, seek help from your doctor, family, or friends. This information is not intended to replace advice given to you by your health care provider. Make sure you discuss any questions you have with your health care provider. Document Released: 06/17/2009 Document Revised: 11/08/2018 Document Reviewed: 11/09/2018 Elsevier Patient Education  2020 [employer]  

## 2019-04-15 NOTE — Progress Notes (Signed)
CC:  I have pain of my right knee. I would like an injection.  The patient has chronic pain of the right knee.  There is no recent trauma.  There is no redness.  Injections in the past have helped.  The knee has no redness, has an effusion and crepitus present.  ROM of the right knee is 0-95.  Impression:  Chronic knee pain right  Return: see Dr. Aline Brochure for evaluation for total knee around first of new year.    PROCEDURE NOTE:  The patient requests injections of the right knee , verbal consent was obtained.  The right knee was prepped appropriately after time out was performed.   Sterile technique was observed and injection of 1 cc of Depo-Medrol 40 mg with several cc's of plain xylocaine. Anesthesia was provided by ethyl chloride and a 20-gauge needle was used to inject the knee area. The injection was tolerated well.  A band aid dressing was applied.  The patient was advised to apply ice later today and tomorrow to the injection sight as needed.  Electronically Signed Sanjuana Kava, MD 8/11/202010:43 AM

## 2020-04-14 ENCOUNTER — Emergency Department (HOSPITAL_COMMUNITY): Payer: Medicare Other

## 2020-04-14 ENCOUNTER — Inpatient Hospital Stay (HOSPITAL_COMMUNITY)
Admission: EM | Admit: 2020-04-14 | Discharge: 2020-04-19 | DRG: 871 | Disposition: A | Discharge status: Home Health | Payer: Medicare Other | Attending: Family Medicine | Admitting: Family Medicine

## 2020-04-14 ENCOUNTER — Encounter (HOSPITAL_COMMUNITY): Payer: Self-pay | Admitting: Emergency Medicine

## 2020-04-14 ENCOUNTER — Other Ambulatory Visit: Payer: Self-pay

## 2020-04-14 DIAGNOSIS — J440 Chronic obstructive pulmonary disease with acute lower respiratory infection: Secondary | ICD-10-CM | POA: Diagnosis present

## 2020-04-14 DIAGNOSIS — F17210 Nicotine dependence, cigarettes, uncomplicated: Secondary | ICD-10-CM | POA: Diagnosis present

## 2020-04-14 DIAGNOSIS — E669 Obesity, unspecified: Secondary | ICD-10-CM

## 2020-04-14 DIAGNOSIS — Z6834 Body mass index (BMI) 34.0-34.9, adult: Secondary | ICD-10-CM | POA: Diagnosis not present

## 2020-04-14 DIAGNOSIS — R6520 Severe sepsis without septic shock: Secondary | ICD-10-CM | POA: Diagnosis present

## 2020-04-14 DIAGNOSIS — I4891 Unspecified atrial fibrillation: Secondary | ICD-10-CM

## 2020-04-14 DIAGNOSIS — H548 Legal blindness, as defined in USA: Secondary | ICD-10-CM | POA: Diagnosis present

## 2020-04-14 DIAGNOSIS — Z20822 Contact with and (suspected) exposure to covid-19: Secondary | ICD-10-CM | POA: Diagnosis present

## 2020-04-14 DIAGNOSIS — I480 Paroxysmal atrial fibrillation: Secondary | ICD-10-CM | POA: Diagnosis present

## 2020-04-14 DIAGNOSIS — J438 Other emphysema: Secondary | ICD-10-CM | POA: Diagnosis not present

## 2020-04-14 DIAGNOSIS — Z9884 Bariatric surgery status: Secondary | ICD-10-CM

## 2020-04-14 DIAGNOSIS — J9621 Acute and chronic respiratory failure with hypoxia: Secondary | ICD-10-CM | POA: Diagnosis present

## 2020-04-14 DIAGNOSIS — R531 Weakness: Secondary | ICD-10-CM

## 2020-04-14 DIAGNOSIS — Z7951 Long term (current) use of inhaled steroids: Secondary | ICD-10-CM | POA: Diagnosis not present

## 2020-04-14 DIAGNOSIS — Z7982 Long term (current) use of aspirin: Secondary | ICD-10-CM

## 2020-04-14 DIAGNOSIS — Z8673 Personal history of transient ischemic attack (TIA), and cerebral infarction without residual deficits: Secondary | ICD-10-CM | POA: Diagnosis not present

## 2020-04-14 DIAGNOSIS — R296 Repeated falls: Secondary | ICD-10-CM | POA: Diagnosis present

## 2020-04-14 DIAGNOSIS — W19XXXA Unspecified fall, initial encounter: Secondary | ICD-10-CM

## 2020-04-14 DIAGNOSIS — Z79899 Other long term (current) drug therapy: Secondary | ICD-10-CM

## 2020-04-14 DIAGNOSIS — A419 Sepsis, unspecified organism: Secondary | ICD-10-CM | POA: Diagnosis present

## 2020-04-14 DIAGNOSIS — J189 Pneumonia, unspecified organism: Secondary | ICD-10-CM | POA: Diagnosis present

## 2020-04-14 DIAGNOSIS — D72829 Elevated white blood cell count, unspecified: Secondary | ICD-10-CM | POA: Diagnosis not present

## 2020-04-14 DIAGNOSIS — I361 Nonrheumatic tricuspid (valve) insufficiency: Secondary | ICD-10-CM | POA: Diagnosis not present

## 2020-04-14 DIAGNOSIS — J449 Chronic obstructive pulmonary disease, unspecified: Secondary | ICD-10-CM | POA: Diagnosis present

## 2020-04-14 DIAGNOSIS — K219 Gastro-esophageal reflux disease without esophagitis: Secondary | ICD-10-CM | POA: Diagnosis present

## 2020-04-14 LAB — SARS CORONAVIRUS 2 BY RT PCR (HOSPITAL ORDER, PERFORMED IN ~~LOC~~ HOSPITAL LAB): SARS Coronavirus 2: NEGATIVE

## 2020-04-14 LAB — CBC WITH DIFFERENTIAL/PLATELET
Abs Immature Granulocytes: 0.19 10*3/uL — ABNORMAL HIGH (ref 0.00–0.07)
Basophils Absolute: 0.1 10*3/uL (ref 0.0–0.1)
Basophils Relative: 0 %
Eosinophils Absolute: 0 10*3/uL (ref 0.0–0.5)
Eosinophils Relative: 0 %
HCT: 41.9 % (ref 36.0–46.0)
Hemoglobin: 12.6 g/dL (ref 12.0–15.0)
Immature Granulocytes: 1 %
Lymphocytes Relative: 3 %
Lymphs Abs: 0.7 10*3/uL (ref 0.7–4.0)
MCH: 29.7 pg (ref 26.0–34.0)
MCHC: 30.1 g/dL (ref 30.0–36.0)
MCV: 98.8 fL (ref 80.0–100.0)
Monocytes Absolute: 0.9 10*3/uL (ref 0.1–1.0)
Monocytes Relative: 4 %
Neutro Abs: 18.9 10*3/uL — ABNORMAL HIGH (ref 1.7–7.7)
Neutrophils Relative %: 92 %
Platelets: 180 10*3/uL (ref 150–400)
RBC: 4.24 MIL/uL (ref 3.87–5.11)
RDW: 14.3 % (ref 11.5–15.5)
WBC: 20.8 10*3/uL — ABNORMAL HIGH (ref 4.0–10.5)
nRBC: 0 % (ref 0.0–0.2)

## 2020-04-14 LAB — BASIC METABOLIC PANEL
Anion gap: 10 (ref 5–15)
BUN: 18 mg/dL (ref 8–23)
CO2: 27 mmol/L (ref 22–32)
Calcium: 8.3 mg/dL — ABNORMAL LOW (ref 8.9–10.3)
Chloride: 100 mmol/L (ref 98–111)
Creatinine, Ser: 0.73 mg/dL (ref 0.44–1.00)
GFR calc Af Amer: >60 mL/min (ref >60)
GFR calc non Af Amer: >60 mL/min (ref >60)
Glucose, Bld: 124 mg/dL — ABNORMAL HIGH (ref 70–99)
Potassium: 3.8 mmol/L (ref 3.5–5.1)
Sodium: 137 mmol/L (ref 135–145)

## 2020-04-14 LAB — CK: Total CK: 129 U/L (ref 38–234)

## 2020-04-14 LAB — URINALYSIS, ROUTINE W REFLEX MICROSCOPIC
Bilirubin Urine: NEGATIVE
Glucose, UA: NEGATIVE mg/dL
Ketones, ur: NEGATIVE mg/dL
Leukocytes,Ua: NEGATIVE
Nitrite: NEGATIVE
Protein, ur: 30 mg/dL — AB
Specific Gravity, Urine: 1.017 (ref 1.005–1.030)
pH: 5 (ref 5.0–8.0)

## 2020-04-14 LAB — CBG MONITORING, ED: Glucose-Capillary: 136 mg/dL — ABNORMAL HIGH (ref 70–99)

## 2020-04-14 MED ORDER — SODIUM CHLORIDE 0.9 % IV SOLN
1.0000 g | Freq: Once | INTRAVENOUS | Status: AC
  Administered 2020-04-14: 1 g via INTRAVENOUS
  Filled 2020-04-14: qty 10

## 2020-04-14 MED ORDER — SODIUM CHLORIDE 0.9 % IV SOLN
500.0000 mg | Freq: Once | INTRAVENOUS | Status: AC
  Administered 2020-04-14: 500 mg via INTRAVENOUS
  Filled 2020-04-14: qty 500

## 2020-04-14 MED ORDER — ENOXAPARIN SODIUM 40 MG/0.4ML ~~LOC~~ SOLN: 40.0000 mg | SUBCUTANEOUS | Status: DC

## 2020-04-14 MED ORDER — SODIUM CHLORIDE 0.9 % IV SOLN
2.0000 g | INTRAVENOUS | Status: AC
  Administered 2020-04-15 – 2020-04-19 (×5): 2 g via INTRAVENOUS
  Filled 2020-04-14 (×5): qty 20

## 2020-04-14 MED ORDER — SODIUM CHLORIDE 0.9 % IV SOLN
500.0000 mg | INTRAVENOUS | Status: AC
  Administered 2020-04-15 – 2020-04-19 (×5): 500 mg via INTRAVENOUS
  Filled 2020-04-14 (×6): qty 500

## 2020-04-14 MED ORDER — BACITRACIN-NEOMYCIN-POLYMYXIN 400-5-5000 EX OINT
TOPICAL_OINTMENT | Freq: Once | CUTANEOUS | Status: AC
  Administered 2020-04-14: 1 via TOPICAL
  Filled 2020-04-14: qty 1

## 2020-04-14 NOTE — ED Notes (Signed)
Patient transported to X-ray 

## 2020-04-14 NOTE — ED Notes (Signed)
Patient had a large bowel movement. Patient also urinated when the pure wick was off and I was cleaning feces. No sample obtained.

## 2020-04-14 NOTE — ED Triage Notes (Addendum)
Patient c/o a fall today at 0600 when her right leg gave out and she heard a pop. Pt states she was lying on the floor until 1600 this afternoon when a friend found her. Pt is now complaining of lower back pain and right hip pain. Pt has multiple skin tear on both upper extremities.

## 2020-04-14 NOTE — H&P (Signed)
History and Physical  Meagan Holt ACZ:[id number] DOB: 05/23/1941 DOA: 04/14/2020  Referring physician: Evalee Jefferson, PA-C PCP: [name], MD  Outpatient Specialists: cardiology Bronson Ing, Lenise Herald, MD) Patient coming from: Home  Chief Complaint: Fall  HPI: Meagan Holt is a 79 y.o. female with medical history significant for arthritis, chronic back pain, COPD on supplemental oxygen via [place] at 2 LPM at night, CVA and frequent mechanical falls who presents to the emergency department via EMS due to fall sustained at home.  Patient states that she sustained a fall this morning around 6:30 AM from a standing position and landed on a carpet at her home, she had difficulty in being able to get up, so patient was on the floor until her neighbor who walks her dog checked on her in the afternoon and activated EMS.  Patient states that her legs gave up on her while ambulating with a walker.  She complained of right hip, knee pain and multiple abrasions on her arms.  Patient endorsed several days of productive cough (she was unable to tell me the color of sputum due to vision problems), she was not sure if she had any fever.  Patient continues to smoke cigarettes and denies increased shortness of breath.  ED Course: In the emergency department, she was intermittently tachypneic and was noted to be hypoxic with O2 sat of 87% on room air (only use supplemental oxygen at bedtime at home).  Work-up in the ED showed leukocytosis, hyperglycemia, SARS coronavirus was negative, urinalysis was positive for moderate hemoglobin urine dipstick, protein 30..  CT of head without contrast showed no acute intracranial process pathology.  Right knee x-ray showed moderate degenerative changes with small joint effusion but no acute bony abnormality.  Right hip x-ray showed no acute bony abnormality.  Chest x-ray showed patchy bilateral airspace opacities concerning for multifocal pneumonia.  Patient was started on IV  ceftriaxone and azithromycin due to presumed community-acquired pneumonia.  Hospitalist was asked to admit patient for further evaluation and management.  Review of Systems: Constitutional: Negative for chills and fever.  HENT: Negative for ear pain and sore throat.   Eyes: Negative for pain and visual disturbance.  Respiratory: Positive for cough,  negative for chest tightness and shortness of breath.   Cardiovascular: Negative for chest pain and palpitations.  Gastrointestinal: Negative for abdominal pain and vomiting.  Endocrine: Negative for polyphagia and polyuria.  Genitourinary: Negative for decreased urine volume, dysuria, enuresis, hematuria, vaginal discharge and vaginal pain.  Musculoskeletal: Positive for arthralgias and back pain.  Skin: Positive for abrasion in upper extremities.   Allergic/Immunologic: Negative for immunocompromised state.  Neurological: Positive for weakness.  Negative for tremors, syncope, speech difficulty, light-headedness and headaches.  Hematological: Does not bruise/bleed easily.  All other systems reviewed and are negative   Past Medical History:  Diagnosis Date  . Arthritis    "all my joints" (07/30/2013)  . Chronic back pain   . Chronic bronchitis ([place])    "get it q year" (07/30/2013)  . COPD (chronic obstructive pulmonary disease) ([place])   . Depression   . Falls frequently    "fell twice in the last 2 days" (07/30/2013)  . GERD (gastroesophageal reflux disease)    "rarely" (07/30/2013)  . H/O hiatal hernia   . History of blood transfusion   . Kidney stones   . Stroke [facility])    Past Surgical History:  Procedure Laterality Date  . APPENDECTOMY    . BACK SURGERY     "  put cages in mid back; did arthroscopic OR on lower back" (11/326/2014)  . BILATERAL OOPHORECTOMY Bilateral   . CATARACT EXTRACTION W/ INTRAOCULAR LENS  IMPLANT, BILATERAL Bilateral   . CHOLECYSTECTOMY    . EXCISIONAL HEMORRHOIDECTOMY    . GASTRIC BYPASS    . HERNIA  REPAIR     "removed my belly button too" (07/30/2013)  . KIDNEY STONE SURGERY     "twice" (07/30/2013)  . KNEE ARTHROSCOPY Left   . TONSILLECTOMY      Social History:  reports that she has been smoking cigarettes. She has a 42.00 pack-year smoking history. She has never used smokeless tobacco. She reports current alcohol use. She reports that she does not use drugs.   Allergies  Allergen Reactions  . Latex Itching and Swelling  . Doxycycline Rash    Family History  Problem Relation Age of Onset  . Stomach cancer Father     Prior to Admission medications   Medication Sig Start Date End Date Taking? Authorizing Provider  ADVAIR DISKUS 250-50 MCG/DOSE AEPB Inhale 1 puff into the lungs 2 (two) times daily. 04/18/17   [provider]  albuterol (PROVENTIL HFA;VENTOLIN HFA) 108 (90 BASE) MCG/ACT inhaler Inhale 1-2 puffs into the lungs every 6 (six) hours as needed for wheezing or shortness of breath.    [provider]  aspirin EC 325 MG tablet Take 325 mg by mouth every morning.    [provider]  buPROPion (WELLBUTRIN XL) 300 MG 24 hr tablet Take 300 mg by mouth at bedtime.     [provider]  FLUoxetine (PROZAC) 10 MG capsule Take 1 capsule by mouth every morning.  05/04/17   [provider]  furosemide (LASIX) 20 MG tablet Take 20 mg by mouth daily as needed for fluid or edema.    [provider]  [facility] ER 20 MG T24A  03/06/19   [provider]  ibuprofen (ADVIL,MOTRIN) 600 MG tablet Take 1 tablet by mouth 3 (three) times daily as needed for mild pain or moderate pain.  08/06/18   [provider]  Multiple Vitamin (MULTIVITAMIN WITH MINERALS) TABS tablet Take 1 tablet by mouth daily.    [provider]  Multiple Vitamins-Minerals (ICAPS PO) Take 1 tablet by mouth daily.    [provider]  Omega-3 Fatty Acids (FISH OIL) 1000 MG CAPS Take 3 capsules by mouth daily.    [provider]    Polyethyl Glycol-Propyl Glycol (SYSTANE) 0.4-0.3 % SOLN Apply 1-2 drops to eye daily as needed (for dry eye relief).    [provider]  zolpidem (AMBIEN) 5 MG tablet Take by mouth. 02/11/19   [provider]    Physical Exam: BP 108/70   Pulse 90   Temp 98.8 F (37.1 C) (Oral)   Resp 20   Ht 5\' 7"  (1.702 m)   Wt 99.8 kg   SpO2 92%   BMI 34.46 kg/m   . General: 79 y.o. year-old female well developed well nourished in no acute distress.  Alert and oriented x3. Marland Kitchen HEENT: NCAT, EOMI . Neck: Supple, trachea medial . Cardiovascular: Regular rate and rhythm with no rubs or gallops.  No thyromegaly or JVD noted.  No lower extremity edema. 2/4 pulses in all 4 extremities. Marland Kitchen Respiratory: Scattered wheezing on auscultation.  No accessory muscle use  . Abdomen: Soft nontender nondistended with normal bowel sounds x4 quadrants. . Muskuloskeletal: Tender to palpation of right lateral knee joint.  No cyanosis, clubbing or  edema noted bilaterally . Neuro: CN II-XII intact, strength, sensation, reflexes . Skin: Abrasions noted on bilateral forearms.   Marland Kitchen Psychiatry: Judgement and insight appear normal. Mood is appropriate for condition and setting          Labs on Admission:  Basic Metabolic Panel: Recent Labs  Lab 04/14/20 1733  NA 137  K 3.8  CL 100  CO2 27  GLUCOSE 124*  BUN 18  CREATININE 0.73  CALCIUM 8.3*   Liver Function Tests: No results for input(s): AST, ALT, ALKPHOS, BILITOT, PROT, ALBUMIN in the last 168 hours. No results for input(s): LIPASE, AMYLASE in the last 168 hours. No results for input(s): AMMONIA in the last 168 hours. CBC: Recent Labs  Lab 04/14/20 1733  WBC 20.8*  NEUTROABS 18.9*  HGB 12.6  HCT 41.9  MCV 98.8  PLT 180   Cardiac Enzymes: Recent Labs  Lab 04/14/20 1733  CKTOTAL 129    BNP (last 3 results) No results for input(s): BNP in the last 8760 hours.  ProBNP (last 3 results) No results for input(s): PROBNP in the last  8760 hours.  CBG: Recent Labs  Lab 04/14/20 1706  GLUCAP 136*    Radiological Exams on Admission: CT Head Wo Contrast  Result Date: 04/14/2020 CLINICAL DATA:  79 year old female with head trauma. EXAM: CT HEAD WITHOUT CONTRAST TECHNIQUE: Contiguous axial images were obtained from the base of the skull through the vertex without intravenous contrast. COMPARISON:  Head CT dated 01/30/2019. FINDINGS: Evaluation of this exam is limited due to motion artifact. Brain: Mild age-related atrophy and chronic microvascular ischemic changes. There is no acute intracranial hemorrhage. No mass effect or midline shift no extra-axial fluid collection. Vascular: No hyperdense vessel or unexpected calcification. Skull: Normal. Negative for fracture or focal lesion. Sinuses/Orbits: No acute finding. Other: None IMPRESSION: 1. No acute intracranial pathology. 2. Mild age-related atrophy and chronic microvascular ischemic changes. Electronically Signed   By: Anner Crete M.D.   On: 04/14/2020 20:55   DG Chest Portable 1 View  Result Date: 04/14/2020 CLINICAL DATA:  Right hip and knee pain.  Fall. EXAM: PORTABLE CHEST 1 VIEW COMPARISON:  05/20/2017 FINDINGS: Cardiomegaly. Right upper lobe and patchy left perihilar and lower lobe airspace opacities concerning for pneumonia. No effusions. No acute bony abnormality. IMPRESSION: Patchy bilateral airspace opacities concerning for multifocal pneumonia. Electronically Signed   By: Rolm Baptise M.D.   On: 04/14/2020 18:35   DG Knee Complete 4 Views Right  Result Date: 04/14/2020 CLINICAL DATA:  Fall, right knee pain EXAM: RIGHT KNEE - COMPLETE 4+ VIEW COMPARISON:  None. FINDINGS: Moderate tricompartment degenerative changes with joint space narrowing and spurring. Small joint effusion. No acute bony abnormality. Specifically, no fracture, subluxation, or dislocation. Vascular calcifications noted. IMPRESSION: Moderate degenerative changes. Small joint effusion. No acute  bony abnormality. Electronically Signed   By: Rolm Baptise M.D.   On: 04/14/2020 18:36   DG HIP UNILAT W OR W/O PELVIS 2-3 VIEWS RIGHT  Result Date: 04/14/2020 CLINICAL DATA:  Fall, right hip pain EXAM: DG HIP (WITH OR WITHOUT PELVIS) 2-3V RIGHT COMPARISON:  None. FINDINGS: Mild symmetric degenerative changes in the hips. Postoperative changes in the visualized lower lumbar spine. SI joints symmetric and unremarkable. No acute bony abnormality. Specifically, no fracture, subluxation, or dislocation. IMPRESSION: No acute bony abnormality. Electronically Signed   By: Rolm Baptise M.D.   On: 04/14/2020 18:35    EKG: I independently viewed the EKG done and my findings are as followed: A.  fib with RVR with multiple PVCs  Assessment/Plan Present on Admission: . CAP (community acquired pneumonia) . COPD (chronic obstructive pulmonary disease) (HCC)  Principal Problem:   CAP (community acquired pneumonia) Active Problems:   COPD (chronic obstructive pulmonary disease) ([place])   History of stroke   Acute on chronic respiratory failure with hypoxia (HCC)   Recurrent falls   Atrial fibrillation with RVR (HCC)   Leukocytosis   Obesity (BMI 30.0-34.9)  Acute on chronic hypoxic respiratory failure in the setting of community-acquired pneumonia POA Chest x-ray showed patchy bilateral airspace opacities concerning for multifocal pneumonia.   PORT/PSI of 81 points  indicating 0.9-2.8% mortality She was started on IV ceftriaxone and azithromycin, we shall continue with same at this time with plan to de-escalate based on procalcitonin, blood culture and sputum culture Continue Mucinex, incentive spirometry, flutter valve  Blood culture and sputum culture pending Urine Legionella and strep pneumo urinary antigen pending  Leukocytosis possibly secondary to above vs reactive WBC 20.8; continue treatment as per above Continue to monitor WBC with no labs.  Recurrent falls Patient ambulates with a walker  at baseline, she lives alone and she has had mechanical falls at other times at home Continue fall precaution and neurochecks Continue PT/OT eval and treat Patient may need to be discharged to a SNF due to history of recurrent falls as stated above  COPD Continue Ventolin and Dulera  History of stroke Continue aspirin, patient was not on statin per home meds.  We shall await updated med rec Continue omega-3 fatty acids  Paroxysmal A. fib with RVR Patient was noted to be in A. fib with RVR on arrival to the ED, but this improved to A. fib with rate control CHA2DS2-VASc score is 5 which is equal to 7.2 % annual risk of stroke HAS-BLED score = 2 Echocardiogram will be done in the morning to rule out any valvular or structural defects Lovenox (therapeutic dose) x1 was given at this time Patient may not be a good candidate for long-term anticoagulation considering her history of recurrent falls Consider rate control medication for persistent A. fib  Obesity (BMI 34.46) Patient was counseled about the cardiovascular and metabolic risk of morbid obesity. Patient was counseled for diet control, exercise regimen and weight loss.    DVT prophylaxis: Lovenox  Code Status: Full code  Family Communication: Neighbor at bedside (all questions answered to satisfaction)  Disposition Plan:  Patient is from:                        home Anticipated DC to:                   SNF  Anticipated DC date:               2-3 days Anticipated DC barriers:         Patient unstable to discharge at this time considering current treatment for pneumonia and need for PT/OT eval and treat due to recurrent falls   Consults called: None  Admission status: Inpatient   [name] MD [facility]  If 7PM-7AM, please contact night-coverage [email or web address]  04/14/2020, 10:55 PM

## 2020-04-14 NOTE — ED Provider Notes (Signed)
[facility] EMERGENCY DEPARTMENT Provider Note   CSN: [id number] Arrival date & time: 04/14/20  1618     History Chief Complaint  Patient presents with  . Fall    Meagan Holt is a 79 y.o. female with a history of arthritis, chronic back pain, COPD and frequent falls along with history of cva presenting with a fall which occurred around 6:30 am today, landing on carpet in her home.  She was unable to get up.  Her neighbor who walks her dog in the afternoon, found her just prior to arrival here.  She endorses falls occur from weakness in her legs which is not a new finding.  She did hit her head on the floor, denies loc.  She has pain in her right hip and knee and multiple abrasions on her arms.  She also reports an increased cough which has been present for several days.  She is unsure if she has had any fevers, cannot see well enough to read a thermometer.  Denies fevers or chills, denies chest pain, denies increased sob, uses home oxygen 2L at night.       The history is provided by the patient.       Past Medical History:  Diagnosis Date  . Arthritis    "all my joints" (07/30/2013)  . Chronic back pain   . Chronic bronchitis ([place])    "get it q year" (07/30/2013)  . COPD (chronic obstructive pulmonary disease) ([place])   . Depression   . Falls frequently    "fell twice in the last 2 days" (07/30/2013)  . GERD (gastroesophageal reflux disease)    "rarely" (07/30/2013)  . H/O hiatal hernia   . History of blood transfusion   . Kidney stones   . Stroke [facility])     Patient Active Problem List   Diagnosis Date Noted  . Benign paroxysmal positional vertigo 09/08/2018  . Cellulitis of right lower leg 09/08/2018  . Orthostatic hypotension 05/22/2017  . Syncope and collapse 05/21/2017  . Chronic pain syndrome 05/21/2017  . Altered mental status 05/20/2017  . GERD (gastroesophageal reflux disease) 05/20/2017  . Depression 05/20/2017  . History of stroke 05/20/2017  .  Premature beats 05/20/2017  . Cervical spondylosis without myelopathy 05/15/2017  . Lumbar spondylosis 05/15/2017  . 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 Decreased range of motion of wrist 11/28/2016  . Anxiety 05/10/2016  . Head injury 07/30/2013  . Difficulty walking 07/30/2013  . COPD (chronic obstructive pulmonary disease) ([place]) 07/30/2013    Past Surgical History:  Procedure Laterality Date  . APPENDECTOMY    . BACK SURGERY     "put cages in mid back; did arthroscopic OR on lower back" (11/326/2014)  . BILATERAL OOPHORECTOMY Bilateral   . CATARACT EXTRACTION W/ INTRAOCULAR LENS  IMPLANT, BILATERAL Bilateral   . CHOLECYSTECTOMY    . EXCISIONAL HEMORRHOIDECTOMY    . GASTRIC BYPASS    . HERNIA REPAIR     "removed my belly button too" (07/30/2013)  . KIDNEY STONE SURGERY     "twice" (07/30/2013)  . KNEE ARTHROSCOPY Left   . TONSILLECTOMY       OB History   No obstetric history on file.     Family History  Problem Relation Age of Onset  . Stomach cancer Father     Social History   Tobacco Use  . Smoking status: Current Every Day Smoker    Packs/day: 1.00    Years:  42.00    Pack years: 42.00    Types: Cigarettes  . Smokeless tobacco: Never Used  Substance Use Topics  . Alcohol use: Yes    Comment: rarely   . Drug use: No    Home Medications Prior to Admission medications   Medication Sig Start Date End Date Taking? Authorizing Provider  ADVAIR DISKUS 250-50 MCG/DOSE AEPB Inhale 1 puff into the lungs 2 (two) times daily. 04/18/17   [provider]  albuterol (PROVENTIL HFA;VENTOLIN HFA) 108 (90 BASE) MCG/ACT inhaler Inhale 1-2 puffs into the lungs every 6 (six) hours as needed for wheezing or shortness of breath.    [provider]  aspirin EC 325 MG tablet Take 325 mg by mouth every morning.    [provider]  buPROPion (WELLBUTRIN XL) 300 MG 24 hr tablet Take 300 mg by mouth at bedtime.      [provider]  FLUoxetine (PROZAC) 10 MG capsule Take 1 capsule by mouth every morning.  05/04/17   [provider]  furosemide (LASIX) 20 MG tablet Take 20 mg by mouth daily as needed for fluid or edema.    [provider]  [facility] ER 20 MG T24A  03/06/19   [provider]  ibuprofen (ADVIL,MOTRIN) 600 MG tablet Take 1 tablet by mouth 3 (three) times daily as needed for mild pain or moderate pain.  08/06/18   [provider]  Multiple Vitamin (MULTIVITAMIN WITH MINERALS) TABS tablet Take 1 tablet by mouth daily.    [provider]  Multiple Vitamins-Minerals (ICAPS PO) Take 1 tablet by mouth daily.    [provider]  Omega-3 Fatty Acids (FISH OIL) 1000 MG CAPS Take 3 capsules by mouth daily.    [provider]  Polyethyl Glycol-Propyl Glycol (SYSTANE) 0.4-0.3 % SOLN Apply 1-2 drops to eye daily as needed (for dry eye relief).    [provider]  zolpidem (AMBIEN) 5 MG tablet Take by mouth. 02/11/19   [provider]    Allergies    Latex and Doxycycline  Review of Systems   Review of Systems  Constitutional: Negative for chills and fever.  HENT: Negative for congestion and sore throat.   Eyes: Negative.   Respiratory: Positive for cough. Negative for chest tightness and shortness of breath.   Cardiovascular: Negative for chest pain and leg swelling.  Gastrointestinal: Negative for abdominal pain, nausea and vomiting.  Genitourinary: Negative.   Musculoskeletal: Positive for arthralgias and back pain. Negative for joint swelling and neck pain.  Skin: Positive for wound. Negative for rash.  Neurological: Positive for weakness. Negative for dizziness, light-headedness, numbness and headaches.  Psychiatric/Behavioral: Negative.     Physical Exam Updated Vital Signs BP 123/62   Pulse 64   Temp 98.8 F (37.1 C) (Oral)   Resp 19   Ht 5\' 7"  (1.702 m)   Wt 99.8 kg   SpO2 95%   BMI 34.46 kg/m    Physical Exam Vitals and nursing note reviewed.  Constitutional:      Appearance: She is well-developed.  HENT:     Head: Normocephalic and atraumatic.     Mouth/Throat:     Mouth: Mucous membranes are dry.  Eyes:     Conjunctiva/sclera: Conjunctivae normal.  Cardiovascular:     Rate and Rhythm: Normal rate and regular rhythm.     Pulses:          Dorsalis pedis pulses are 2+ on the right side and 2+ on the left  side.     Heart sounds: Normal heart sounds.  Pulmonary:     Effort: Pulmonary effort is normal.     Breath sounds: Rhonchi present. No wheezing.  Abdominal:     General: Bowel sounds are normal.     Palpations: Abdomen is soft.     Tenderness: There is no abdominal tenderness.  Musculoskeletal:        General: Tenderness present.     Cervical back: Normal range of motion.     Right hip: Tenderness present. Decreased range of motion.     Right knee: Swelling and bony tenderness present. No deformity or ecchymosis. Normal patellar mobility.     Comments: Patient can rotate her right hip and flex minimally but with some discomfort.  At baseline she is holding it in external rotation.  Tender to palpation in right lateral knee joint.   Skin:    General: Skin is warm and dry.     Comments: Small superficial abrasions on bilateral forearms.  Neurological:     Mental Status: She is alert.     ED Results / Procedures / Treatments   Labs (all labs ordered are listed, but only abnormal results are displayed) Labs Reviewed  CBC WITH DIFFERENTIAL/PLATELET - Abnormal; Notable for the following components:      Result Value   WBC 20.8 (*)    Neutro Abs 18.9 (*)    Abs Immature Granulocytes 0.19 (*)    All other components within normal limits  BASIC METABOLIC PANEL - Abnormal; Notable for the following components:   Glucose, Bld 124 (*)    Calcium 8.3 (*)    All other components within normal limits  CBG MONITORING, ED - Abnormal; Notable for the following components:    Glucose-Capillary 136 (*)    All other components within normal limits  SARS CORONAVIRUS 2 BY RT PCR (HOSPITAL ORDER, [place])  CK  URINALYSIS, ROUTINE W REFLEX MICROSCOPIC    EKG EKG Interpretation  Date/Time:  Wednesday April 14 2020 16:43:09 EDT Ventricular Rate:  104 PR Interval:    QRS Duration: 100 QT Interval:  363 QTC Calculation: 478 R Axis:   119 Text Interpretation: Atrial fibrillation Multiple ventricular premature complexes Right axis deviation Low voltage, extremity leads Anteroseptal infarct, age indeterminate Nonspecific repol abnormality, diffuse leads Confirmed by Davonna Belling [phone]) on 04/14/2020 7:22:01 PM   Radiology DG Chest Portable 1 View  Result Date: 04/14/2020 CLINICAL DATA:  Right hip and knee pain.  Fall. EXAM: PORTABLE CHEST 1 VIEW COMPARISON:  05/20/2017 FINDINGS: Cardiomegaly. Right upper lobe and patchy left perihilar and lower lobe airspace opacities concerning for pneumonia. No effusions. No acute bony abnormality. IMPRESSION: Patchy bilateral airspace opacities concerning for multifocal pneumonia. Electronically Signed   By: Rolm Baptise M.D.   On: 04/14/2020 18:35   DG Knee Complete 4 Views Right  Result Date: 04/14/2020 CLINICAL DATA:  Fall, right knee pain EXAM: RIGHT KNEE - COMPLETE 4+ VIEW COMPARISON:  None. FINDINGS: Moderate tricompartment degenerative changes with joint space narrowing and spurring. Small joint effusion. No acute bony abnormality. Specifically, no fracture, subluxation, or dislocation. Vascular calcifications noted. IMPRESSION: Moderate degenerative changes. Small joint effusion. No acute bony abnormality. Electronically Signed   By: Rolm Baptise M.D.   On: 04/14/2020 18:36   DG HIP UNILAT W OR W/O PELVIS 2-3 VIEWS RIGHT  Result Date: 04/14/2020 CLINICAL DATA:  Fall, right hip pain EXAM: DG HIP (WITH OR WITHOUT PELVIS) 2-3V RIGHT COMPARISON:  None. FINDINGS: Mild symmetric degenerative  changes in the hips. Postoperative changes in the visualized lower lumbar spine. SI joints symmetric and unremarkable. No acute bony abnormality. Specifically, no fracture, subluxation, or dislocation. IMPRESSION: No acute bony abnormality. Electronically Signed   By: Rolm Baptise M.D.   On: 04/14/2020 18:35    Procedures Procedures (including critical care time)  Medications Ordered in ED Medications  neomycin-bacitracin-polymyxin (NEOSPORIN) ointment packet (has no administration in time range)  cefTRIAXone (ROCEPHIN) 1 g in sodium chloride 0.9 % 100 mL IVPB (has no administration in time range)  azithromycin (ZITHROMAX) 500 mg in sodium chloride 0.9 % 250 mL IVPB (has no administration in time range)    ED Course  I have reviewed the triage vital signs and the nursing notes.  Pertinent labs & imaging results that were available during my care of the patient were reviewed by me and considered in my medical decision making (see chart for details).    MDM Rules/Calculators/A&P                          Pt with increased fall and weakness, cough for several days, multifocal pneumonia, covid negative today.  Labs reviewed, no rhabdo despite lying on her floor all day today before obtaining assistance.  She will need admission for tx of CAP and increased weakness.  Rocephin and zithromax added.  Pending head Ct to rule out intracranial injury with todays fall.  CT currently closed due to covid + pt protocol.  Discussed pt with Dr. Josephine Cables who accepts pt for admission.   Final Clinical Impression(s) / ED Diagnoses Final diagnoses:  Community acquired pneumonia, unspecified laterality  Fall, initial encounter  Weakness    Rx / DC Orders ED Discharge Orders    None       Landis Martins 04/14/20 Maurene Capes, MD 04/14/20 2316

## 2020-04-15 ENCOUNTER — Inpatient Hospital Stay (HOSPITAL_COMMUNITY): Payer: Medicare Other

## 2020-04-15 DIAGNOSIS — I361 Nonrheumatic tricuspid (valve) insufficiency: Secondary | ICD-10-CM | POA: Diagnosis not present

## 2020-04-15 DIAGNOSIS — I4891 Unspecified atrial fibrillation: Secondary | ICD-10-CM | POA: Diagnosis not present

## 2020-04-15 LAB — ECHOCARDIOGRAM COMPLETE
AR max vel: 2.41 cm2
AV Area VTI: 2.36 cm2
AV Area mean vel: 1.91 cm2
AV Mean grad: 3.4 mmHg
AV Peak grad: 6.2 mmHg
Ao pk vel: 1.25 m/s
Area-P 1/2: 4.36 cm2
Height: 67 in
S' Lateral: 2.38 cm
Weight: 3520 oz

## 2020-04-15 LAB — HIV ANTIBODY (ROUTINE TESTING W REFLEX): HIV Screen 4th Generation wRfx: NONREACTIVE

## 2020-04-15 LAB — PROCALCITONIN: Procalcitonin: 0.58 ng/mL

## 2020-04-15 LAB — STREP PNEUMONIAE URINARY ANTIGEN: Strep Pneumo Urinary Antigen: NEGATIVE

## 2020-04-15 MED ORDER — ENOXAPARIN SODIUM 100 MG/ML ~~LOC~~ SOLN: 100.0000 mg | Freq: Once | SUBCUTANEOUS | Status: DC

## 2020-04-15 MED ORDER — ENOXAPARIN SODIUM 40 MG/0.4ML ~~LOC~~ SOLN
40.0000 mg | SUBCUTANEOUS | Status: DC
  Administered 2020-04-15 – 2020-04-19 (×5): 40 mg via SUBCUTANEOUS
  Filled 2020-04-15 (×5): qty 0.4

## 2020-04-15 MED ORDER — GUAIFENESIN ER 600 MG PO TB12
600.0000 mg | ORAL_TABLET | Freq: Two times a day (BID) | ORAL | Status: DC
  Administered 2020-04-15 – 2020-04-19 (×9): 600 mg via ORAL
  Filled 2020-04-15 (×8): qty 1

## 2020-04-15 MED ORDER — ALBUTEROL SULFATE HFA 108 (90 BASE) MCG/ACT IN AERS: 1.0000 | INHALATION_SPRAY | Freq: Four times a day (QID) | RESPIRATORY_TRACT | Status: DC | PRN

## 2020-04-15 MED ORDER — OMEGA-3-ACID ETHYL ESTERS 1 G PO CAPS
1.0000 g | ORAL_CAPSULE | Freq: Every day | ORAL | Status: DC
  Administered 2020-04-15 – 2020-04-19 (×5): 1 g via ORAL
  Filled 2020-04-15 (×5): qty 1

## 2020-04-15 MED ORDER — ASPIRIN EC 325 MG PO TBEC
325.0000 mg | DELAYED_RELEASE_TABLET | Freq: Every morning | ORAL | Status: DC
  Administered 2020-04-15 – 2020-04-19 (×5): 325 mg via ORAL
  Filled 2020-04-15 (×5): qty 1

## 2020-04-15 MED ORDER — IPRATROPIUM-ALBUTEROL 0.5-2.5 (3) MG/3ML IN SOLN
3.0000 mL | Freq: Three times a day (TID) | RESPIRATORY_TRACT | Status: DC
  Administered 2020-04-15 (×2): 3 mL via RESPIRATORY_TRACT
  Filled 2020-04-15 (×2): qty 3

## 2020-04-15 MED ORDER — DM-GUAIFENESIN ER 30-600 MG PO TB12
1.0000 | ORAL_TABLET | Freq: Two times a day (BID) | ORAL | Status: DC
  Administered 2020-04-15 – 2020-04-19 (×9): 1 via ORAL
  Filled 2020-04-15 (×9): qty 1

## 2020-04-15 MED ORDER — ATORVASTATIN CALCIUM 20 MG PO TABS
20.0000 mg | ORAL_TABLET | Freq: Every day | ORAL | Status: DC
  Administered 2020-04-16 – 2020-04-19 (×4): 20 mg via ORAL
  Filled 2020-04-15 (×4): qty 1

## 2020-04-15 MED ORDER — ALBUTEROL SULFATE (2.5 MG/3ML) 0.083% IN NEBU
2.5000 mg | INHALATION_SOLUTION | Freq: Four times a day (QID) | RESPIRATORY_TRACT | Status: DC | PRN
  Administered 2020-04-16 – 2020-04-18 (×3): 2.5 mg via RESPIRATORY_TRACT
  Filled 2020-04-15 (×3): qty 3

## 2020-04-15 MED ORDER — IPRATROPIUM-ALBUTEROL 0.5-2.5 (3) MG/3ML IN SOLN
3.0000 mL | Freq: Three times a day (TID) | RESPIRATORY_TRACT | Status: DC
  Administered 2020-04-16 – 2020-04-19 (×11): 3 mL via RESPIRATORY_TRACT
  Filled 2020-04-15 (×11): qty 3

## 2020-04-15 MED ORDER — MOMETASONE FURO-FORMOTEROL FUM 200-5 MCG/ACT IN AERO
2.0000 | INHALATION_SPRAY | Freq: Two times a day (BID) | RESPIRATORY_TRACT | Status: DC
  Administered 2020-04-15 – 2020-04-19 (×9): 2 via RESPIRATORY_TRACT
  Filled 2020-04-15: qty 8.8

## 2020-04-15 NOTE — Progress Notes (Signed)
*  PRELIMINARY RESULTS* Echocardiogram 2D Echocardiogram has been performed.  Leavy Cella 04/15/2020, 12:16 PM

## 2020-04-15 NOTE — Evaluation (Signed)
Occupational Therapy Evaluation Patient Details Name: Meagan Holt MRN: [id number] DOB: [birth year] Today's Date: 04/15/2020    History of Present Illness Meagan Holt is a 79 y.o. female with medical history significant for arthritis, chronic back pain, COPD on supplemental oxygen via [place] at 2 LPM at night, CVA and frequent mechanical falls who presents to the emergency department via EMS due to fall sustained at home.  Patient states that she sustained a fall this morning around 6:30 AM from a standing position and landed on a carpet at her home, she had difficulty in being able to get up, so patient was on the floor until her neighbor who walks her dog checked on her in the afternoon and activated EMS.  Patient states that her legs gave up on her while ambulating with a walker.  She complained of right hip, knee pain and multiple abrasions on her arms.    Clinical Impression   Pt seen with PT this am, reports pain in right hip during session. Pt limited in functional task completion due to RLE pain and weakness. Pt with severe vision deficits that required at least set-up for tasks as she cannot see her environment. Pt with limited mobility due to pain and weakness as well. Pt lives alone and has no family to assist her. Recommend SNF on discharge to improve pt safety and independence in ADL completion. Will continue to follow while in acute care.     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 Restrictions Weight Bearing Restrictions: No      Mobility Bed Mobility Overal bed mobility: Needs Assistance Bed Mobility: Supine to Sit     Supine to sit: Min assist     General bed mobility comments: cuing for pushing up on RUE  Transfers Overall transfer level: Needs  assistance Equipment used: Rolling walker (2 wheeled) Transfers: Sit to/from [facility];Lateral/Scoot Transfers Sit to Stand: Mod assist Stand pivot transfers: Mod assist      Lateral/Scoot Transfers: Min assist General transfer comment: pt hesitant in scooting transfer, VC's to engage UE's to assist w the task        ADL either performed or assessed with clinical judgement   ADL Overall ADL's : Needs assistance/impaired Eating/Feeding: Set up;Sitting Eating/Feeding Details (indicate cue type and reason): Pt is unable to self-feed due to poor eyesight, is able to bring drink to her mouth when positioned in her hand Grooming: Wash/dry hands;Set up;Sitting               Lower Body Dressing: Total assistance;Bed level Lower Body Dressing Details (indicate cue type and reason): total assist for donning socks Toilet Transfer: Moderate assistance;Stand-pivot;RW Toilet Transfer Details (indicate cue type and reason): simulated with bed to chair transfer           General ADL Comments: Pt with very poor eyesight, requires environmental cuing and hand placement on call bell to operate     Vision Baseline Vision/History: Legally blind Patient Visual Report: No change from baseline              Pertinent Vitals/Pain Pain Assessment: Faces Faces Pain Scale: Hurts even more Pain Location: RLE Pain Descriptors / Indicators: Grimacing;Moaning;Sore;Sharp Pain Intervention(s): Limited activity within patient's tolerance;Monitored during session;Repositioned;Relaxation     Hand Dominance Right  Extremity/Trunk Assessment Upper Extremity Assessment Upper Extremity Assessment: Generalized weakness   Lower Extremity Assessment Lower Extremity Assessment: Generalized weakness (possibly due to pain in RLE)   Cervical / Trunk Assessment Cervical / Trunk Assessment: Normal   Communication Communication Communication: No difficulties   Cognition  Arousal/Alertness: Awake/alert Behavior During Therapy: WFL for tasks assessed/performed Overall Cognitive Status: History of cognitive impairments - at baseline                                 General Comments: mild confusion   General Comments               Home Living Family/patient expects to be discharged to:: Private residence Living Arrangements: Alone Available Help at Discharge: Neighbor;Available PRN/intermittently (neighbors help out daily) Type of Home: Apartment Home Access: Level entry   Entrance Stairs-Rails: None Home Layout: One level     Bathroom Shower/Tub: Teacher, early years/pre: Standard Bathroom Accessibility: Yes   Home Equipment: Walker - 4 wheels;Shower seat;Grab bars - tub/shower;Cane - quad   Additional Comments: pt slightly confused      Prior Functioning/Environment Level of Independence: Independent with assistive device(s)  Gait / Transfers Assistance Needed: pt uses RW or quad cane for mobility ADL's / Homemaking Assistance Needed: neighbors assist with meal preparation and occasional ADLs            OT Problem List: Decreased strength;Decreased activity tolerance;Impaired balance (sitting and/or standing);Decreased safety awareness;Decreased knowledge of use of DME or AE;Pain      OT Treatment/Interventions: Self-care/ADL training;Therapeutic exercise;DME and/or AE instruction;Therapeutic activities;Patient/family education    OT Goals(Current goals can be found in the care plan section) Acute Rehab OT Goals Patient Stated Goal: go home w 24 hour assist OT Goal Formulation: With patient Time For Goal Achievement: 04/29/20 Potential to Achieve Goals: Good  OT Frequency: Min 2X/week   Barriers to D/C: Decreased caregiver support          Co-evaluation PT/OT/SLP Co-Evaluation/Treatment: Yes Reason for Co-Treatment: To address functional/ADL transfers   OT goals addressed during session: ADL's and  self-care         End of Session Equipment Utilized During Treatment: Gait belt;Rolling walker;Oxygen Nurse Communication: Other (comment) (needs assist with eating breakfast)  Activity Tolerance: Patient tolerated treatment well Patient left: in chair;with call bell/phone within reach;with family/visitor present  OT Visit Diagnosis: Repeated falls (R29.6);Muscle weakness (generalized) (M62.81)                Time: 3291-9166 OT Time Calculation (min): 27 min Charges:  OT General Charges $OT Visit: 1 Visit OT Evaluation $OT Eval Low Complexity: [place], OTR/L  [phone] 04/15/2020, 10:19 AM

## 2020-04-15 NOTE — Progress Notes (Signed)
ANTICOAGULATION CONSULT NOTE - Initial Consult  Pharmacy Consult for Lovenox x 1 dose Indication: atrial fibrillation  Allergies  Allergen Reactions  . Latex Itching and Swelling  . Doxycycline Rash    Patient Measurements: Height: 5\' 7"  (170.2 cm) Weight: 99.8 kg (220 lb) IBW/kg (Calculated) : 61.6  Vital Signs: Temp: 98.8 F (37.1 C) (08/11 1640) Temp Source: Oral (08/11 1640) BP: 108/70 (08/11 2130) Pulse Rate: 90 (08/11 2130)  Labs: Recent Labs    04/14/20 1733  HGB 12.6  HCT 41.9  PLT 180  CREATININE 0.73  CKTOTAL 129    Estimated Creatinine Clearance: 67 mL/min (by C-G formula based on SCr of 0.73 mg/dL).   Medical History: Past Medical History:  Diagnosis Date  . Arthritis    "all my joints" (07/30/2013)  . Chronic back pain   . Chronic bronchitis ([place])    "get it q year" (07/30/2013)  . COPD (chronic obstructive pulmonary disease) ([place])   . Depression   . Falls frequently    "fell twice in the last 2 days" (07/30/2013)  . GERD (gastroesophageal reflux disease)    "rarely" (07/30/2013)  . H/O hiatal hernia   . History of blood transfusion   . Kidney stones   . Stroke [facility])    Assessment: 79 y/o F with afib, requested to provide one dose of therapeutic Lovenox while deciding on long term anti-coagulation. CBC/renal function good. PTA meds reviewed.   Goal of Therapy:  Monitor platelets by anticoagulation protocol: Yes   Plan:  Lovenox 100 mg subcutaneous x 1  F/U with MD later today regarding additional anti-coagulation   Narda Bonds, PharmD, BCPS Clinical Pharmacist Phone: [phone]

## 2020-04-15 NOTE — Progress Notes (Signed)
Patient Demographics:    Meagan Holt, is a 79 y.o. female, DOB - April 15, 1941, AST:[id number]  Admit date - 04/14/2020   Admitting Physician [name], DO  Outpatient Primary MD for the patient is [name], MD  LOS - 1   Chief Complaint  Patient presents with  . Fall        Subjective:    Meagan Holt today has no fevers, no emesis,  No chest pain,    dyspnea on exertion, cough, shortness of breath at rest and weakness and fatigue persist -Female friend and neighbor at bedside  Assessment  & Plan :    Principal Problem:   CAP (community acquired pneumonia) Active Problems:   COPD (chronic obstructive pulmonary disease) ([place])   History of stroke   Acute on chronic respiratory failure with hypoxia (HCC)   Recurrent falls   Atrial fibrillation with RVR (HCC)   Leukocytosis   Obesity (BMI 30.0-34.9)  Brief Summary:- 79 y.o. female with medical history significant for arthritis, chronic back pain, COPD on supplemental oxygen via  at 2 LPM at night, CVA and frequent mechanical falls admitted on 04/14/2020 after mechanical fall at home and found to have bilateral pneumonia with acute hypoxic respiratory failure  A/p 1)Bilateral Pneumonia---POA--treat empirically with Rocephin and azithromycin, mucolytics, bronchodilators and supplemental oxygen as needed  2)PAFib with RVR--- Echo with EF of 60 to 65%, -CHA2DS2-VASc score 5, annual stroke risk 7.2 -however patient has has bled score of 2 and history of recurrent falls -Risk versus benefits of FULL anticoagulation discussed with patient and her friend -Decision not to do for anticoagulation at this time  3)Ambulatory dysfunction/back pain/recurrent falls----PTA patient lived alone, does not have caregivers on 24/7 supervision,  --PT/OT recommends SNF rehab  4) history of prior stroke--aspirin and Lipitor as ordered  5)COPD---  able, continue bronchodilators as above #1  Disposition/Need for in-Hospital Stay- patient unable to be discharged at this time due to --- pneumonia with hypoxia requiring IV antibiotics and supplemental oxygen*  Status is: Inpatient  Remains inpatient appropriate because:- pneumonia requiring IV antibiotics and supplemental oxygen*   Disposition: The patient is from: Home              Anticipated d/c is to: SNF              Anticipated d/c date is: 1 day              Patient currently is not medically stable to d/c. Barriers: Not Clinically Stable- -- pneumonia requiring IV antibiotics and supplemental oxygen*  Code Status : Full  Family Communication:    (patient is alert, awake and coherent)   Consults  :  na  DVT Prophylaxis  :  Lovenox - - SCDs    Lab Results  Component Value Date   PLT 180 04/14/2020    Inpatient Medications  Scheduled Meds: . aspirin EC  325 mg Oral q morning - 10a  . dextromethorphan-guaiFENesin  1 tablet Oral BID  . enoxaparin (LOVENOX) injection  40 mg Subcutaneous Q24H  . guaiFENesin  600 mg Oral BID  . ipratropium-albuterol  3 mL Nebulization TID  . mometasone-formoterol  2 puff Inhalation BID  . omega-3 acid ethyl esters  1 g Oral  Daily   Continuous Infusions: . azithromycin 500 mg (04/15/20 0740)  . cefTRIAXone (ROCEPHIN)  IV 2 g (04/15/20 0748)   PRN Meds:.albuterol    Anti-infectives (From admission, onward)   Start     Dose/Rate Route Frequency Ordered Stop   04/15/20 0800  cefTRIAXone (ROCEPHIN) 2 g in sodium chloride 0.9 % 100 mL IVPB     Discontinue     2 g 200 mL/hr over 30 Minutes Intravenous Every 24 hours 04/14/20 2004 04/20/20 0759   04/15/20 0800  azithromycin (ZITHROMAX) 500 mg in sodium chloride 0.9 % 250 mL IVPB     Discontinue     500 mg 250 mL/hr over 60 Minutes Intravenous Every 24 hours 04/14/20 2004 04/20/20 0759   04/14/20 2000  azithromycin (ZITHROMAX) 500 mg in sodium chloride 0.9 % 250 mL IVPB        500  mg 250 mL/hr over 60 Minutes Intravenous  Once 04/14/20 1951 04/14/20 2338   04/14/20 1930  cefTRIAXone (ROCEPHIN) 1 g in sodium chloride 0.9 % 100 mL IVPB        1 g 200 mL/hr over 30 Minutes Intravenous  Once 04/14/20 1921 04/14/20 2109        Objective:   Vitals:   04/15/20 0354 04/15/20 0425 04/15/20 0832 04/15/20 1712  BP:  100/69    Pulse: (!) 116 97    Resp: (!) 22 20    Temp:  98.2 F (36.8 C)    TempSrc:      SpO2: 93% 93% 94% 95%  Weight:      Height:        Wt Readings from Last 3 Encounters:  04/14/20 99.8 kg  04/15/19 82.6 kg  03/25/19 81.6 kg     Intake/Output Summary (Last 24 hours) at 04/15/2020 1939 Last data filed at 04/15/2020 1700 Gross per 24 hour  Intake 1180 ml  Output 800 ml  Net 380 ml     Physical Exam  Gen:- Awake Alert,  DOE HEENT:- [place].AT, No sclera icterus Neck-Supple Neck,No JVD,.  Lungs-diminished breath sounds with rhonchi  CV- S1, S2 normal, regular  Abd-  +ve B.Sounds, Abd Soft, No tenderness,    Extremity/Skin:- No  edema, pedal pulses present  Psych-affect is appropriate, oriented x3 Neuro-generalized, no new focal deficits, no tremors   Data Review:   Micro Results Recent Results (from the past 240 hour(s))  SARS Coronavirus 2 by RT PCR (hospital order, performed in [facility] hospital lab) Nasopharyngeal Nasopharyngeal Swab     Status: None   Collection Time: 04/14/20  5:00 PM   Specimen: Nasopharyngeal Swab  Result Value Ref Range Status   SARS Coronavirus 2 NEGATIVE NEGATIVE Final    Comment: (NOTE) SARS-CoV-2 target nucleic acids are NOT DETECTED.  The SARS-CoV-2 RNA is generally detectable in upper and lower respiratory specimens during the acute phase of infection. The lowest concentration of SARS-CoV-2 viral copies this assay can detect is 250 copies / mL. A negative result does not preclude SARS-CoV-2 infection and should not be used as the sole basis for treatment or other patient management decisions.  A  negative result may occur with improper specimen collection / handling, submission of specimen other than nasopharyngeal swab, presence of viral mutation(s) within the areas targeted by this assay, and inadequate number of viral copies (<250 copies / mL). A negative result must be combined with clinical observations, patient history, and epidemiological information.  Fact Sheet for Patients:   StrictlyIdeas.no  Fact Sheet for  Healthcare Providers: BankingDealers.co.za  This test is not yet approved or  cleared by the [place] and has been authorized for detection and/or diagnosis of SARS-CoV-2 by FDA under an Emergency Use Authorization (EUA).  This EUA will remain in effect (meaning this test can be used) for the duration of the COVID-19 declaration under Section 564(b)(1) of the Act, 21 U.S.C. section 360bbb-3(b)(1), unless the authorization is terminated or revoked sooner.  Performed at [facility], [address] [place], [place] [address]     Radiology Reports CT Head Wo Contrast  Result Date: 04/14/2020 CLINICAL DATA:  79 year old female with head trauma. EXAM: CT HEAD WITHOUT CONTRAST TECHNIQUE: Contiguous axial images were obtained from the base of the skull through the vertex without intravenous contrast. COMPARISON:  Head CT dated 01/30/2019. FINDINGS: Evaluation of this exam is limited due to motion artifact. Brain: Mild age-related atrophy and chronic microvascular ischemic changes. There is no acute intracranial hemorrhage. No mass effect or midline shift no extra-axial fluid collection. Vascular: No hyperdense vessel or unexpected calcification. Skull: Normal. Negative for fracture or focal lesion. Sinuses/Orbits: No acute finding. Other: None IMPRESSION: 1. No acute intracranial pathology. 2. Mild age-related atrophy and chronic microvascular ischemic changes. Electronically Signed   By: Anner Crete M.D.   On:  04/14/2020 20:55   DG Chest Portable 1 View  Result Date: 04/14/2020 CLINICAL DATA:  Right hip and knee pain.  Fall. EXAM: PORTABLE CHEST 1 VIEW COMPARISON:  05/20/2017 FINDINGS: Cardiomegaly. Right upper lobe and patchy left perihilar and lower lobe airspace opacities concerning for pneumonia. No effusions. No acute bony abnormality. IMPRESSION: Patchy bilateral airspace opacities concerning for multifocal pneumonia. Electronically Signed   By: Rolm Baptise M.D.   On: 04/14/2020 18:35   DG Knee Complete 4 Views Right  Result Date: 04/14/2020 CLINICAL DATA:  Fall, right knee pain EXAM: RIGHT KNEE - COMPLETE 4+ VIEW COMPARISON:  None. FINDINGS: Moderate tricompartment degenerative changes with joint space narrowing and spurring. Small joint effusion. No acute bony abnormality. Specifically, no fracture, subluxation, or dislocation. Vascular calcifications noted. IMPRESSION: Moderate degenerative changes. Small joint effusion. No acute bony abnormality. Electronically Signed   By: Rolm Baptise M.D.   On: 04/14/2020 18:36   ECHOCARDIOGRAM COMPLETE  Result Date: 04/15/2020    ECHOCARDIOGRAM REPORT   Patient Name:   LASHUNDRA SHIVELEY [facility] Date of Exam: 04/15/2020 Medical Rec #:  [id number]       Height:       67.0 in Accession #:    [id number]      Weight:       220.0 lb Date of Birth:  1941-03-12       BSA:          2.106 m Patient Age:    23 years        BP:           100/69 mmHg Patient Gender: F               HR:           97 bpm. Exam Location:  [facility] Procedure: 2D Echo Indications:    Atrial Fibrillation 427.31 / I48.91  History:        Patient has prior history of Echocardiogram examinations, most                 recent 05/22/2017. COPD and Stroke, Arrythmias:Atrial                 Fibrillation, Signs/Symptoms:Syncope;  Risk Factors:Current                 Smoker. GERD.  Sonographer:    Leavy Cella RDCS (AE) Referring Phys: [id number] OLADAPO ADEFESO IMPRESSIONS  1. Left ventricular ejection fraction, by  estimation, is 60 to 65%. The left ventricle has normal function. The left ventricle has no regional wall motion abnormalities. There is mild left ventricular hypertrophy. Left ventricular diastolic parameters are indeterminate.  2. Right ventricular systolic function is moderately reduced. The right ventricular size is severely enlarged. There is moderately elevated pulmonary artery systolic pressure.  3. Left atrial size was severely dilated.  4. Right atrial size was severely dilated.  5. The mitral valve is normal in structure. No evidence of mitral valve regurgitation. No evidence of mitral stenosis.  6. The aortic valve is tricuspid. Aortic valve regurgitation is not visualized. No aortic stenosis is present.  7. The inferior vena cava is dilated in size with <50% respiratory variability, suggesting right atrial pressure of 15 mmHg. FINDINGS  Left Ventricle: Left ventricular ejection fraction, by estimation, is 60 to 65%. The left ventricle has normal function. The left ventricle has no regional wall motion abnormalities. The left ventricular internal cavity size was normal in size. There is  mild left ventricular hypertrophy. Left ventricular diastolic parameters are indeterminate. Right Ventricle: The right ventricular size is severely enlarged. Right vetricular wall thickness was not assessed. Right ventricular systolic function is moderately reduced. There is moderately elevated pulmonary artery systolic pressure. The tricuspid regurgitant velocity is 3.19 m/s, and with an assumed right atrial pressure of 10 mmHg, the estimated right ventricular systolic pressure is 44.0 mmHg. Left Atrium: Left atrial size was severely dilated. Right Atrium: Right atrial size was severely dilated. Pericardium: There is no evidence of pericardial effusion. Mitral Valve: The mitral valve is normal in structure. There is mild thickening of the mitral valve leaflet(s). There is mild calcification of the mitral valve  leaflet(s). Mild mitral annular calcification. No evidence of mitral valve regurgitation. No evidence of mitral valve stenosis. Tricuspid Valve: The tricuspid valve is normal in structure. Tricuspid valve regurgitation is mild . No evidence of tricuspid stenosis. Aortic Valve: The aortic valve is tricuspid. . There is mild thickening and mild calcification of the aortic valve. Aortic valve regurgitation is not visualized. No aortic stenosis is present. Mild aortic valve annular calcification. There is mild thickening of the aortic valve. There is mild calcification of the aortic valve. Aortic valve mean gradient measures 3.4 mmHg. Aortic valve peak gradient measures 6.2 mmHg. Aortic valve area, by VTI measures 2.36 cm. Pulmonic Valve: The pulmonic valve was not well visualized. Pulmonic valve regurgitation is not visualized. No evidence of pulmonic stenosis. Aorta: The aortic root is normal in size and structure. Pulmonary Artery: Moderate pulmonary HTN, PASP 56 mmHg. Venous: The inferior vena cava is dilated in size with less than 50% respiratory variability, suggesting right atrial pressure of 15 mmHg. IAS/Shunts: No atrial level shunt detected by color flow Doppler.  LEFT VENTRICLE PLAX 2D LVIDd:         4.24 cm  Diastology LVIDs:         2.38 cm  LV e' lateral:   10.20 cm/s LV PW:         1.25 cm  LV E/e' lateral: 11.4 LV IVS:        1.27 cm  LV e' medial:    8.25 cm/s LVOT diam:     2.20 cm  LV E/e' medial:  14.1 LV SV:         52 LV SV Index:   25 LVOT Area:     3.80 cm  RIGHT VENTRICLE RV S prime:     9.49 cm/s TAPSE (M-mode): 1.8 cm LEFT ATRIUM             Index       RIGHT ATRIUM           Index LA diam:        5.10 cm 2.42 cm/m  RA Area:     34.10 cm LA Vol (A2C):   68.4 ml 32.48 ml/m RA Volume:   137.00 ml 65.06 ml/m LA Vol (A4C):   90.8 ml 43.12 ml/m LA Biplane Vol: 81.2 ml 38.56 ml/m  AORTIC VALVE AV Area (Vmax):    2.41 cm AV Area (Vmean):   1.91 cm AV Area (VTI):     2.36 cm AV Vmax:            124.88 cm/s AV Vmean:          87.334 cm/s AV VTI:            0.221 m AV Peak Grad:      6.2 mmHg AV Mean Grad:      3.4 mmHg LVOT Vmax:         79.27 cm/s LVOT Vmean:        43.795 cm/s LVOT VTI:          0.137 m LVOT/AV VTI ratio: 0.62  AORTA Ao Root diam: 2.80 cm MITRAL VALVE                TRICUSPID VALVE MV Area (PHT): 4.36 cm     TR Peak grad:   40.7 mmHg MV Decel Time: 174 msec     TR Vmax:        319.00 cm/s MV E velocity: 116.00 cm/s                             SHUNTS                             Systemic VTI:  0.14 m                             Systemic Diam: 2.20 cm Carlyle Dolly MD Electronically signed by Carlyle Dolly MD Signature Date/Time: 04/15/2020/3:51:48 PM    Final    DG HIP UNILAT W OR W/O PELVIS 2-3 VIEWS RIGHT  Result Date: 04/14/2020 CLINICAL DATA:  Fall, right hip pain EXAM: DG HIP (WITH OR WITHOUT PELVIS) 2-3V RIGHT COMPARISON:  None. FINDINGS: Mild symmetric degenerative changes in the hips. Postoperative changes in the visualized lower lumbar spine. SI joints symmetric and unremarkable. No acute bony abnormality. Specifically, no fracture, subluxation, or dislocation. IMPRESSION: No acute bony abnormality. Electronically Signed   By: Rolm Baptise M.D.   On: 04/14/2020 18:35     CBC Recent Labs  Lab 04/14/20 1733  WBC 20.8*  HGB 12.6  HCT 41.9  PLT 180  MCV 98.8  MCH 29.7  MCHC 30.1  RDW 14.3  LYMPHSABS 0.7  MONOABS 0.9  EOSABS 0.0  BASOSABS 0.1    Chemistries  Recent Labs  Lab 04/14/20 1733  NA 137  K 3.8  CL 100  CO2 27  GLUCOSE 124*  BUN  18  CREATININE 0.73  CALCIUM 8.3*   ------------------------------------------------------------------------------------------------------------------ No results for input(s): CHOL, HDL, LDLCALC, TRIG, CHOLHDL, LDLDIRECT in the last 72 hours.  Lab Results  Component Value Date   HGBA1C 5.8 (H) 07/31/2013    ------------------------------------------------------------------------------------------------------------------ No results for input(s): TSH, T4TOTAL, T3FREE, THYROIDAB in the last 72 hours.  Invalid input(s): FREET3 ------------------------------------------------------------------------------------------------------------------ No results for input(s): VITAMINB12, FOLATE, FERRITIN, TIBC, IRON, RETICCTPCT in the last 72 hours.  Coagulation profile No results for input(s): INR, PROTIME in the last 168 hours.  No results for input(s): DDIMER in the last 72 hours.  Cardiac Enzymes No results for input(s): CKMB, TROPONINI, MYOGLOBIN in the last 168 hours.  Invalid input(s): CK ------------------------------------------------------------------------------------------------------------------ No results found for: BNP   Roxan Hockey M.D on 04/15/2020 at 7:39 PM  Go to [email or web address] - for contact info  [facility] - Office  [phone]

## 2020-04-15 NOTE — ED Notes (Addendum)
ED TO INPATIENT HANDOFF REPORT  ED Nurse Name and Phone #:  Fabio Neighbors RN  S Name/Age/Gender Meagan Holt 79 y.o. female Room/Bed: APA08/APA08  Code Status   Code Status: Full Code  Home/SNF/Other Home Patient oriented to: self, place, time and situation Is this baseline? Yes   Triage Complete: Triage complete  Chief Complaint CAP (community acquired pneumonia) [J18.9]  Triage Note Patient c/o a fall today at 0600 when her right leg gave out and she heard a pop. Pt states she was lying on the floor until 1600 this afternoon when a friend found her. Pt is now complaining of lower back pain and right hip pain. Pt has multiple skin tear on both upper extremities.    Allergies Allergies  Allergen Reactions  . Latex Itching and Swelling  . Doxycycline Rash    Level of Care/Admitting Diagnosis ED Disposition    ED Disposition Condition [facility] Area: [facility] [[id number]]  Level of Care: Med-Surg [16]  Covid Evaluation: Confirmed COVID Negative  Diagnosis: CAP (community acquired pneumonia) [045409]  Admitting Physician: [name] [[id number]]  Attending Physician: [name] [[id number]]  Estimated length of stay: past midnight tomorrow  Certification:: I certify this patient will need inpatient services for at least 2 midnights       B Medical/Surgery History Past Medical History:  Diagnosis Date  . Arthritis    "all my joints" (07/30/2013)  . Chronic back pain   . Chronic bronchitis ([place])    "get it q year" (07/30/2013)  . COPD (chronic obstructive pulmonary disease) ([place])   . Depression   . Falls frequently    "fell twice in the last 2 days" (07/30/2013)  . GERD (gastroesophageal reflux disease)    "rarely" (07/30/2013)  . H/O hiatal hernia   . History of blood transfusion   . Kidney stones   . Stroke [facility])    Past Surgical History:  Procedure Laterality Date  . APPENDECTOMY    . BACK SURGERY     "put cages in  mid back; did arthroscopic OR on lower back" (11/326/2014)  . BILATERAL OOPHORECTOMY Bilateral   . CATARACT EXTRACTION W/ INTRAOCULAR LENS  IMPLANT, BILATERAL Bilateral   . CHOLECYSTECTOMY    . EXCISIONAL HEMORRHOIDECTOMY    . GASTRIC BYPASS    . HERNIA REPAIR     "removed my belly button too" (07/30/2013)  . KIDNEY STONE SURGERY     "twice" (07/30/2013)  . KNEE ARTHROSCOPY Left   . TONSILLECTOMY       A IV Location/Drains/Wounds Patient Lines/Drains/Airways Status    Active Line/Drains/Airways    Name Placement date Placement time Site Days   Peripheral IV 04/14/20 Right Antecubital 04/14/20  2025  Antecubital  1          Intake/Output Last 24 hours  Intake/Output Summary (Last 24 hours) at 04/15/2020 0343 Last data filed at 04/14/2020 2338 Gross per 24 hour  Intake 340 ml  Output --  Net 340 ml    Labs/Imaging Results for orders placed or performed during the hospital encounter of 04/14/20 (from the past 48 hour(s))  SARS Coronavirus 2 by RT PCR (hospital order, performed in [facility] hospital lab) Nasopharyngeal Nasopharyngeal Swab     Status: None   Collection Time: 04/14/20  5:00 PM   Specimen: Nasopharyngeal Swab  Result Value Ref Range   SARS Coronavirus 2 NEGATIVE NEGATIVE    Comment: (NOTE) SARS-CoV-2 target nucleic acids are NOT DETECTED.  The  SARS-CoV-2 RNA is generally detectable in upper and lower respiratory specimens during the acute phase of infection. The lowest concentration of SARS-CoV-2 viral copies this assay can detect is 250 copies / mL. A negative result does not preclude SARS-CoV-2 infection and should not be used as the sole basis for treatment or other patient management decisions.  A negative result may occur with improper specimen collection / handling, submission of specimen other than nasopharyngeal swab, presence of viral mutation(s) within the areas targeted by this assay, and inadequate number of viral copies (<250 copies / mL).  A negative result must be combined with clinical observations, patient history, and epidemiological information.  Fact Sheet for Patients:   StrictlyIdeas.no  Fact Sheet for Healthcare Providers: BankingDealers.co.za  This test is not yet approved or  cleared by the [place] FDA and has been authorized for detection and/or diagnosis of SARS-CoV-2 by FDA under an Emergency Use Authorization (EUA).  This EUA will remain in effect (meaning this test can be used) for the duration of the COVID-19 declaration under Section 564(b)(1) of the Act, 21 U.S.C. section 360bbb-3(b)(1), unless the authorization is terminated or revoked sooner.  Performed at [facility], [address], [place], [place] [address]   Urinalysis, Routine w reflex microscopic Urine, Catheterized     Status: Abnormal   Collection Time: 04/14/20  5:02 PM  Result Value Ref Range   Color, Urine YELLOW YELLOW   APPearance CLEAR CLEAR   Specific Gravity, Urine 1.017 1.005 - 1.030   pH 5.0 5.0 - 8.0   Glucose, UA NEGATIVE NEGATIVE mg/dL   Hgb urine dipstick MODERATE (A) NEGATIVE   Bilirubin Urine NEGATIVE NEGATIVE   Ketones, ur NEGATIVE NEGATIVE mg/dL   Protein, ur 30 (A) NEGATIVE mg/dL   Nitrite NEGATIVE NEGATIVE   Leukocytes,Ua NEGATIVE NEGATIVE   RBC / HPF 6-10 0 - 5 RBC/hpf   WBC, UA 0-5 0 - 5 WBC/hpf   Bacteria, UA RARE (A) NONE SEEN   Squamous Epithelial / LPF 0-5 0 - 5   Mucus PRESENT     Comment: Performed at [facility], [address], [place], [place] [address]  POC CBG, ED     Status: Abnormal   Collection Time: 04/14/20  5:06 PM  Result Value Ref Range   Glucose-Capillary 136 (H) 70 - 99 mg/dL    Comment: Glucose reference range applies only to samples taken after fasting for at least 8 hours.  CBC with Differential/Platelet     Status: Abnormal   Collection Time: 04/14/20  5:33 PM  Result Value Ref Range   WBC 20.8 (H) 4.0 - 10.5 K/uL   RBC 4.24  3.87 - 5.11 MIL/uL   Hemoglobin 12.6 12.0 - 15.0 g/dL   HCT 41.9 36 - 46 %   MCV 98.8 80.0 - 100.0 fL   MCH 29.7 26.0 - 34.0 pg   MCHC 30.1 30.0 - 36.0 g/dL   RDW 14.3 11.5 - 15.5 %   Platelets 180 150 - 400 K/uL   nRBC 0.0 0.0 - 0.2 %   Neutrophils Relative % 92 %   Neutro Abs 18.9 (H) 1.7 - 7.7 K/uL   Lymphocytes Relative 3 %   Lymphs Abs 0.7 0.7 - 4.0 K/uL   Monocytes Relative 4 %   Monocytes Absolute 0.9 0 - 1 K/uL   Eosinophils Relative 0 %   Eosinophils Absolute 0.0 0 - 0 K/uL   Basophils Relative 0 %   Basophils Absolute 0.1 0 - 0  K/uL   Immature Granulocytes 1 %   Abs Immature Granulocytes 0.19 (H) 0.00 - 0.07 K/uL    Comment: Performed at [facility], [address], [place], [place] [address]  Basic metabolic panel     Status: Abnormal   Collection Time: 04/14/20  5:33 PM  Result Value Ref Range   Sodium 137 135 - 145 mmol/L   Potassium 3.8 3.5 - 5.1 mmol/L   Chloride 100 98 - 111 mmol/L   CO2 27 22 - 32 mmol/L   Glucose, Bld 124 (H) 70 - 99 mg/dL    Comment: Glucose reference range applies only to samples taken after fasting for at least 8 hours.   BUN 18 8 - 23 mg/dL   Creatinine, Ser 0.73 0.44 - 1.00 mg/dL   Calcium 8.3 (L) 8.9 - 10.3 mg/dL   GFR calc non Af Amer >60 >60 mL/min   GFR calc Af Amer >60 >60 mL/min   Anion gap 10 5 - 15    Comment: Performed at [facility], [address] [place], [place] [address]  CK     Status: None   Collection Time: 04/14/20  5:33 PM  Result Value Ref Range   Total CK 129 38.0 - 234.0 U/L    Comment: Performed at [facility], [address] [place], [place] [address]  Procalcitonin - Baseline     Status: None   Collection Time: 04/15/20 12:45 AM  Result Value Ref Range   Procalcitonin 0.58 ng/mL    Comment:        Interpretation: PCT > 0.5 ng/mL and <= 2 ng/mL: Systemic infection (sepsis) is possible, but other conditions are known to elevate PCT as well. (NOTE)       Sepsis PCT Algorithm           Lower  Respiratory Tract                                      Infection PCT Algorithm    ----------------------------     ----------------------------         PCT < 0.25 ng/mL                PCT < 0.10 ng/mL          Strongly encourage             Strongly discourage   discontinuation of antibiotics    initiation of antibiotics    ----------------------------     -----------------------------       PCT 0.25 - 0.50 ng/mL            PCT 0.10 - 0.25 ng/mL               OR       >80% decrease in PCT            Discourage initiation of                                            antibiotics      Encourage discontinuation           of antibiotics    ----------------------------     -----------------------------         PCT >= 0.50 ng/mL  PCT 0.26 - 0.50 ng/mL                AND       <80% decrease in PCT             Encourage initiation of                                             antibiotics       Encourage continuation           of antibiotics    ----------------------------     -----------------------------        PCT >= 0.50 ng/mL                  PCT > 0.50 ng/mL               AND         increase in PCT                  Strongly encourage                                      initiation of antibiotics    Strongly encourage escalation           of antibiotics                                     -----------------------------                                           PCT <= 0.25 ng/mL                                                 OR                                        > 80% decrease in PCT                                      Discontinue / Do not initiate                                             antibiotics  Performed at [facility], [address], [place], [place] [address]    CT Head Wo Contrast  Result Date: 04/14/2020 CLINICAL DATA:  79 year old female with head trauma. EXAM: CT HEAD WITHOUT CONTRAST TECHNIQUE: Contiguous axial images were obtained from the  base of the skull through the vertex without intravenous contrast. COMPARISON:  Head CT dated 01/30/2019. FINDINGS: Evaluation of this exam is limited due to motion artifact. Brain: Mild age-related atrophy and chronic microvascular ischemic changes. There  is no acute intracranial hemorrhage. No mass effect or midline shift no extra-axial fluid collection. Vascular: No hyperdense vessel or unexpected calcification. Skull: Normal. Negative for fracture or focal lesion. Sinuses/Orbits: No acute finding. Other: None IMPRESSION: 1. No acute intracranial pathology. 2. Mild age-related atrophy and chronic microvascular ischemic changes. Electronically Signed   By: Anner Crete M.D.   On: 04/14/2020 20:55   DG Chest Portable 1 View  Result Date: 04/14/2020 CLINICAL DATA:  Right hip and knee pain.  Fall. EXAM: PORTABLE CHEST 1 VIEW COMPARISON:  05/20/2017 FINDINGS: Cardiomegaly. Right upper lobe and patchy left perihilar and lower lobe airspace opacities concerning for pneumonia. No effusions. No acute bony abnormality. IMPRESSION: Patchy bilateral airspace opacities concerning for multifocal pneumonia. Electronically Signed   By: Rolm Baptise M.D.   On: 04/14/2020 18:35   DG Knee Complete 4 Views Right  Result Date: 04/14/2020 CLINICAL DATA:  Fall, right knee pain EXAM: RIGHT KNEE - COMPLETE 4+ VIEW COMPARISON:  None. FINDINGS: Moderate tricompartment degenerative changes with joint space narrowing and spurring. Small joint effusion. No acute bony abnormality. Specifically, no fracture, subluxation, or dislocation. Vascular calcifications noted. IMPRESSION: Moderate degenerative changes. Small joint effusion. No acute bony abnormality. Electronically Signed   By: Rolm Baptise M.D.   On: 04/14/2020 18:36   DG HIP UNILAT W OR W/O PELVIS 2-3 VIEWS RIGHT  Result Date: 04/14/2020 CLINICAL DATA:  Fall, right hip pain EXAM: DG HIP (WITH OR WITHOUT PELVIS) 2-3V RIGHT COMPARISON:  None. FINDINGS: Mild symmetric  degenerative changes in the hips. Postoperative changes in the visualized lower lumbar spine. SI joints symmetric and unremarkable. No acute bony abnormality. Specifically, no fracture, subluxation, or dislocation. IMPRESSION: No acute bony abnormality. Electronically Signed   By: Rolm Baptise M.D.   On: 04/14/2020 18:35    Pending Labs Unresulted Labs (From admission, onward) Comment          Start     Ordered   04/15/20 0014  Culture, blood (Routine X 2) w Reflex to ID Panel  BLOOD CULTURE X 2,   R (with STAT occurrences)      04/15/20 0014   04/15/20 0014  Expectorated sputum assessment w rflx to resp cult  Once,   STAT        04/15/20 0014   04/14/20 1959  Legionella Pneumophila Serogp 1 Ur Ag  Once,   STAT        04/14/20 2004   04/14/20 1959  Strep pneumoniae urinary antigen  Once,   STAT        04/14/20 2004   04/14/20 1954  HIV Antibody (routine testing w rflx)  (HIV Antibody (Routine testing w reflex) panel)  Once,   STAT        04/14/20 2004          Vitals/Pain Today's Vitals   04/14/20 2130 04/15/20 0108 04/15/20 0130 04/15/20 0200  BP: 108/70 124/79 (!) 107/36 (!) 121/45  Pulse: 90 (!) 105 93   Resp: 20 (!) 24 (!) 23 (!) 28  Temp:      TempSrc:      SpO2: 92% 90% 94%   Weight:      Height:      PainSc:        Isolation Precautions No active isolations  Medications Medications  cefTRIAXone (ROCEPHIN) 2 g in sodium chloride 0.9 % 100 mL IVPB (has no administration in time range)  azithromycin (ZITHROMAX) 500 mg in sodium chloride 0.9 % 250 mL IVPB (has  no administration in time range)  dextromethorphan-guaiFENesin (MUCINEX DM) 30-600 MG per 12 hr tablet 1 tablet (1 tablet Oral Not Given 04/15/20 0015)  enoxaparin (LOVENOX) injection 100 mg (100 mg Subcutaneous Not Given 04/15/20 0257)  neomycin-bacitracin-polymyxin (NEOSPORIN) ointment packet (1 application Topical Given 04/14/20 2027)  cefTRIAXone (ROCEPHIN) 1 g in sodium chloride 0.9 % 100 mL IVPB (0 g  Intravenous Stopped 04/14/20 2109)  azithromycin (ZITHROMAX) 500 mg in sodium chloride 0.9 % 250 mL IVPB (0 mg Intravenous Stopped 04/14/20 2338)    Mobility walks with device High fall risk   Focused Assessments    R Recommendations: See Admitting Provider Note  Report given to:  Roselyn Reef RN [phone]  Additional Notes:

## 2020-04-15 NOTE — Plan of Care (Addendum)
  Problem: Acute Rehab PT Goals(only PT should resolve) Goal: Pt Will Go Supine/Side To Sit Outcome: Progressing Flowsheets (Taken 04/15/2020 1008) Pt will go Supine/Side to Sit: . with supervision . with modified independence Goal: Patient Will Transfer Sit To/From Stand Outcome: Progressing Flowsheets (Taken 04/15/2020 1008) Patient will transfer sit to/from stand: with minimal assist Goal: Pt Will Transfer Bed To Chair/Chair To Bed Outcome: Progressing Flowsheets (Taken 04/15/2020 1008) Pt will Transfer Bed to Chair/Chair to Bed: with min assist Goal: Pt Will Ambulate Outcome: Progressing Flowsheets (Taken 04/15/2020 1008) Pt will Ambulate: . 10 feet . with moderate assist . with rolling walker  10:10 AM , 04/15/20 Karlyn Agee, SPT Physical Therapy with [facility] [phone] office  During this treatment session, the therapist was present, participating in and directing the treatment.  2:47 PM, 04/15/20 Lonell Grandchild, MPT Physical Therapist with [facility] [phone] office [phone] mobile phone

## 2020-04-15 NOTE — Plan of Care (Signed)
  Problem: Acute Rehab OT Goals (only OT should resolve) Goal: Pt. Will Perform Grooming Flowsheets (Taken 04/15/2020 1022) Pt Will Perform Grooming:  with set-up  sitting Goal: Pt. Will Perform Upper Body Dressing Flowsheets (Taken 04/15/2020 1022) Pt Will Perform Upper Body Dressing:  with set-up  sitting Goal: Pt. Will Perform Lower Body Dressing Flowsheets (Taken 04/15/2020 1022) Pt Will Perform Lower Body Dressing:  with set-up  sitting/lateral leans  sit to/from stand Goal: Pt. Will Transfer To Toilet Flowsheets (Taken 04/15/2020 1022) Pt Will Transfer to Toilet:  with supervision  ambulating  regular height toilet  bedside commode Goal: Pt. Will Perform Toileting-Clothing Manipulation Flowsheets (Taken 04/15/2020 1022) Pt Will Perform Toileting - Clothing Manipulation and hygiene:  with min guard assist  sitting/lateral leans  sit to/from stand Goal: Pt/Caregiver Will Perform Home Exercise Program Flowsheets (Taken 04/15/2020 1022) Pt/caregiver will Perform Home Exercise Program:  Increased strength  Both right and left upper extremity  With Supervision

## 2020-04-15 NOTE — Evaluation (Addendum)
Physical Therapy Evaluation Patient Details Name: Meagan Holt MRN: [id number] DOB: Mar 12, 1941 Today's Date: 04/15/2020   History of Present Illness  Meagan Holt is a 79 y.o. female with medical history significant for arthritis, chronic back pain, COPD on supplemental oxygen via  at 2 LPM at night, CVA and frequent mechanical falls who presents to the emergency department via EMS due to fall sustained at home.  Patient states that she sustained a fall this morning around 6:30 AM from a standing position and landed on a carpet at her home, she had difficulty in being able to get up, so patient was on the floor until her neighbor who walks her dog checked on her in the afternoon and activated EMS.  Patient states that her legs gave up on her while ambulating with a walker.  She complained of right hip, knee pain and multiple abrasions on her arms.  Patient endorsed several days of productive cough (she was unable to tell me the color of sputum due to vision problems), she was not sure if she had any fever.  Patient continues to smoke cigarettes and denies increased shortness of breath.    Clinical Impression  The patient had LE weakness and significant pain in the RLE during supine to sit transfer. She required excessive VC's in order to engage UE to self assist in the transfer. The patient had increased RLE pain while laterally scooting in bed and required VC's for proper use of walker during stand pivot transfer. The patient was unsure where to place hands in order to grab the walker handles, possibly due to almost complete blindness. The patient was able to take only a few steps in order to transfer to the chair, but with increase in RLE pain. Patient tolerated sitting up in chair with neighbor present at bedside after therapy. PLAN: The patient will continue to benefit from skilled physical therapy services in hospital at recommended venue below in order to improve balance, gait, and ADL's to  promote independence in functional activities.      Follow Up Recommendations Supervision/Assistance - 24 hour;Supervision for mobility/OOB;SNF    Equipment Recommendations  None recommended by PT None recommended by PT.    Recommendations for Other Services   None recommended by PT.     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 Restrictions Weight Bearing Restrictions: No      Mobility  Bed Mobility Overal bed mobility: Needs Assistance Bed Mobility: Supine to Sit     Supine to sit: Min assist     General bed mobility comments: cuing for pushing up on RUE  Transfers Overall transfer level: Needs assistance Equipment used: Rolling walker (2 wheeled) Transfers: Sit to/from [facility];Lateral/Scoot Transfers Sit to Stand: Mod assist Stand pivot transfers: Mod assist      Lateral/Scoot Transfers: Min assist General transfer comment: pt hesitant in scooting transfer, VC's to engage UE's to assist w the task  Ambulation/Gait Ambulation/Gait assistance: Mod assist   Assistive device: Rolling walker (2 wheeled) Gait Pattern/deviations: Step-to pattern;Decreased step length - right;Decreased step length - left;Antalgic        Stairs            Wheelchair Mobility    Modified Rankin (Stroke Patients Only)       Balance Overall balance assessment: Needs assistance Sitting-balance support: Single extremity supported;Feet supported Sitting balance-Leahy Scale: Good   Postural control: Left lateral lean  Standing balance-Leahy Scale: Poor                               Pertinent Vitals/Pain Pain Assessment: Faces Faces Pain Scale: Hurts even more Pain Location: RLE Pain Descriptors / Indicators: Grimacing;Moaning;Sore;Sharp    Home Living Family/patient expects to be discharged to:: Private  residence Living Arrangements: Alone Available Help at Discharge: Neighbor;Available PRN/intermittently Type of Home: Apartment Home Access: Level entry Entrance Stairs-Rails: None Entrance Stairs-Number of Steps: 2 Home Layout: One level Home Equipment: Walker - 4 wheels;Shower seat;Grab bars - tub/shower;Cane - quad Additional Comments: pt slightly confused    Prior Function Level of Independence: Independent with assistive device(s)   Gait / Transfers Assistance Needed: pt uses RW or quad cane for mobility  ADL's / Homemaking Assistance Needed: neighbors assist with meal preparation and occasional ADLs        Hand Dominance   Dominant Hand: Right    Extremity/Trunk Assessment   Upper Extremity Assessment Upper Extremity Assessment: Defer to OT evaluation    Lower Extremity Assessment Lower Extremity Assessment: Generalized weakness    Cervical / Trunk Assessment Cervical / Trunk Assessment: Normal  Communication   Communication: No difficulties  Cognition Arousal/Alertness: Awake/alert Behavior During Therapy: WFL for tasks assessed/performed Overall Cognitive Status: History of cognitive impairments - at baseline                                 General Comments: mild confusion      General Comments      Exercises     Assessment/Plan    PT Assessment Patient needs continued PT services  PT Problem List Decreased strength;Decreased mobility;Decreased range of motion;Decreased activity tolerance;Decreased balance;Decreased cognition;Decreased knowledge of use of DME;Cardiopulmonary status limiting activity;Pain       PT Treatment Interventions DME instruction;Functional mobility training;Therapeutic activities;Patient/family education;Modalities;Balance training;Therapeutic exercise;Manual techniques    PT Goals (Current goals can be found in the Care Plan section)  Acute Rehab PT Goals Patient Stated Goal: go home w 24 hour assist PT  Goal Formulation: With patient Time For Goal Achievement: 04/29/20 Potential to Achieve Goals: Good    Frequency Min 3X/week   Barriers to discharge        Co-evaluation PT/OT/SLP Co-Evaluation/Treatment: Yes Reason for Co-Treatment: Complexity of the patient's impairments (multi-system involvement);For patient/therapist safety;To address functional/ADL transfers PT goals addressed during session: Mobility/safety with mobility;Balance         AM-PAC PT "6 Clicks" Mobility  Outcome Measure Help needed turning from your back to your side while in a flat bed without using bedrails?: A Little Help needed moving from lying on your back to sitting on the side of a flat bed without using bedrails?: A Lot Help needed moving to and from a bed to a chair (including a wheelchair)?: A Lot Help needed standing up from a chair using your arms (e.g., wheelchair or bedside chair)?: A Lot Help needed to walk in hospital room?: A Lot Help needed climbing 3-5 steps with a railing? : Total 6 Click Score: 12    End of Session Equipment Utilized During Treatment: Gait belt Activity Tolerance: Patient limited by pain;Treatment limited secondary to agitation;Patient limited by fatigue Patient left: in chair;with call bell/phone within reach;with family/visitor present Nurse Communication: Mobility status;Weight bearing status;Precautions PT Visit Diagnosis: Unsteadiness on feet (R26.81);Other abnormalities of gait and mobility (R26.89);Repeated falls (R29.6);Muscle weakness (  generalized) (M62.81);History of falling (Z91.81);Pain Pain - Right/Left: Right Pain - part of body: Leg    Time: 0825-0838 PT Time Calculation (min) (ACUTE ONLY): 13 min   Charges:   PT Evaluation $PT Eval Low Complexity: 1 Low PT Treatments $Therapeutic Activity: 8-22 mins        2:46 PM , 04/15/20 Karlyn Agee, SPT Physical Therapy with [facility] [phone] office  During this treatment  session, the therapist was present, participating in and directing the treatment.  2:46 PM, 04/15/20 Lonell Grandchild, MPT Physical Therapist with [facility] [phone] office [phone] mobile phone

## 2020-04-16 LAB — CBC
HCT: 36.3 % (ref 36.0–46.0)
Hemoglobin: 11 g/dL — ABNORMAL LOW (ref 12.0–15.0)
MCH: 29.6 pg (ref 26.0–34.0)
MCHC: 30.3 g/dL (ref 30.0–36.0)
MCV: 97.8 fL (ref 80.0–100.0)
Platelets: 166 10*3/uL (ref 150–400)
RBC: 3.71 MIL/uL — ABNORMAL LOW (ref 3.87–5.11)
RDW: 14.2 % (ref 11.5–15.5)
WBC: 13.6 10*3/uL — ABNORMAL HIGH (ref 4.0–10.5)
nRBC: 0 % (ref 0.0–0.2)

## 2020-04-16 LAB — BASIC METABOLIC PANEL
Anion gap: 11 (ref 5–15)
BUN: 15 mg/dL (ref 8–23)
CO2: 26 mmol/L (ref 22–32)
Calcium: 8.7 mg/dL — ABNORMAL LOW (ref 8.9–10.3)
Chloride: 101 mmol/L (ref 98–111)
Creatinine, Ser: 0.56 mg/dL (ref 0.44–1.00)
GFR calc Af Amer: >60 mL/min (ref >60)
GFR calc non Af Amer: >60 mL/min (ref >60)
Glucose, Bld: 100 mg/dL — ABNORMAL HIGH (ref 70–99)
Potassium: 3.5 mmol/L (ref 3.5–5.1)
Sodium: 138 mmol/L (ref 135–145)

## 2020-04-16 LAB — LEGIONELLA PNEUMOPHILA SEROGP 1 UR AG: L. pneumophila Serogp 1 Ur Ag: NEGATIVE

## 2020-04-16 NOTE — TOC Progression Note (Signed)
Transition of Care [facility]) - Progression Note    Patient Details  Name: Meagan Holt MRN: [id number] Date of Birth: [birth year]  Transition [facility]) CM/SW Contact  Shade Flood, LCSW Phone Number: 04/16/2020, 4:33 PM  Clinical Narrative:     Pt has selected [facility]. They can take her on Monday. Updated pt and MD. TOC will follow.  Expected Discharge Plan: [place] Barriers to Discharge: Continued Medical Work up, Ship broker, SNF Pending bed offer  Expected Discharge Plan and Services Expected Discharge Plan: [place] In-house Referral: Clinical Social Work   Post Acute Care Choice: [place] Living arrangements for the past 2 months: Apartment                                       Social Determinants of Health (SDOH) Interventions    Readmission Risk Interventions Readmission Risk Prevention Plan 04/16/2020  Medication Screening Complete  Transportation Screening Complete  Some recent data might be hidden

## 2020-04-16 NOTE — Progress Notes (Signed)
Patient Demographics:    Meagan Holt, is a 79 y.o. female, DOB - December 05, 1940, DJT:[id number]  Admit date - 04/14/2020   Admitting Physician [name], DO  Outpatient Primary MD for the patient is [name], MD  LOS - 2   Chief Complaint  Patient presents with  . Fall        Subjective:    Meagan Holt today has no fevers, no emesis,  No chest pain,    cough and shortness of breath improving, -Dyspnea with minimal activity and hypoxia persist  Assessment  & Plan :    Principal Problem:   CAP (community acquired pneumonia) Active Problems:   COPD (chronic obstructive pulmonary disease) (HCC)   History of stroke   Acute on chronic respiratory failure with hypoxia (HCC)   Recurrent falls   Atrial fibrillation with RVR (HCC)   Leukocytosis   Obesity (BMI 30.0-34.9)  Brief Summary:- 79 y.o. female with medical history significant for arthritis, chronic back pain, COPD on supplemental oxygen via [place] at 2 LPM at night, CVA and frequent mechanical falls admitted on 04/14/2020 after mechanical fall at home and found to have bilateral pneumonia with acute hypoxic respiratory failure  A/p 1)Bilateral Pneumonia---POA-- ---cough and shortness of breath improving  --Dyspnea with minimal activity and hypoxia persist --c/n iv  Rocephin and azithromycin, mucolytics, bronchodilators and supplemental oxygen as needed  2)PAFib with RVR--- Echo with EF of 60 to 65%, -CHA2DS2-VASc score 5, annual stroke risk 7.2 -however patient's  HAS BLED score is 2 and history of recurrent falls -Risk versus benefits of FULL anticoagulation discussed with patient and her friend -They have decided not to do for anticoagulation at this time  3)Ambulatory dysfunction/back pain/recurrent falls----PTA patient lived alone, does not have caregivers on 24/7 supervision,  --PT/OT recommends SNF rehab  4) history of  prior stroke--aspirin and Lipitor as ordered  5)COPD--- able, continue bronchodilators as above #1  6) acute hypoxic respiratory failure--- secondary to #1 above --Try to wean off O2  Disposition/Need for in-Hospital Stay- patient unable to be discharged at this time due to --- pneumonia with hypoxia requiring IV antibiotics and supplemental oxygen*  Status is: Inpatient  Remains inpatient appropriate because:- pneumonia requiring IV antibiotics and supplemental oxygen*   Disposition: The patient is from: Home              Anticipated d/c is to: SNF              Anticipated d/c date is: 1 day              Patient currently is not medically stable to d/c. Barriers: Not Clinically Stable- -- pneumonia requiring IV antibiotics and supplemental oxygen*  Code Status : Full  Family Communication:    (patient is alert, awake and coherent)   Consults  :  na  DVT Prophylaxis  :  Lovenox - - SCDs    Lab Results  Component Value Date   PLT 166 04/16/2020   Inpatient Medications  Scheduled Meds: . aspirin EC  325 mg Oral q morning - 10a  . atorvastatin  20 mg Oral Daily  . dextromethorphan-guaiFENesin  1 tablet Oral BID  . enoxaparin (LOVENOX) injection  40 mg Subcutaneous Q24H  .  guaiFENesin  600 mg Oral BID  . ipratropium-albuterol  3 mL Nebulization TID  . mometasone-formoterol  2 puff Inhalation BID  . omega-3 acid ethyl esters  1 g Oral Daily   Continuous Infusions: . azithromycin 500 mg (04/16/20 1010)  . cefTRIAXone (ROCEPHIN)  IV 2 g (04/16/20 1206)   PRN Meds:.albuterol  Anti-infectives (From admission, onward)   Start     Dose/Rate Route Frequency Ordered Stop   04/15/20 0800  cefTRIAXone (ROCEPHIN) 2 g in sodium chloride 0.9 % 100 mL IVPB     Discontinue     2 g 200 mL/hr over 30 Minutes Intravenous Every 24 hours 04/14/20 2004 04/20/20 0759   04/15/20 0800  azithromycin (ZITHROMAX) 500 mg in sodium chloride 0.9 % 250 mL IVPB     Discontinue     500 mg 250  mL/hr over 60 Minutes Intravenous Every 24 hours 04/14/20 2004 04/20/20 0759   04/14/20 2000  azithromycin (ZITHROMAX) 500 mg in sodium chloride 0.9 % 250 mL IVPB        500 mg 250 mL/hr over 60 Minutes Intravenous  Once 04/14/20 1951 04/14/20 2338   04/14/20 1930  cefTRIAXone (ROCEPHIN) 1 g in sodium chloride 0.9 % 100 mL IVPB        1 g 200 mL/hr over 30 Minutes Intravenous  Once 04/14/20 1921 04/14/20 2109        Objective:   Vitals:   04/16/20 0421 04/16/20 0453 04/16/20 0836 04/16/20 1557  BP: (!) 131/53     Pulse: (!) 48     Resp: 16     Temp: 99.3 F (37.4 C)     TempSrc: Oral     SpO2: 98% 97% 98% 99%  Weight:      Height:        Wt Readings from Last 3 Encounters:  04/14/20 99.8 kg  04/15/19 82.6 kg  03/25/19 81.6 kg    Intake/Output Summary (Last 24 hours) at 04/16/2020 1705 Last data filed at 04/16/2020 1500 Gross per 24 hour  Intake 700 ml  Output --  Net 700 ml    Physical Exam  Gen:- Awake Alert,  DOE HEENT:- [place].AT, No sclera icterus Nose- 2L/min Neck-Supple Neck,No JVD,.  Lungs-diminished breath sounds with rhonchi  CV- S1, S2 normal, regular  Abd-  +ve B.Sounds, Abd Soft, No tenderness,    Extremity/Skin:- No  edema, pedal pulses present  Psych-affect is appropriate, oriented x3 Neuro-generalized, no new focal deficits, no tremors   Data Review:   Micro Results Recent Results (from the past 240 hour(s))  SARS Coronavirus 2 by RT PCR (hospital order, performed in [facility] hospital lab) Nasopharyngeal Nasopharyngeal Swab     Status: None   Collection Time: 04/14/20  5:00 PM   Specimen: Nasopharyngeal Swab  Result Value Ref Range Status   SARS Coronavirus 2 NEGATIVE NEGATIVE Final    Comment: (NOTE) SARS-CoV-2 target nucleic acids are NOT DETECTED.  The SARS-CoV-2 RNA is generally detectable in upper and lower respiratory specimens during the acute phase of infection. The lowest concentration of SARS-CoV-2 viral copies this assay can  detect is 250 copies / mL. A negative result does not preclude SARS-CoV-2 infection and should not be used as the sole basis for treatment or other patient management decisions.  A negative result may occur with improper specimen collection / handling, submission of specimen other than nasopharyngeal swab, presence of viral mutation(s) within the areas targeted by this assay, and inadequate number of viral copies (<250  copies / mL). A negative result must be combined with clinical observations, patient history, and epidemiological information.  Fact Sheet for Patients:   StrictlyIdeas.no  Fact Sheet for Healthcare Providers: BankingDealers.co.za  This test is not yet approved or  cleared by the [place] FDA and has been authorized for detection and/or diagnosis of SARS-CoV-2 by FDA under an Emergency Use Authorization (EUA).  This EUA will remain in effect (meaning this test can be used) for the duration of the COVID-19 declaration under Section 564(b)(1) of the Act, 21 U.S.C. section 360bbb-3(b)(1), unless the authorization is terminated or revoked sooner.  Performed at [facility], [address] [place], [place] [address]   Culture, blood (Routine X 2) w Reflex to ID Panel     Status: None (Preliminary result)   Collection Time: 04/15/20 12:33 AM   Specimen: BLOOD  Result Value Ref Range Status   Specimen Description BLOOD LEFT ANTECUBITAL  Final   Special Requests   Final    BOTTLES DRAWN AEROBIC AND ANAEROBIC Blood Culture adequate volume   Culture   Final    NO GROWTH 1 DAY Performed at [facility], [address], [place], [place] [address]    Report Status PENDING  Incomplete  Culture, blood (Routine X 2) w Reflex to ID Panel     Status: None (Preliminary result)   Collection Time: 04/15/20 12:45 AM   Specimen: BLOOD LEFT HAND  Result Value Ref Range Status   Specimen Description BLOOD LEFT HAND  Final   Special  Requests   Final    BOTTLES DRAWN AEROBIC AND ANAEROBIC Blood Culture adequate volume   Culture   Final    NO GROWTH 1 DAY Performed at [facility], [address], [place], [place] [address]    Report Status PENDING  Incomplete    Radiology Reports CT Head Wo Contrast  Result Date: 04/14/2020 CLINICAL DATA:  79 year old female with head trauma. EXAM: CT HEAD WITHOUT CONTRAST TECHNIQUE: Contiguous axial images were obtained from the base of the skull through the vertex without intravenous contrast. COMPARISON:  Head CT dated 01/30/2019. FINDINGS: Evaluation of this exam is limited due to motion artifact. Brain: Mild age-related atrophy and chronic microvascular ischemic changes. There is no acute intracranial hemorrhage. No mass effect or midline shift no extra-axial fluid collection. Vascular: No hyperdense vessel or unexpected calcification. Skull: Normal. Negative for fracture or focal lesion. Sinuses/Orbits: No acute finding. Other: None IMPRESSION: 1. No acute intracranial pathology. 2. Mild age-related atrophy and chronic microvascular ischemic changes. Electronically Signed   By: Anner Crete M.D.   On: 04/14/2020 20:55   DG Chest Portable 1 View  Result Date: 04/14/2020 CLINICAL DATA:  Right hip and knee pain.  Fall. EXAM: PORTABLE CHEST 1 VIEW COMPARISON:  05/20/2017 FINDINGS: Cardiomegaly. Right upper lobe and patchy left perihilar and lower lobe airspace opacities concerning for pneumonia. No effusions. No acute bony abnormality. IMPRESSION: Patchy bilateral airspace opacities concerning for multifocal pneumonia. Electronically Signed   By: Rolm Baptise M.D.   On: 04/14/2020 18:35   DG Knee Complete 4 Views Right  Result Date: 04/14/2020 CLINICAL DATA:  Fall, right knee pain EXAM: RIGHT KNEE - COMPLETE 4+ VIEW COMPARISON:  None. FINDINGS: Moderate tricompartment degenerative changes with joint space narrowing and spurring. Small joint effusion. No acute bony abnormality.  Specifically, no fracture, subluxation, or dislocation. Vascular calcifications noted. IMPRESSION: Moderate degenerative changes. Small joint effusion. No acute bony abnormality. Electronically Signed   By: Rolm Baptise M.D.  On: 04/14/2020 18:36   ECHOCARDIOGRAM COMPLETE  Result Date: 04/15/2020    ECHOCARDIOGRAM REPORT   Patient Name:   BONNI NEUSER [facility] Date of Exam: 04/15/2020 Medical Rec #:  [id number]       Height:       67.0 in Accession #:    [id number]      Weight:       220.0 lb Date of Birth:  May 16, 1941       BSA:          2.106 m Patient Age:    28 years        BP:           100/69 mmHg Patient Gender: F               HR:           97 bpm. Exam Location:  [facility] Procedure: 2D Echo Indications:    Atrial Fibrillation 427.31 / I48.91  History:        Patient has prior history of Echocardiogram examinations, most                 recent 05/22/2017. COPD and Stroke, Arrythmias:Atrial                 Fibrillation, Signs/Symptoms:Syncope; Risk Factors:Current                 Smoker. GERD.  Sonographer:    Leavy Cella RDCS (AE) Referring Phys: [id number] OLADAPO ADEFESO IMPRESSIONS  1. Left ventricular ejection fraction, by estimation, is 60 to 65%. The left ventricle has normal function. The left ventricle has no regional wall motion abnormalities. There is mild left ventricular hypertrophy. Left ventricular diastolic parameters are indeterminate.  2. Right ventricular systolic function is moderately reduced. The right ventricular size is severely enlarged. There is moderately elevated pulmonary artery systolic pressure.  3. Left atrial size was severely dilated.  4. Right atrial size was severely dilated.  5. The mitral valve is normal in structure. No evidence of mitral valve regurgitation. No evidence of mitral stenosis.  6. The aortic valve is tricuspid. Aortic valve regurgitation is not visualized. No aortic stenosis is present.  7. The inferior vena cava is dilated in size with <50% respiratory  variability, suggesting right atrial pressure of 15 mmHg. FINDINGS  Left Ventricle: Left ventricular ejection fraction, by estimation, is 60 to 65%. The left ventricle has normal function. The left ventricle has no regional wall motion abnormalities. The left ventricular internal cavity size was normal in size. There is  mild left ventricular hypertrophy. Left ventricular diastolic parameters are indeterminate. Right Ventricle: The right ventricular size is severely enlarged. Right vetricular wall thickness was not assessed. Right ventricular systolic function is moderately reduced. There is moderately elevated pulmonary artery systolic pressure. The tricuspid regurgitant velocity is 3.19 m/s, and with an assumed right atrial pressure of 10 mmHg, the estimated right ventricular systolic pressure is 30.8 mmHg. Left Atrium: Left atrial size was severely dilated. Right Atrium: Right atrial size was severely dilated. Pericardium: There is no evidence of pericardial effusion. Mitral Valve: The mitral valve is normal in structure. There is mild thickening of the mitral valve leaflet(s). There is mild calcification of the mitral valve leaflet(s). Mild mitral annular calcification. No evidence of mitral valve regurgitation. No evidence of mitral valve stenosis. Tricuspid Valve: The tricuspid valve is normal in structure. Tricuspid valve regurgitation is mild . No evidence of tricuspid stenosis. Aortic Valve: The aortic valve is tricuspid. Marland Kitchen  There is mild thickening and mild calcification of the aortic valve. Aortic valve regurgitation is not visualized. No aortic stenosis is present. Mild aortic valve annular calcification. There is mild thickening of the aortic valve. There is mild calcification of the aortic valve. Aortic valve mean gradient measures 3.4 mmHg. Aortic valve peak gradient measures 6.2 mmHg. Aortic valve area, by VTI measures 2.36 cm. Pulmonic Valve: The pulmonic valve was not well visualized. Pulmonic  valve regurgitation is not visualized. No evidence of pulmonic stenosis. Aorta: The aortic root is normal in size and structure. Pulmonary Artery: Moderate pulmonary HTN, PASP 56 mmHg. Venous: The inferior vena cava is dilated in size with less than 50% respiratory variability, suggesting right atrial pressure of 15 mmHg. IAS/Shunts: No atrial level shunt detected by color flow Doppler.  LEFT VENTRICLE PLAX 2D LVIDd:         4.24 cm  Diastology LVIDs:         2.38 cm  LV e' lateral:   10.20 cm/s LV PW:         1.25 cm  LV E/e' lateral: 11.4 LV IVS:        1.27 cm  LV e' medial:    8.25 cm/s LVOT diam:     2.20 cm  LV E/e' medial:  14.1 LV SV:         52 LV SV Index:   25 LVOT Area:     3.80 cm  RIGHT VENTRICLE RV S prime:     9.49 cm/s TAPSE (M-mode): 1.8 cm LEFT ATRIUM             Index       RIGHT ATRIUM           Index LA diam:        5.10 cm 2.42 cm/m  RA Area:     34.10 cm LA Vol (A2C):   68.4 ml 32.48 ml/m RA Volume:   137.00 ml 65.06 ml/m LA Vol (A4C):   90.8 ml 43.12 ml/m LA Biplane Vol: 81.2 ml 38.56 ml/m  AORTIC VALVE AV Area (Vmax):    2.41 cm AV Area (Vmean):   1.91 cm AV Area (VTI):     2.36 cm AV Vmax:           124.88 cm/s AV Vmean:          87.334 cm/s AV VTI:            0.221 m AV Peak Grad:      6.2 mmHg AV Mean Grad:      3.4 mmHg LVOT Vmax:         79.27 cm/s LVOT Vmean:        43.795 cm/s LVOT VTI:          0.137 m LVOT/AV VTI ratio: 0.62  AORTA Ao Root diam: 2.80 cm MITRAL VALVE                TRICUSPID VALVE MV Area (PHT): 4.36 cm     TR Peak grad:   40.7 mmHg MV Decel Time: 174 msec     TR Vmax:        319.00 cm/s MV E velocity: 116.00 cm/s                             SHUNTS  Systemic VTI:  0.14 m                             Systemic Diam: 2.20 cm Carlyle Dolly MD Electronically signed by Carlyle Dolly MD Signature Date/Time: 04/15/2020/3:51:48 PM    Final    DG HIP UNILAT W OR W/O PELVIS 2-3 VIEWS RIGHT  Result Date: 04/14/2020 CLINICAL DATA:   Fall, right hip pain EXAM: DG HIP (WITH OR WITHOUT PELVIS) 2-3V RIGHT COMPARISON:  None. FINDINGS: Mild symmetric degenerative changes in the hips. Postoperative changes in the visualized lower lumbar spine. SI joints symmetric and unremarkable. No acute bony abnormality. Specifically, no fracture, subluxation, or dislocation. IMPRESSION: No acute bony abnormality. Electronically Signed   By: Rolm Baptise M.D.   On: 04/14/2020 18:35     CBC Recent Labs  Lab 04/14/20 1733 04/16/20 0609  WBC 20.8* 13.6*  HGB 12.6 11.0*  HCT 41.9 36.3  PLT 180 166  MCV 98.8 97.8  MCH 29.7 29.6  MCHC 30.1 30.3  RDW 14.3 14.2  LYMPHSABS 0.7  --   MONOABS 0.9  --   EOSABS 0.0  --   BASOSABS 0.1  --     Chemistries  Recent Labs  Lab 04/14/20 1733 04/16/20 0609  NA 137 138  K 3.8 3.5  CL 100 101  CO2 27 26  GLUCOSE 124* 100*  BUN 18 15  CREATININE 0.73 0.56  CALCIUM 8.3* 8.7*   ------------------------------------------------------------------------------------------------------------------ No results for input(s): CHOL, HDL, LDLCALC, TRIG, CHOLHDL, LDLDIRECT in the last 72 hours.  Lab Results  Component Value Date   HGBA1C 5.8 (H) 07/31/2013   ------------------------------------------------------------------------------------------------------------------ No results for input(s): TSH, T4TOTAL, T3FREE, THYROIDAB in the last 72 hours.  Invalid input(s): FREET3 ------------------------------------------------------------------------------------------------------------------ No results for input(s): VITAMINB12, FOLATE, FERRITIN, TIBC, IRON, RETICCTPCT in the last 72 hours.  Coagulation profile No results for input(s): INR, PROTIME in the last 168 hours.  No results for input(s): DDIMER in the last 72 hours.  Cardiac Enzymes No results for input(s): CKMB, TROPONINI, MYOGLOBIN in the last 168 hours.  Invalid input(s):  CK ------------------------------------------------------------------------------------------------------------------ No results found for: BNP   Roxan Hockey M.D on 04/16/2020 at 5:05 PM  Go to [email or web address] - for contact info  [facility] - Office  [phone]

## 2020-04-16 NOTE — NC FL2 (Signed)
[place] LEVEL OF CARE SCREENING TOOL     IDENTIFICATION  Patient Name: Meagan Holt Birthdate: 19-Dec-1940 Sex: female Admission Date (Current Location): 04/14/2020  [facility] and [place] Number:  [employer] and Address:  [place] [address] [place]      Provider Number: [id number]  Attending Physician Name and Address:  Roxan Hockey, MD  Relative Name and Phone Number:       Current Level of Care: Hospital Recommended Level of Care: [place] Prior Approval Number:    Date Approved/Denied:   PASRR Number:    Discharge Plan: SNF    Current Diagnoses: Patient Active Problem List   Diagnosis Date Noted  . CAP (community acquired pneumonia) 04/14/2020  . Acute on chronic respiratory failure with hypoxia ([place]) 04/14/2020  . Recurrent falls 04/14/2020  . Atrial fibrillation with RVR ([place]) 04/14/2020  . Leukocytosis 04/14/2020  . Obesity (BMI 30.0-34.9) 04/14/2020  . Benign paroxysmal positional vertigo 09/08/2018  . Cellulitis of right lower leg 09/08/2018  . Orthostatic hypotension 05/22/2017  . Syncope and collapse 05/21/2017  . Chronic pain syndrome 05/21/2017  . Altered mental status 05/20/2017  . GERD (gastroesophageal reflux disease) 05/20/2017  . Depression 05/20/2017  . History of stroke 05/20/2017  . Premature beats 05/20/2017  . Cervical spondylosis without myelopathy 05/15/2017  . Lumbar spondylosis 05/15/2017  . 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 Decreased range of motion of wrist 11/28/2016  . Anxiety 05/10/2016  . Head injury 07/30/2013  . Difficulty walking 07/30/2013  . COPD (chronic obstructive pulmonary disease) ([place]) 07/30/2013    Orientation RESPIRATION BLADDER Height & Weight     Self, Time, Situation, Place  O2 (see dc summaryt) Continent Weight: 220 lb (99.8 kg) Height:  5\' 7"  (170.2 cm)  BEHAVIORAL SYMPTOMS/MOOD  NEUROLOGICAL BOWEL NUTRITION STATUS      Continent Diet (see dc summary)  AMBULATORY STATUS COMMUNICATION OF NEEDS Skin   Extensive Assist Verbally                         Personal Care Assistance Level of Assistance  Bathing, Feeding, Dressing Bathing Assistance: Limited assistance Feeding assistance: Limited assistance Dressing Assistance: Limited assistance     Functional Limitations Info  Sight, Hearing, Speech Sight Info: Impaired Hearing Info: Adequate Speech Info: Adequate    SPECIAL CARE FACTORS FREQUENCY  PT (By licensed PT), OT (By licensed OT)     PT Frequency: 5x week OT Frequency: 3x week            Contractures Contractures Info: Not present    Additional Factors Info  Code Status, Allergies Code Status Info: full Allergies Info: Latex, Doxycycline           Current Medications (04/16/2020):  This is the current hospital active medication list Current Facility-Administered Medications  Medication Dose Route Frequency Provider Last Rate Last Admin  . albuterol (PROVENTIL) (2.5 MG/3ML) 0.083% nebulizer solution 2.5 mg  2.5 mg Nebulization Q6H PRN Adefeso, Oladapo, DO   2.5 mg at 04/16/20 0452  . aspirin EC tablet 325 mg  325 mg Oral q morning - 10a Adefeso, Oladapo, DO   325 mg at 04/16/20 1014  . atorvastatin (LIPITOR) tablet 20 mg  20 mg Oral Daily Emokpae, Courage, MD   20 mg at 04/16/20 1015  . azithromycin (ZITHROMAX) 500 mg in sodium chloride 0.9 % 250 mL IVPB  500 mg Intravenous Q24H Adefeso, Oladapo, DO 250 mL/hr at 04/16/20 1010 500 mg at 04/16/20 1010  . cefTRIAXone (ROCEPHIN) 2 g in sodium chloride 0.9 % 100 mL IVPB  2 g Intravenous Q24H Adefeso, Oladapo, DO 200 mL/hr at 04/16/20 1206 2 g at 04/16/20 1206  . dextromethorphan-guaiFENesin (MUCINEX DM) 30-600 MG per 12 hr tablet 1 tablet  1 tablet Oral BID Adefeso, Oladapo, DO   1 tablet at 04/16/20 1014  . enoxaparin (LOVENOX) injection 40 mg  40 mg Subcutaneous Q24H Emokpae, Courage, MD   40  mg at 04/16/20 1202  . guaiFENesin (MUCINEX) 12 hr tablet 600 mg  600 mg Oral BID Emokpae, Courage, MD   600 mg at 04/16/20 1014  . ipratropium-albuterol (DUONEB) 0.5-2.5 (3) MG/3ML nebulizer solution 3 mL  3 mL Nebulization TID Roxan Hockey, MD   3 mL at 04/16/20 0836  . mometasone-formoterol (DULERA) 200-5 MCG/ACT inhaler 2 puff  2 puff Inhalation BID Adefeso, Oladapo, DO   2 puff at 04/16/20 0836  . omega-3 acid ethyl esters (LOVAZA) capsule 1 g  1 g Oral Daily Adefeso, Oladapo, DO   1 g at 04/16/20 1014     Discharge Medications: Please see discharge summary for a list of discharge medications.  Relevant Imaging Results:  Relevant Lab Results:   Additional Information SSN: [place], LCSW

## 2020-04-16 NOTE — TOC Initial Note (Signed)
Transition of Care [facility]) - Initial/Assessment Note    Patient Details  Name: Meagan Holt MRN: [id number] Date of Birth: 1941/08/18  Transition of Care [facility]) CM/SW Contact:    Shade Flood, LCSW Phone Number: 04/16/2020, 12:48 PM  Clinical Narrative:                  Pt admitted from home. She lives alone and has impaired vision. Pt reports no family support. PT/OT recommending SNF. Met with pt who expresses agreement with the plan. Reviewed CMS SNF list. Will refer as reqeusted.   TOC will follow.  Expected Discharge Plan: Skilled Nursing Facility Barriers to Discharge: Continued Medical Work up, Ship broker, SNF Pending bed offer   Patient Goals and CMS Choice Patient states their goals for this hospitalization and ongoing recovery are:: get better CMS Medicare.gov Compare Post Acute Care list provided to:: Patient Choice offered to / list presented to : Patient  Expected Discharge Plan and Services Expected Discharge Plan: [place] In-house Referral: Clinical Social Work   Post Acute Care Choice: [place] Living arrangements for the past 2 months: Apartment                                      Prior Living Arrangements/Services Living arrangements for the past 2 months: Apartment Lives with:: Self Patient language and need for interpreter reviewed:: Yes Do you feel safe going back to the place where you live?: No   weakness  Need for Family Participation in Patient Care: Yes (Comment) Care giver support system in place?: No (comment)   Criminal Activity/Legal Involvement Pertinent to Current Situation/Hospitalization: No - Comment as needed  Activities of Daily Living      Permission Sought/Granted Permission sought to share information with : Chartered certified accountant granted to share information with : Yes, Verbal Permission Granted     Permission granted to share info w AGENCY: local  snfs        Emotional Assessment Appearance:: Appears stated age Attitude/Demeanor/Rapport: Engaged Affect (typically observed): Flat Orientation: : Oriented to Self, Oriented to Place, Oriented to  Time, Oriented to Situation Alcohol / Substance Use: Not Applicable Psych Involvement: No (comment)  Admission diagnosis:  Weakness [R53.1] CAP (community acquired pneumonia) [J18.9] Fall, initial encounter [W19.XXXA] Community acquired pneumonia, unspecified laterality [J18.9] Patient Active Problem List   Diagnosis Date Noted  . CAP (community acquired pneumonia) 04/14/2020  . Acute on chronic respiratory failure with hypoxia ([place]) 04/14/2020  . Recurrent falls 04/14/2020  . Atrial fibrillation with RVR ([place]) 04/14/2020  . Leukocytosis 04/14/2020  . Obesity (BMI 30.0-34.9) 04/14/2020  . Benign paroxysmal positional vertigo 09/08/2018  . Cellulitis of right lower leg 09/08/2018  . Orthostatic hypotension 05/22/2017  . Syncope and collapse 05/21/2017  . Chronic pain syndrome 05/21/2017  . Altered mental status 05/20/2017  . GERD (gastroesophageal reflux disease) 05/20/2017  . Depression 05/20/2017  . History of stroke 05/20/2017  . Premature beats 05/20/2017  . Cervical spondylosis without myelopathy 05/15/2017  . Lumbar spondylosis 05/15/2017  . 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 Decreased range of motion of wrist 11/28/2016  . Anxiety 05/10/2016  . Head injury 07/30/2013  . Difficulty walking 07/30/2013  . COPD (chronic obstructive pulmonary disease) ([place]) 07/30/2013   PCP:  [name], MD Pharmacy:   [facility] [address] [place] [address] Phone: [phone] Fax: [phone]  [id number] [place], [place] - [address] [place] [address]Phone: [phone]: [phone]    Social Determinants  of Health (SDOH) Interventions    Readmission Risk Interventions Readmission Risk Prevention Plan 04/16/2020  Medication Screening Complete  Transportation Screening Complete  Some recent data might be hidden

## 2020-04-17 NOTE — Progress Notes (Signed)
Patient Demographics:    Meagan Holt, is a 79 y.o. female, DOB - 11/29/1940, SAY:[id number]  Admit date - 04/14/2020   Admitting Physician [name], DO  Outpatient Primary MD for the patient is [name], MD  LOS - 3   Chief Complaint  Patient presents with  . Fall        Subjective:    Meagan Holt today has no fevers, no emesis,  No chest pain,    cough and shortness of breath improving, -Dyspnea with minimal activity and hypoxia persist -Patient verbalizing concerns that she has very limited list of SNF facilities that she would be willing to go to -Patient's neighbor is concerned that patient cannot return home safely she lives alone and is very high risk for falling if discharged home alone  Assessment  & Plan :    Principal Problem:   CAP (community acquired pneumonia) Active Problems:   COPD (chronic obstructive pulmonary disease) ([place])   History of stroke   Acute on chronic respiratory failure with hypoxia ([place])   Recurrent falls   Atrial fibrillation with RVR ([place])   Leukocytosis   Obesity (BMI 30.0-34.9)  Brief Summary:- 79 y.o. female with medical history significant for arthritis, chronic back pain, COPD on supplemental oxygen via [place] at 2 LPM at night, CVA and frequent mechanical falls admitted on 04/14/2020 after mechanical fall at home and found to have bilateral pneumonia with acute hypoxic respiratory failure  A/p 1)Bilateral Pneumonia---POA-- ---cough and shortness of breath improving  --Dyspnea with minimal activity and hypoxia persist --c/n iv  Rocephin and azithromycin, mucolytics, bronchodilators and supplemental oxygen as needed  2)PAFib with RVR--- Echo with EF of 60 to 65%, -CHA2DS2-VASc score 5, annual stroke risk 7.2 -however patient's  HAS BLED score is 2 and history of recurrent falls -Risk versus benefits of FULL anticoagulation discussed with  patient and her friend -They have decided not to do for anticoagulation at this time  3)Ambulatory dysfunction/back pain/recurrent falls----PTA patient lived alone, does not have caregivers on 24/7 supervision,  --PT/OT recommends SNF rehab  4) history of prior stroke--aspirin and Lipitor as ordered  5)COPD--- able, continue bronchodilators as above #1  6) acute hypoxic respiratory failure--- secondary to #1 above --Try to wean off O2  Disposition/Need for in-Hospital Stay- patient unable to be discharged at this time due to --- pneumonia with hypoxia requiring IV antibiotics and supplemental oxygen* --Patient verbalizing concerns that she has very limited list of SNF facilities that she would be willing to go to -Patient's neighbor is concerned that patient cannot return home safely she lives alone and is very high risk for falling if discharged home alone  Status is: Inpatient  Remains inpatient appropriate because:- pneumonia requiring IV antibiotics and supplemental oxygen*   Disposition: The patient is from: Home              Anticipated d/c is to: SNF              Anticipated d/c date is: 1 day              Patient currently is not medically stable to d/c. Barriers: Not Clinically Stable- -- pneumonia requiring IV antibiotics and supplemental oxygen ---Patient verbalizing concerns that she has  very limited list of SNF facilities that she would be willing to go to -Patient's neighbor is concerned that patient cannot return home safely she lives alone and is very high risk for falling if discharged home alone  Code Status : Full  Family Communication:    (patient is alert, awake and coherent)   Consults  :  na  DVT Prophylaxis  :  Lovenox - - SCDs    Lab Results  Component Value Date   PLT 166 04/16/2020   Inpatient Medications  Scheduled Meds: . aspirin EC  325 mg Oral q morning - 10a  . atorvastatin  20 mg Oral Daily  . dextromethorphan-guaiFENesin  1 tablet Oral  BID  . enoxaparin (LOVENOX) injection  40 mg Subcutaneous Q24H  . guaiFENesin  600 mg Oral BID  . ipratropium-albuterol  3 mL Nebulization TID  . mometasone-formoterol  2 puff Inhalation BID  . omega-3 acid ethyl esters  1 g Oral Daily   Continuous Infusions: . azithromycin 500 mg (04/17/20 1009)  . cefTRIAXone (ROCEPHIN)  IV 2 g (04/17/20 0839)   PRN Meds:.albuterol  Anti-infectives (From admission, onward)   Start     Dose/Rate Route Frequency Ordered Stop   04/15/20 0800  cefTRIAXone (ROCEPHIN) 2 g in sodium chloride 0.9 % 100 mL IVPB     Discontinue     2 g 200 mL/hr over 30 Minutes Intravenous Every 24 hours 04/14/20 2004 04/20/20 0759   04/15/20 0800  azithromycin (ZITHROMAX) 500 mg in sodium chloride 0.9 % 250 mL IVPB     Discontinue     500 mg 250 mL/hr over 60 Minutes Intravenous Every 24 hours 04/14/20 2004 04/20/20 0759   04/14/20 2000  azithromycin (ZITHROMAX) 500 mg in sodium chloride 0.9 % 250 mL IVPB        500 mg 250 mL/hr over 60 Minutes Intravenous  Once 04/14/20 1951 04/14/20 2338   04/14/20 1930  cefTRIAXone (ROCEPHIN) 1 g in sodium chloride 0.9 % 100 mL IVPB        1 g 200 mL/hr over 30 Minutes Intravenous  Once 04/14/20 1921 04/14/20 2109        Objective:   Vitals:   04/17/20 0717 04/17/20 1330 04/17/20 1421 04/17/20 1928  BP:  136/83    Pulse:  62    Resp:  16    Temp:  98 F (36.7 C)    TempSrc:  Oral    SpO2: 96% 94% 96% 93%  Weight:      Height:        Wt Readings from Last 3 Encounters:  04/14/20 99.8 kg  04/15/19 82.6 kg  03/25/19 81.6 kg    Intake/Output Summary (Last 24 hours) at 04/17/2020 2003 Last data filed at 04/17/2020 1456 Gross per 24 hour  Intake --  Output 250 ml  Net -250 ml    Physical Exam  Gen:- Awake Alert,  DOE HEENT:- [place].AT, No sclera icterus Nose- 2L/min Neck-Supple Neck,No JVD,.  Lungs-diminished breath sounds with rhonchi  CV- S1, S2 normal, regular  Abd-  +ve B.Sounds, Abd Soft, No tenderness,     Extremity/Skin:- No  edema, pedal pulses present  Psych-affect is appropriate, oriented x3 Neuro-generalized, no new focal deficits, no tremors   Data Review:   Micro Results Recent Results (from the past 240 hour(s))  SARS Coronavirus 2 by RT PCR (hospital order, performed in [facility] hospital lab) Nasopharyngeal Nasopharyngeal Swab     Status: None   Collection Time: 04/14/20  5:00 PM   Specimen: Nasopharyngeal Swab  Result Value Ref Range Status   SARS Coronavirus 2 NEGATIVE NEGATIVE Final    Comment: (NOTE) SARS-CoV-2 target nucleic acids are NOT DETECTED.  The SARS-CoV-2 RNA is generally detectable in upper and lower respiratory specimens during the acute phase of infection. The lowest concentration of SARS-CoV-2 viral copies this assay can detect is 250 copies / mL. A negative result does not preclude SARS-CoV-2 infection and should not be used as the sole basis for treatment or other patient management decisions.  A negative result may occur with improper specimen collection / handling, submission of specimen other than nasopharyngeal swab, presence of viral mutation(s) within the areas targeted by this assay, and inadequate number of viral copies (<250 copies / mL). A negative result must be combined with clinical observations, patient history, and epidemiological information.  Fact Sheet for Patients:   StrictlyIdeas.no  Fact Sheet for Healthcare Providers: BankingDealers.co.za  This test is not yet approved or  cleared by the [place] FDA and has been authorized for detection and/or diagnosis of SARS-CoV-2 by FDA under an Emergency Use Authorization (EUA).  This EUA will remain in effect (meaning this test can be used) for the duration of the COVID-19 declaration under Section 564(b)(1) of the Act, 21 U.S.C. section 360bbb-3(b)(1), unless the authorization is terminated or revoked sooner.  Performed at [facility], [address] [place], [place] [address]   Culture, blood (Routine X 2) w Reflex to ID Panel     Status: None (Preliminary result)   Collection Time: 04/15/20 12:33 AM   Specimen: BLOOD  Result Value Ref Range Status   Specimen Description BLOOD LEFT ANTECUBITAL  Final   Special Requests   Final    BOTTLES DRAWN AEROBIC AND ANAEROBIC Blood Culture adequate volume   Culture   Final    NO GROWTH 2 DAYS Performed at [facility], [address], [place], [place] [address]    Report Status PENDING  Incomplete  Culture, blood (Routine X 2) w Reflex to ID Panel     Status: None (Preliminary result)   Collection Time: 04/15/20 12:45 AM   Specimen: BLOOD LEFT HAND  Result Value Ref Range Status   Specimen Description BLOOD LEFT HAND  Final   Special Requests   Final    BOTTLES DRAWN AEROBIC AND ANAEROBIC Blood Culture adequate volume   Culture   Final    NO GROWTH 2 DAYS Performed at [facility], [address] [place], [place] [address]    Report Status PENDING  Incomplete    Radiology Reports CT Head Wo Contrast  Result Date: 04/14/2020 CLINICAL DATA:  79 year old female with head trauma. EXAM: CT HEAD WITHOUT CONTRAST TECHNIQUE: Contiguous axial images were obtained from the base of the skull through the vertex without intravenous contrast. COMPARISON:  Head CT dated 01/30/2019. FINDINGS: Evaluation of this exam is limited due to motion artifact. Brain: Mild age-related atrophy and chronic microvascular ischemic changes. There is no acute intracranial hemorrhage. No mass effect or midline shift no extra-axial fluid collection. Vascular: No hyperdense vessel or unexpected calcification. Skull: Normal. Negative for fracture or focal lesion. Sinuses/Orbits: No acute finding. Other: None IMPRESSION: 1. No acute intracranial pathology. 2. Mild age-related atrophy and chronic microvascular ischemic changes. Electronically Signed   By: Anner Crete M.D.   On: 04/14/2020 20:55   DG  Chest Portable 1 View  Result Date: 04/14/2020 CLINICAL DATA:  Right hip and knee pain.  Fall. EXAM: PORTABLE  CHEST 1 VIEW COMPARISON:  05/20/2017 FINDINGS: Cardiomegaly. Right upper lobe and patchy left perihilar and lower lobe airspace opacities concerning for pneumonia. No effusions. No acute bony abnormality. IMPRESSION: Patchy bilateral airspace opacities concerning for multifocal pneumonia. Electronically Signed   By: Rolm Baptise M.D.   On: 04/14/2020 18:35   DG Knee Complete 4 Views Right  Result Date: 04/14/2020 CLINICAL DATA:  Fall, right knee pain EXAM: RIGHT KNEE - COMPLETE 4+ VIEW COMPARISON:  None. FINDINGS: Moderate tricompartment degenerative changes with joint space narrowing and spurring. Small joint effusion. No acute bony abnormality. Specifically, no fracture, subluxation, or dislocation. Vascular calcifications noted. IMPRESSION: Moderate degenerative changes. Small joint effusion. No acute bony abnormality. Electronically Signed   By: Rolm Baptise M.D.   On: 04/14/2020 18:36   ECHOCARDIOGRAM COMPLETE  Result Date: 04/15/2020    ECHOCARDIOGRAM REPORT   Patient Name:   DARETH ANDREW [facility] Date of Exam: 04/15/2020 Medical Rec #:  [id number]       Height:       67.0 in Accession #:    [id number]      Weight:       220.0 lb Date of Birth:  27-May-1941       BSA:          2.106 m Patient Age:    15 years        BP:           100/69 mmHg Patient Gender: F               HR:           97 bpm. Exam Location:  [facility] Procedure: 2D Echo Indications:    Atrial Fibrillation 427.31 / I48.91  History:        Patient has prior history of Echocardiogram examinations, most                 recent 05/22/2017. COPD and Stroke, Arrythmias:Atrial                 Fibrillation, Signs/Symptoms:Syncope; Risk Factors:Current                 Smoker. GERD.  Sonographer:    Leavy Cella RDCS (AE) Referring Phys: [id number] OLADAPO ADEFESO IMPRESSIONS  1. Left ventricular ejection fraction, by estimation, is 60 to 65%.  The left ventricle has normal function. The left ventricle has no regional wall motion abnormalities. There is mild left ventricular hypertrophy. Left ventricular diastolic parameters are indeterminate.  2. Right ventricular systolic function is moderately reduced. The right ventricular size is severely enlarged. There is moderately elevated pulmonary artery systolic pressure.  3. Left atrial size was severely dilated.  4. Right atrial size was severely dilated.  5. The mitral valve is normal in structure. No evidence of mitral valve regurgitation. No evidence of mitral stenosis.  6. The aortic valve is tricuspid. Aortic valve regurgitation is not visualized. No aortic stenosis is present.  7. The inferior vena cava is dilated in size with <50% respiratory variability, suggesting right atrial pressure of 15 mmHg. FINDINGS  Left Ventricle: Left ventricular ejection fraction, by estimation, is 60 to 65%. The left ventricle has normal function. The left ventricle has no regional wall motion abnormalities. The left ventricular internal cavity size was normal in size. There is  mild left ventricular hypertrophy. Left ventricular diastolic parameters are indeterminate. Right Ventricle: The right ventricular size is severely enlarged. Right vetricular wall thickness was not assessed. Right ventricular systolic function is  moderately reduced. There is moderately elevated pulmonary artery systolic pressure. The tricuspid regurgitant velocity is 3.19 m/s, and with an assumed right atrial pressure of 10 mmHg, the estimated right ventricular systolic pressure is 97.9 mmHg. Left Atrium: Left atrial size was severely dilated. Right Atrium: Right atrial size was severely dilated. Pericardium: There is no evidence of pericardial effusion. Mitral Valve: The mitral valve is normal in structure. There is mild thickening of the mitral valve leaflet(s). There is mild calcification of the mitral valve leaflet(s). Mild mitral annular  calcification. No evidence of mitral valve regurgitation. No evidence of mitral valve stenosis. Tricuspid Valve: The tricuspid valve is normal in structure. Tricuspid valve regurgitation is mild . No evidence of tricuspid stenosis. Aortic Valve: The aortic valve is tricuspid. . There is mild thickening and mild calcification of the aortic valve. Aortic valve regurgitation is not visualized. No aortic stenosis is present. Mild aortic valve annular calcification. There is mild thickening of the aortic valve. There is mild calcification of the aortic valve. Aortic valve mean gradient measures 3.4 mmHg. Aortic valve peak gradient measures 6.2 mmHg. Aortic valve area, by VTI measures 2.36 cm. Pulmonic Valve: The pulmonic valve was not well visualized. Pulmonic valve regurgitation is not visualized. No evidence of pulmonic stenosis. Aorta: The aortic root is normal in size and structure. Pulmonary Artery: Moderate pulmonary HTN, PASP 56 mmHg. Venous: The inferior vena cava is dilated in size with less than 50% respiratory variability, suggesting right atrial pressure of 15 mmHg. IAS/Shunts: No atrial level shunt detected by color flow Doppler.  LEFT VENTRICLE PLAX 2D LVIDd:         4.24 cm  Diastology LVIDs:         2.38 cm  LV e' lateral:   10.20 cm/s LV PW:         1.25 cm  LV E/e' lateral: 11.4 LV IVS:        1.27 cm  LV e' medial:    8.25 cm/s LVOT diam:     2.20 cm  LV E/e' medial:  14.1 LV SV:         52 LV SV Index:   25 LVOT Area:     3.80 cm  RIGHT VENTRICLE RV S prime:     9.49 cm/s TAPSE (M-mode): 1.8 cm LEFT ATRIUM             Index       RIGHT ATRIUM           Index LA diam:        5.10 cm 2.42 cm/m  RA Area:     34.10 cm LA Vol (A2C):   68.4 ml 32.48 ml/m RA Volume:   137.00 ml 65.06 ml/m LA Vol (A4C):   90.8 ml 43.12 ml/m LA Biplane Vol: 81.2 ml 38.56 ml/m  AORTIC VALVE AV Area (Vmax):    2.41 cm AV Area (Vmean):   1.91 cm AV Area (VTI):     2.36 cm AV Vmax:           124.88 cm/s AV Vmean:           87.334 cm/s AV VTI:            0.221 m AV Peak Grad:      6.2 mmHg AV Mean Grad:      3.4 mmHg LVOT Vmax:         79.27 cm/s LVOT Vmean:        43.795 cm/s LVOT VTI:  0.137 m LVOT/AV VTI ratio: 0.62  AORTA Ao Root diam: 2.80 cm MITRAL VALVE                TRICUSPID VALVE MV Area (PHT): 4.36 cm     TR Peak grad:   40.7 mmHg MV Decel Time: 174 msec     TR Vmax:        319.00 cm/s MV E velocity: 116.00 cm/s                             SHUNTS                             Systemic VTI:  0.14 m                             Systemic Diam: 2.20 cm Carlyle Dolly MD Electronically signed by Carlyle Dolly MD Signature Date/Time: 04/15/2020/3:51:48 PM    Final    DG HIP UNILAT W OR W/O PELVIS 2-3 VIEWS RIGHT  Result Date: 04/14/2020 CLINICAL DATA:  Fall, right hip pain EXAM: DG HIP (WITH OR WITHOUT PELVIS) 2-3V RIGHT COMPARISON:  None. FINDINGS: Mild symmetric degenerative changes in the hips. Postoperative changes in the visualized lower lumbar spine. SI joints symmetric and unremarkable. No acute bony abnormality. Specifically, no fracture, subluxation, or dislocation. IMPRESSION: No acute bony abnormality. Electronically Signed   By: Rolm Baptise M.D.   On: 04/14/2020 18:35     CBC Recent Labs  Lab 04/14/20 1733 04/16/20 0609  WBC 20.8* 13.6*  HGB 12.6 11.0*  HCT 41.9 36.3  PLT 180 166  MCV 98.8 97.8  MCH 29.7 29.6  MCHC 30.1 30.3  RDW 14.3 14.2  LYMPHSABS 0.7  --   MONOABS 0.9  --   EOSABS 0.0  --   BASOSABS 0.1  --     Chemistries  Recent Labs  Lab 04/14/20 1733 04/16/20 0609  NA 137 138  K 3.8 3.5  CL 100 101  CO2 27 26  GLUCOSE 124* 100*  BUN 18 15  CREATININE 0.73 0.56  CALCIUM 8.3* 8.7*   ------------------------------------------------------------------------------------------------------------------ No results for input(s): CHOL, HDL, LDLCALC, TRIG, CHOLHDL, LDLDIRECT in the last 72 hours.  Lab Results  Component Value Date   HGBA1C 5.8 (H) 07/31/2013    ------------------------------------------------------------------------------------------------------------------ No results for input(s): TSH, T4TOTAL, T3FREE, THYROIDAB in the last 72 hours.  Invalid input(s): FREET3 ------------------------------------------------------------------------------------------------------------------ No results for input(s): VITAMINB12, FOLATE, FERRITIN, TIBC, IRON, RETICCTPCT in the last 72 hours.  Coagulation profile No results for input(s): INR, PROTIME in the last 168 hours.  No results for input(s): DDIMER in the last 72 hours.  Cardiac Enzymes No results for input(s): CKMB, TROPONINI, MYOGLOBIN in the last 168 hours.  Invalid input(s): CK ------------------------------------------------------------------------------------------------------------------ No results found for: BNP   Roxan Hockey M.D on 04/17/2020 at 8:03 PM  Go to [email or web address] - for contact info  [facility] - Office  [phone]

## 2020-04-17 NOTE — Progress Notes (Signed)
Per Cleophus Molt neighbor, patient called her to inform her that she is refusing to go to the [facility]. Patient stated she would rather go to the [facility] or be discharged to her home. Per Ms Garwin Brothers, there is concern for patient safety at her home because she lives alone. Ms Garwin Brothers is requesting home health aide if patient is not able to be discharged to the [facility].

## 2020-04-18 DIAGNOSIS — R6520 Severe sepsis without septic shock: Secondary | ICD-10-CM

## 2020-04-18 DIAGNOSIS — A419 Sepsis, unspecified organism: Secondary | ICD-10-CM | POA: Diagnosis present

## 2020-04-18 LAB — BASIC METABOLIC PANEL
Anion gap: 8 (ref 5–15)
BUN: 13 mg/dL (ref 8–23)
CO2: 27 mmol/L (ref 22–32)
Calcium: 8.5 mg/dL — ABNORMAL LOW (ref 8.9–10.3)
Chloride: 103 mmol/L (ref 98–111)
Creatinine, Ser: 0.44 mg/dL (ref 0.44–1.00)
GFR calc Af Amer: >60 mL/min (ref >60)
GFR calc non Af Amer: >60 mL/min (ref >60)
Glucose, Bld: 96 mg/dL (ref 70–99)
Potassium: 3.1 mmol/L — ABNORMAL LOW (ref 3.5–5.1)
Sodium: 138 mmol/L (ref 135–145)

## 2020-04-18 LAB — CBC
HCT: 36 % (ref 36.0–46.0)
Hemoglobin: 10.9 g/dL — ABNORMAL LOW (ref 12.0–15.0)
MCH: 29.6 pg (ref 26.0–34.0)
MCHC: 30.3 g/dL (ref 30.0–36.0)
MCV: 97.8 fL (ref 80.0–100.0)
Platelets: 167 10*3/uL (ref 150–400)
RBC: 3.68 MIL/uL — ABNORMAL LOW (ref 3.87–5.11)
RDW: 14.2 % (ref 11.5–15.5)
WBC: 6.7 10*3/uL (ref 4.0–10.5)
nRBC: 0 % (ref 0.0–0.2)

## 2020-04-18 LAB — SARS CORONAVIRUS 2 BY RT PCR (HOSPITAL ORDER, PERFORMED IN ~~LOC~~ HOSPITAL LAB): SARS Coronavirus 2: NEGATIVE

## 2020-04-18 MED ORDER — POTASSIUM CHLORIDE CRYS ER 20 MEQ PO TBCR
40.0000 meq | EXTENDED_RELEASE_TABLET | ORAL | Status: AC
  Administered 2020-04-18: 40 meq via ORAL
  Filled 2020-04-18: qty 2

## 2020-04-18 MED ORDER — HYDROCODONE-ACETAMINOPHEN 7.5-325 MG PO TABS
1.0000 | ORAL_TABLET | Freq: Four times a day (QID) | ORAL | Status: DC | PRN
  Administered 2020-04-18 – 2020-04-19 (×4): 1 via ORAL
  Filled 2020-04-18 (×4): qty 1

## 2020-04-18 MED ORDER — HYDROCODONE-ACETAMINOPHEN 5-325 MG PO TABS
1.0000 | ORAL_TABLET | Freq: Once | ORAL | Status: AC
  Administered 2020-04-18: 1 via ORAL
  Filled 2020-04-18: qty 1

## 2020-04-18 NOTE — Progress Notes (Signed)
Patient Demographics:    Meagan Holt, is a 79 y.o. female, DOB - June 02, 1941, MBT:[id number]  Admit date - 04/14/2020   Admitting Physician Meagan Hoit, DO  Outpatient Primary MD for the patient is Meagan Sacramento, MD  LOS - 4   Chief Complaint  Patient presents with  . Fall        Subjective:    Meagan Holt today has no fevers, no emesis,  No chest pain,   -cough persist, shortness of breath improving - Oral intake is fair --Oxygen requirement is getting close to baseline   Assessment  & Plan :    Principal Problem:   Severe sepsis due to bilateral pneumonia with acute on chronic hypoxic respiratory failure Active Problems:   COPD (chronic obstructive pulmonary disease) (HCC)   History of stroke   CAP (community acquired pneumonia)   Acute on chronic respiratory failure with hypoxia (HCC)   Recurrent falls   Atrial fibrillation with RVR (HCC)   Leukocytosis   Obesity (BMI 30.0-34.9)  Brief Summary:- 79 y.o. female with medical history significant for arthritis, chronic back pain, COPD on supplemental oxygen via [place] at 2 LPM at night, CVA and frequent mechanical falls admitted on 04/14/2020 after mechanical fall at home and found to have bilateral pneumonia with acute hypoxic respiratory failure -Patient did Meet criteria for severe sepsis on admission with tachycardia up to HR of 116 at 3:54 AM on 04/15/2020, with tachypnea up to RR of 27 at 3:53 AM on 04/15/2020, leukocytosis, worsening hypoxia (acute on chronic hypoxic respiratory failure ) and clinical and imaging findings of bilateral pneumonia   A/p 1)Bilateral Pneumonia---POA-- ---cough and shortness of breath improving  --Dyspnea with minimal activity and hypoxia persist --c/n iv  Rocephin and azithromycin, mucolytics, bronchodilators and supplemental oxygen as needed  2) severe sepsis secondary to bilateral pneumonia --- with  increased oxygen requirement  ----patient did Meet criteria for severe sepsis on admission with tachycardia up to HR of 116 at 3:54 AM on 04/15/2020, with tachypnea up to RR of 27 at 3:53 AM on 04/15/2020, leukocytosis, worsening hypoxia (acute on chronic hypoxic respiratory failure ) and clinical and imaging findings of bilateral pneumonia ---Treat as above #1  3)PAFib with RVR--- Echo with EF of 60 to 65%, -CHA2DS2-VASc score 5, annual stroke risk 7.2 -however patient's  HAS BLED score is 2 and history of recurrent falls -Risk versus benefits of FULL anticoagulation discussed with patient and her friend -They have decided not to do for anticoagulation at this time  4)Ambulatory dysfunction/back pain/recurrent falls----PTA patient lived alone, does not have caregivers on 24/7 supervision,  --PT/OT recommends SNF rehab  5) history of prior stroke--aspirin and Lipitor as ordered  6)COPD--- able, continue bronchodilators as above #1  6) acute on chronic hypoxic respiratory failure--- secondary to #1 above -PTA patient was on 2 L of oxygen via nasal cannula, patient has required up to 4 L this admission -Oxygen requirement getting close to baseline which is 2 L  Disposition/Need for in-Hospital Stay- patient unable to be discharged at this time due to --- pneumonia with hypoxia requiring IV antibiotics and supplemental oxygen* --Patient verbalizing concerns that she has very limited list of SNF facilities that she would be willing to go to -  Patient's neighbor is concerned that patient cannot return home safely she lives alone and is very high risk for falling if discharged home alone  Status is: Inpatient  Remains inpatient appropriate because:- pneumonia requiring IV antibiotics and supplemental oxygen*   Disposition: The patient is from: Home              Anticipated d/c is to: SNF              Anticipated d/c date is: 1 day              Patient currently is not medically stable to  d/c. Barriers: Not Clinically Stable- -- pneumonia requiring IV antibiotics and supplemental oxygen ---Patient verbalizing concerns that she has very limited list of SNF facilities that she would be willing to go to -Patient's neighbor is concerned that patient cannot return home safely she lives alone and is very high risk for falling if discharged home alone  Code Status : Full  Family Communication:    (patient is alert, awake and coherent)   Consults  :  na  DVT Prophylaxis  :  Lovenox - - SCDs    Lab Results  Component Value Date   PLT 167 04/18/2020   Inpatient Medications  Scheduled Meds: . aspirin EC  325 mg Oral q morning - 10a  . atorvastatin  20 mg Oral Daily  . dextromethorphan-guaiFENesin  1 tablet Oral BID  . enoxaparin (LOVENOX) injection  40 mg Subcutaneous Q24H  . guaiFENesin  600 mg Oral BID  . ipratropium-albuterol  3 mL Nebulization TID  . mometasone-formoterol  2 puff Inhalation BID  . omega-3 acid ethyl esters  1 g Oral Daily   Continuous Infusions: . azithromycin 500 mg (04/18/20 0844)  . cefTRIAXone (ROCEPHIN)  IV 2 g (04/18/20 1118)   PRN Meds:.albuterol, HYDROcodone-acetaminophen  Anti-infectives (From admission, onward)   Start     Dose/Rate Route Frequency Ordered Stop   04/15/20 0800  cefTRIAXone (ROCEPHIN) 2 g in sodium chloride 0.9 % 100 mL IVPB     Discontinue     2 g 200 mL/hr over 30 Minutes Intravenous Every 24 hours 04/14/20 2004 04/20/20 0759   04/15/20 0800  azithromycin (ZITHROMAX) 500 mg in sodium chloride 0.9 % 250 mL IVPB     Discontinue     500 mg 250 mL/hr over 60 Minutes Intravenous Every 24 hours 04/14/20 2004 04/20/20 0759   04/14/20 2000  azithromycin (ZITHROMAX) 500 mg in sodium chloride 0.9 % 250 mL IVPB        500 mg 250 mL/hr over 60 Minutes Intravenous  Once 04/14/20 1951 04/14/20 2338   04/14/20 1930  cefTRIAXone (ROCEPHIN) 1 g in sodium chloride 0.9 % 100 mL IVPB        1 g 200 mL/hr over 30 Minutes Intravenous   Once 04/14/20 1921 04/14/20 2109        Objective:   Vitals:   04/18/20 0422 04/18/20 0707 04/18/20 1331 04/18/20 1344  BP: 109/63   133/76  Pulse: 85   80  Resp: 16   20  Temp: 98.9 F (37.2 C)   97.7 F (36.5 C)  TempSrc:      SpO2: 92% 94% 95% 97%  Weight:      Height:        Wt Readings from Last 3 Encounters:  04/14/20 99.8 kg  04/15/19 82.6 kg  03/25/19 81.6 kg    Intake/Output Summary (Last 24 hours) at 04/18/2020 1737 Last data  filed at 04/18/2020 0306 Gross per 24 hour  Intake --  Output 300 ml  Net -300 ml    Physical Exam  Gen:- Awake Alert,  DOE HEENT:- Meta.AT, No sclera icterus 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  Lungs-diminished breath sounds with rhonchi, no wheezing  CV- S1, S2 normal, regular  Abd-  +ve B.Sounds, Abd Soft, No tenderness,    Extremity/Skin:- No  edema, pedal pulses present  Psych-affect is appropriate, oriented x3 Neuro-generalized, no new focal deficits, no tremors   Data Review:   Micro Results Recent Results (from the past 240 hour(s))  SARS Coronavirus 2 by RT PCR (hospital order, performed in [facility] hospital lab) Nasopharyngeal Nasopharyngeal Swab     Status: None   Collection Time: 04/14/20  5:00 PM   Specimen: Nasopharyngeal Swab  Result Value Ref Range Status   SARS Coronavirus 2 NEGATIVE NEGATIVE Final    Comment: (NOTE) SARS-CoV-2 target nucleic acids are NOT DETECTED.  The SARS-CoV-2 RNA is generally detectable in upper and lower respiratory specimens during the acute phase of infection. The lowest concentration of SARS-CoV-2 viral copies this assay can detect is 250 copies / mL. A negative result does not preclude SARS-CoV-2 infection and should not be used as the sole basis for treatment or other patient management decisions.  A negative result may occur with improper specimen collection / handling, submission of specimen other than nasopharyngeal swab, presence of viral  mutation(s) within the areas targeted by this assay, and inadequate number of viral copies (<250 copies / mL). A negative result must be combined with clinical observations, patient history, and epidemiological information.  Fact Sheet for Patients:   StrictlyIdeas.no  Fact Sheet for Healthcare Providers: BankingDealers.co.za  This test is not yet approved or  cleared by the [place] FDA and has been authorized for detection and/or diagnosis of SARS-CoV-2 by FDA under an Emergency Use Authorization (EUA).  This EUA will remain in effect (meaning this test can be used) for the duration of the COVID-19 declaration under Section 564(b)(1) of the Act, 21 U.S.C. section 360bbb-3(b)(1), unless the authorization is terminated or revoked sooner.  Performed at [facility], [address], [place], [place] [address]   Culture, blood (Routine X 2) w Reflex to ID Panel     Status: None (Preliminary result)   Collection Time: 04/15/20 12:33 AM   Specimen: BLOOD  Result Value Ref Range Status   Specimen Description BLOOD LEFT ANTECUBITAL  Final   Special Requests   Final    BOTTLES DRAWN AEROBIC AND ANAEROBIC Blood Culture adequate volume   Culture   Final    NO GROWTH 3 DAYS Performed at [facility], [address] [place], [place] [address]    Report Status PENDING  Incomplete  Culture, blood (Routine X 2) w Reflex to ID Panel     Status: None (Preliminary result)   Collection Time: 04/15/20 12:45 AM   Specimen: BLOOD LEFT HAND  Result Value Ref Range Status   Specimen Description BLOOD LEFT HAND  Final   Special Requests   Final    BOTTLES DRAWN AEROBIC AND ANAEROBIC Blood Culture adequate volume   Culture   Final    NO GROWTH 3 DAYS Performed at [facility], [address] [place], [place] [address]    Report Status PENDING  Incomplete  SARS Coronavirus 2 by RT PCR (hospital order, performed in [facility] hospital lab)  Nasopharyngeal Nasopharyngeal Swab     Status: None  Collection Time: 04/18/20  2:00 PM   Specimen: Nasopharyngeal Swab  Result Value Ref Range Status   SARS Coronavirus 2 NEGATIVE NEGATIVE Final    Comment: (NOTE) SARS-CoV-2 target nucleic acids are NOT DETECTED.  The SARS-CoV-2 RNA is generally detectable in upper and lower respiratory specimens during the acute phase of infection. The lowest concentration of SARS-CoV-2 viral copies this assay can detect is 250 copies / mL. A negative result does not preclude SARS-CoV-2 infection and should not be used as the sole basis for treatment or other patient management decisions.  A negative result may occur with improper specimen collection / handling, submission of specimen other than nasopharyngeal swab, presence of viral mutation(s) within the areas targeted by this assay, and inadequate number of viral copies (<250 copies / mL). A negative result must be combined with clinical observations, patient history, and epidemiological information.  Fact Sheet for Patients:   StrictlyIdeas.no  Fact Sheet for Healthcare Providers: BankingDealers.co.za  This test is not yet approved or  cleared by the [place] FDA and has been authorized for detection and/or diagnosis of SARS-CoV-2 by FDA under an Emergency Use Authorization (EUA).  This EUA will remain in effect (meaning this test can be used) for the duration of the COVID-19 declaration under Section 564(b)(1) of the Act, 21 U.S.C. section 360bbb-3(b)(1), unless the authorization is terminated or revoked sooner.  Performed at [facility], [address], [place], [place] [address]     Radiology Reports CT Head Wo Contrast  Result Date: 04/14/2020 CLINICAL DATA:  79 year old female with head trauma. EXAM: CT HEAD WITHOUT CONTRAST TECHNIQUE: Contiguous axial images were obtained from the base of the skull through the vertex without  intravenous contrast. COMPARISON:  Head CT dated 01/30/2019. FINDINGS: Evaluation of this exam is limited due to motion artifact. Brain: Mild age-related atrophy and chronic microvascular ischemic changes. There is no acute intracranial hemorrhage. No mass effect or midline shift no extra-axial fluid collection. Vascular: No hyperdense vessel or unexpected calcification. Skull: Normal. Negative for fracture or focal lesion. Sinuses/Orbits: No acute finding. Other: None IMPRESSION: 1. No acute intracranial pathology. 2. Mild age-related atrophy and chronic microvascular ischemic changes. Electronically Signed   By: Anner Crete M.D.   On: 04/14/2020 20:55   DG Chest Portable 1 View  Result Date: 04/14/2020 CLINICAL DATA:  Right hip and knee pain.  Fall. EXAM: PORTABLE CHEST 1 VIEW COMPARISON:  05/20/2017 FINDINGS: Cardiomegaly. Right upper lobe and patchy left perihilar and lower lobe airspace opacities concerning for pneumonia. No effusions. No acute bony abnormality. IMPRESSION: Patchy bilateral airspace opacities concerning for multifocal pneumonia. Electronically Signed   By: Rolm Baptise M.D.   On: 04/14/2020 18:35   DG Knee Complete 4 Views Right  Result Date: 04/14/2020 CLINICAL DATA:  Fall, right knee pain EXAM: RIGHT KNEE - COMPLETE 4+ VIEW COMPARISON:  None. FINDINGS: Moderate tricompartment degenerative changes with joint space narrowing and spurring. Small joint effusion. No acute bony abnormality. Specifically, no fracture, subluxation, or dislocation. Vascular calcifications noted. IMPRESSION: Moderate degenerative changes. Small joint effusion. No acute bony abnormality. Electronically Signed   By: Rolm Baptise M.D.   On: 04/14/2020 18:36   ECHOCARDIOGRAM COMPLETE  Result Date: 04/15/2020    ECHOCARDIOGRAM REPORT   Patient Name:   KENSLI BOWLEY [facility] Date of Exam: 04/15/2020 Medical Rec #:  [id number]       Height:       67.0 in Accession #:    [id number]      Weight:  220.0 lb Date of  Birth:  Aug 01, 1941       BSA:          2.106 m Patient Age:    63 years        BP:           100/69 mmHg Patient Gender: F               HR:           97 bpm. Exam Location:  [facility] Procedure: 2D Echo Indications:    Atrial Fibrillation 427.31 / I48.91  History:        Patient has prior history of Echocardiogram examinations, most                 recent 05/22/2017. COPD and Stroke, Arrythmias:Atrial                 Fibrillation, Signs/Symptoms:Syncope; Risk Factors:Current                 Smoker. GERD.  Sonographer:    Leavy Cella RDCS (AE) Referring Phys: [id number] OLADAPO ADEFESO IMPRESSIONS  1. Left ventricular ejection fraction, by estimation, is 60 to 65%. The left ventricle has normal function. The left ventricle has no regional wall motion abnormalities. There is mild left ventricular hypertrophy. Left ventricular diastolic parameters are indeterminate.  2. Right ventricular systolic function is moderately reduced. The right ventricular size is severely enlarged. There is moderately elevated pulmonary artery systolic pressure.  3. Left atrial size was severely dilated.  4. Right atrial size was severely dilated.  5. The mitral valve is normal in structure. No evidence of mitral valve regurgitation. No evidence of mitral stenosis.  6. The aortic valve is tricuspid. Aortic valve regurgitation is not visualized. No aortic stenosis is present.  7. The inferior vena cava is dilated in size with <50% respiratory variability, suggesting right atrial pressure of 15 mmHg. FINDINGS  Left Ventricle: Left ventricular ejection fraction, by estimation, is 60 to 65%. The left ventricle has normal function. The left ventricle has no regional wall motion abnormalities. The left ventricular internal cavity size was normal in size. There is  mild left ventricular hypertrophy. Left ventricular diastolic parameters are indeterminate. Right Ventricle: The right ventricular size is severely enlarged. Right vetricular wall  thickness was not assessed. Right ventricular systolic function is moderately reduced. There is moderately elevated pulmonary artery systolic pressure. The tricuspid regurgitant velocity is 3.19 m/s, and with an assumed right atrial pressure of 10 mmHg, the estimated right ventricular systolic pressure is 09.3 mmHg. Left Atrium: Left atrial size was severely dilated. Right Atrium: Right atrial size was severely dilated. Pericardium: There is no evidence of pericardial effusion. Mitral Valve: The mitral valve is normal in structure. There is mild thickening of the mitral valve leaflet(s). There is mild calcification of the mitral valve leaflet(s). Mild mitral annular calcification. No evidence of mitral valve regurgitation. No evidence of mitral valve stenosis. Tricuspid Valve: The tricuspid valve is normal in structure. Tricuspid valve regurgitation is mild . No evidence of tricuspid stenosis. Aortic Valve: The aortic valve is tricuspid. . There is mild thickening and mild calcification of the aortic valve. Aortic valve regurgitation is not visualized. No aortic stenosis is present. Mild aortic valve annular calcification. There is mild thickening of the aortic valve. There is mild calcification of the aortic valve. Aortic valve mean gradient measures 3.4 mmHg. Aortic valve peak gradient measures 6.2 mmHg. Aortic valve area, by VTI measures 2.36 cm.  Pulmonic Valve: The pulmonic valve was not well visualized. Pulmonic valve regurgitation is not visualized. No evidence of pulmonic stenosis. Aorta: The aortic root is normal in size and structure. Pulmonary Artery: Moderate pulmonary HTN, PASP 56 mmHg. Venous: The inferior vena cava is dilated in size with less than 50% respiratory variability, suggesting right atrial pressure of 15 mmHg. IAS/Shunts: No atrial level shunt detected by color flow Doppler.  LEFT VENTRICLE PLAX 2D LVIDd:         4.24 cm  Diastology LVIDs:         2.38 cm  LV e' lateral:   10.20 cm/s LV PW:          1.25 cm  LV E/e' lateral: 11.4 LV IVS:        1.27 cm  LV e' medial:    8.25 cm/s LVOT diam:     2.20 cm  LV E/e' medial:  14.1 LV SV:         52 LV SV Index:   25 LVOT Area:     3.80 cm  RIGHT VENTRICLE RV S prime:     9.49 cm/s TAPSE (M-mode): 1.8 cm LEFT ATRIUM             Index       RIGHT ATRIUM           Index LA diam:        5.10 cm 2.42 cm/m  RA Area:     34.10 cm LA Vol (A2C):   68.4 ml 32.48 ml/m RA Volume:   137.00 ml 65.06 ml/m LA Vol (A4C):   90.8 ml 43.12 ml/m LA Biplane Vol: 81.2 ml 38.56 ml/m  AORTIC VALVE AV Area (Vmax):    2.41 cm AV Area (Vmean):   1.91 cm AV Area (VTI):     2.36 cm AV Vmax:           124.88 cm/s AV Vmean:          87.334 cm/s AV VTI:            0.221 m AV Peak Grad:      6.2 mmHg AV Mean Grad:      3.4 mmHg LVOT Vmax:         79.27 cm/s LVOT Vmean:        43.795 cm/s LVOT VTI:          0.137 m LVOT/AV VTI ratio: 0.62  AORTA Ao Root diam: 2.80 cm MITRAL VALVE                TRICUSPID VALVE MV Area (PHT): 4.36 cm     TR Peak grad:   40.7 mmHg MV Decel Time: 174 msec     TR Vmax:        319.00 cm/s MV E velocity: 116.00 cm/s                             SHUNTS                             Systemic VTI:  0.14 m                             Systemic Diam: 2.20 cm Carlyle Dolly MD Electronically signed by Carlyle Dolly MD Signature Date/Time: 04/15/2020/3:51:48 PM    Final    DG  HIP UNILAT W OR W/O PELVIS 2-3 VIEWS RIGHT  Result Date: 04/14/2020 CLINICAL DATA:  Fall, right hip pain EXAM: DG HIP (WITH OR WITHOUT PELVIS) 2-3V RIGHT COMPARISON:  None. FINDINGS: Mild symmetric degenerative changes in the hips. Postoperative changes in the visualized lower lumbar spine. SI joints symmetric and unremarkable. No acute bony abnormality. Specifically, no fracture, subluxation, or dislocation. IMPRESSION: No acute bony abnormality. Electronically Signed   By: Rolm Baptise M.D.   On: 04/14/2020 18:35     CBC Recent Labs  Lab 04/14/20 1733 04/16/20 0609 04/18/20 0725   WBC 20.8* 13.6* 6.7  HGB 12.6 11.0* 10.9*  HCT 41.9 36.3 36.0  PLT 180 166 167  MCV 98.8 97.8 97.8  MCH 29.7 29.6 29.6  MCHC 30.1 30.3 30.3  RDW 14.3 14.2 14.2  LYMPHSABS 0.7  --   --   MONOABS 0.9  --   --   EOSABS 0.0  --   --   BASOSABS 0.1  --   --     Chemistries  Recent Labs  Lab 04/14/20 1733 04/16/20 0609 04/18/20 0725  NA 137 138 138  K 3.8 3.5 3.1*  CL 100 101 103  CO2 27 26 27   GLUCOSE 124* 100* 96  BUN 18 15 13   CREATININE 0.73 0.56 0.44  CALCIUM 8.3* 8.7* 8.5*   ------------------------------------------------------------------------------------------------------------------ No results for input(s): CHOL, HDL, LDLCALC, TRIG, CHOLHDL, LDLDIRECT in the last 72 hours.  Lab Results  Component Value Date   HGBA1C 5.8 (H) 07/31/2013   ------------------------------------------------------------------------------------------------------------------ No results for input(s): TSH, T4TOTAL, T3FREE, THYROIDAB in the last 72 hours.  Invalid input(s): FREET3 ------------------------------------------------------------------------------------------------------------------ No results for input(s): VITAMINB12, FOLATE, FERRITIN, TIBC, IRON, RETICCTPCT in the last 72 hours.  Coagulation profile No results for input(s): INR, PROTIME in the last 168 hours.  No results for input(s): DDIMER in the last 72 hours.  Cardiac Enzymes No results for input(s): CKMB, TROPONINI, MYOGLOBIN in the last 168 hours.  Invalid input(s): CK ------------------------------------------------------------------------------------------------------------------ No results found for: BNP   Roxan Hockey M.D on 04/18/2020 at 5:37 PM  Go to [email or web address] - for contact info  [facility] - Office  [phone]

## 2020-04-19 DIAGNOSIS — H548 Legal blindness, as defined in USA: Secondary | ICD-10-CM | POA: Diagnosis present

## 2020-04-19 MED ORDER — ASPIRIN EC 325 MG PO TBEC: 325.0000 mg | DELAYED_RELEASE_TABLET | Freq: Every day | ORAL | 3 refills | Status: DC

## 2020-04-19 MED ORDER — GUAIFENESIN ER 600 MG PO TB12: 600.0000 mg | ORAL_TABLET | Freq: Two times a day (BID) | ORAL | 0 refills | Status: DC

## 2020-04-19 MED ORDER — ATORVASTATIN CALCIUM 20 MG PO TABS: 20.0000 mg | ORAL_TABLET | Freq: Every day | ORAL | 3 refills | Status: DC

## 2020-04-19 MED ORDER — AZITHROMYCIN 500 MG PO TABS
500.0000 mg | ORAL_TABLET | Freq: Every day | ORAL | 0 refills | Status: AC
Start: 2020-04-19 — End: 2020-04-21

## 2020-04-19 MED ORDER — CEFDINIR 300 MG PO CAPS
300.0000 mg | ORAL_CAPSULE | Freq: Two times a day (BID) | ORAL | 0 refills | Status: AC
Start: 2020-04-19 — End: 2020-04-22

## 2020-04-19 MED ORDER — ALBUTEROL SULFATE HFA 108 (90 BASE) MCG/ACT IN AERS: 2.0000 | INHALATION_SPRAY | Freq: Four times a day (QID) | RESPIRATORY_TRACT | 1 refills | Status: AC | PRN

## 2020-04-19 MED ORDER — ADVAIR DISKUS 250-50 MCG/DOSE IN AEPB: 1.0000 | INHALATION_SPRAY | Freq: Two times a day (BID) | RESPIRATORY_TRACT | 2 refills | Status: AC

## 2020-04-19 NOTE — TOC Transition Note (Addendum)
Transition of Care [facility]) - CM/SW Discharge Note   Patient Details  Name: Meagan Holt MRN: [id number] Date of Birth: [birth year]  [facility] of [facility]) CM/SW Contact:  Salome Arnt, LCSW Phone Number: 04/19/2020, 1:53 PM   Clinical Narrative:  Pt d/c today. [facility] unable to offer bed. Pt states she wants to return home with home health. Discussed PT, RN, SW, and aid and pt is agreeable. Offered choice and pt agreeable to refer to [facility]. Linda notified. Awaiting orders. Pt plans to call neighbor for ride home. Pt indicates she has all needed equipment already at home.      Final next level of care: [place] Barriers to Discharge: Barriers Resolved   Patient Goals and CMS Choice Patient states their goals for this hospitalization and ongoing recovery are:: get better CMS Medicare.gov Compare Post Acute Care list provided to:: Patient Choice offered to / list presented to : Patient  Discharge Placement                  Name of family member notified: Pt states she will notify family/friends. Patient and family notified of of transfer: 04/19/20  Discharge Plan and Services In-house Referral: Clinical Social Work   Post Acute Care Choice: Skilled Nursing Facility                    HH Arranged: RN, PT, OT, Refused SNF, Social Work, Nurse's Aide [place] Agency: [place] ([place]) Date [place]: 04/19/20 Time [place]: [place] Representative spoke with at [place]: [place] Determinants of Health (SDOH) Interventions     Readmission Risk Interventions Readmission Risk Prevention Plan 04/16/2020  Medication Screening Complete  Transportation Screening Complete  Some recent data might be hidden

## 2020-04-19 NOTE — Discharge Summary (Addendum)
Meagan Holt, is a 79 y.o. female  DOB March 16, 1941  MRN [id number].  Admission date:  04/14/2020  Admitting Physician  [name], DO  Discharge Date:  04/19/2020   Primary MD  [name], MD  Recommendations for primary care physician for things to follow:   1)Avoid ibuprofen/Advil/Aleve/Motrin/Goody Powders/Naproxen/BC powders/Meloxicam/Diclofenac/Indomethacin and other Nonsteroidal anti-inflammatory medications as these will make you more likely to bleed and can cause stomach ulcers, can also cause Kidney problems.   2)you need oxygen at home at 2 L via nasal cannula continuously while awake and while asleep--- smoking or having open fires around oxygen can cause fire, significant injury and death  3) you have refused to go to a skilled nursing facility for physical therapy rehab--- you have decided to go home despite your increasing risk for falling at home --A physical therapist to come to your house a couple times a week to work with you to help you get stronger, please get that much help as you can at home to help you with your daily activities of living --Due to your visual impairment , your use of narcotic pain medications and the need for oxygen with long cord you at high risk for falling at home  Admission Diagnosis  Weakness [R53.1] CAP (community acquired pneumonia) [J18.9] Fall, initial encounter [W19.XXXA] Community acquired pneumonia, unspecified laterality [J18.9]   Discharge Diagnosis  Weakness [R53.1] CAP (community acquired pneumonia) [J18.9] Fall, initial encounter [W19.XXXA] Community acquired pneumonia, unspecified laterality [J18.9]    Principal Problem:   Severe sepsis due to bilateral pneumonia with acute on chronic hypoxic respiratory failure Active Problems:   Legally blind   COPD (chronic obstructive pulmonary disease) (HCC)   History of stroke   CAP (community  acquired pneumonia)   Acute on chronic respiratory failure with hypoxia (HCC)   Recurrent falls   Atrial fibrillation with RVR (HCC)   Leukocytosis   Obesity (BMI 30.0-34.9)      Past Medical History:  Diagnosis Date  . Arthritis    "all my joints" (07/30/2013)  . Chronic back pain   . Chronic bronchitis ([place])    "get it q year" (07/30/2013)  . COPD (chronic obstructive pulmonary disease) ([place])   . Depression   . Falls frequently    "fell twice in the last 2 days" (07/30/2013)  . GERD (gastroesophageal reflux disease)    "rarely" (07/30/2013)  . H/O hiatal hernia   . History of blood transfusion   . Kidney stones   . Stroke [facility])     Past Surgical History:  Procedure Laterality Date  . APPENDECTOMY    . BACK SURGERY     "put cages in mid back; did arthroscopic OR on lower back" (11/326/2014)  . BILATERAL OOPHORECTOMY Bilateral   . CATARACT EXTRACTION W/ INTRAOCULAR LENS  IMPLANT, BILATERAL Bilateral   . CHOLECYSTECTOMY    . EXCISIONAL HEMORRHOIDECTOMY    . GASTRIC BYPASS    . HERNIA REPAIR     "removed my belly button too" (07/30/2013)  .  KIDNEY STONE SURGERY     "twice" (07/30/2013)  . KNEE ARTHROSCOPY Left   . TONSILLECTOMY         HPI  from the history and physical done on the day of admission:     Chief Complaint: Fall  HPI: Meagan Holt is a 79 y.o. female with medical history significant for arthritis, chronic back pain, COPD on supplemental oxygen via [place] at 2 LPM at night, CVA and frequent mechanical falls who presents to the emergency department via EMS due to fall sustained at home.  Patient states that she sustained a fall this morning around 6:30 AM from a standing position and landed on a carpet at her home, she had difficulty in being able to get up, so patient was on the floor until her neighbor who walks her dog checked on her in the afternoon and activated EMS.  Patient states that her legs gave up on her while ambulating with a walker.  She  complained of right hip, knee pain and multiple abrasions on her arms.  Patient endorsed several days of productive cough (she was unable to tell me the color of sputum due to vision problems), she was not sure if she had any fever.  Patient continues to smoke cigarettes and denies increased shortness of breath.  ED Course: In the emergency department, she was intermittently tachypneic and was noted to be hypoxic with O2 sat of 87% on room air (only use supplemental oxygen at bedtime at home).  Work-up in the ED showed leukocytosis, hyperglycemia, SARS coronavirus was negative, urinalysis was positive for moderate hemoglobin urine dipstick, protein 30..  CT of head without contrast showed no acute intracranial process pathology.  Right knee x-ray showed moderate degenerative changes with small joint effusion but no acute bony abnormality.  Right hip x-ray showed no acute bony abnormality.  Chest x-ray showed patchy bilateral airspace opacities concerning for multifocal pneumonia.  Patient was started on IV ceftriaxone and azithromycin due to presumed community-acquired pneumonia.  Hospitalist was asked to admit patient for further evaluation and management.    Hospital Course:   Brief Summary:- 79 y.o.femalewith medical history significant forarthritis, chronic back pain, COPD on supplemental oxygen via [place] at 2 LPM at night, CVA and frequent mechanical falls admitted on 04/14/2020 after mechanical fall at home and found to have bilateral pneumonia with acute hypoxic respiratory failure -Patient did Meet criteria for severe sepsis on admission with tachycardia up to HR of 116 at 3:54 AM on 04/15/2020, with tachypnea up to RR of 27 at 3:53 AM on 04/15/2020, leukocytosis, worsening hypoxia (acute on chronic hypoxic respiratory failure ) and clinical and imaging findings of bilateral pneumonia  A/p 1)Bilateral Pneumonia---POA-- ---cough and shortness of breath improved  Oxygen requirement is back to  baseline 2 L/min Treated with Rocephin and Azithromycin, mucolytics, bronchodilators and supplemental oxygen --Discharge on Omnicef and azithromycin  2)Severe Sepsis secondary to Bilateral P neumonia --- with increased oxygen requirement  ----patient did Meet criteria for severe sepsis on admission with tachycardia up to HR of 116 at 3:54 AM on 04/15/2020, with tachypnea up to RR of 27 at 3:53 AM on 04/15/2020, leukocytosis, worsening hypoxia (acute on chronic hypoxic respiratory failure ) and clinical and imaging findings of bilateral pneumonia -Sepsis pathophysiology has resolved ---Treat as above #1  3)PAFib with RVR--- Echo with EF of 60 to 65%, -CHA2DS2-VASc score 5, annual stroke risk 7.2 -however patient's  HAS BLED score is 2 and history of recurrent falls -Risk Versus  Benefits of FULL anticoagulation discussed with patient and her friend -They have decided not to do for anticoagulation at this time  4)Ambulatory Dysfunction/back pain/recurrent falls----PTA patient lived alone, does not have caregivers on 24/7 supervision,  --PT/OT recommends SNF rehab- Patient refused to go to a skilled nursing facility for physical therapy rehab--- she decided to go home despite increasing risk for falling at home ----Due to your visual impairment ,  use of narcotic pain medications and the need for oxygen with long cord pt is  at high risk for falling at home -Patient declines SNF rehab  5)History of Prior stroke--aspirin and Lipitor as ordered  6)COPD--- stable, continue bronchodilators as above #1  6) acute on chronic hypoxic respiratory failure--- secondary to #1 above -PTA patient was on 2 L of oxygen via nasal cannula, patient has required up to 4 L this admission -Oxygen requirement back  to baseline which is 2 L  Disposition----dc home with [facility]  Code Status : Full  Family Communication:    (patient is alert, awake and coherent)   Consults  :  na  Discharge Condition:  stable  Follow UP   Follow-up Information    [facility].   Specialty: Home Health Services Why: Will follow up to arrange home health services.               Consults obtained - na  Diet and Activity recommendation:  As advised  Discharge Instructions    Discharge Instructions    Call MD for:  difficulty breathing, headache or visual disturbances   Complete by: As directed    Call MD for:  persistant dizziness or light-headedness   Complete by: As directed    Call MD for:  persistant nausea and vomiting   Complete by: As directed    Call MD for:  temperature >100.4   Complete by: As directed    Diet - low sodium heart healthy   Complete by: As directed    Discharge instructions   Complete by: As directed    1)Avoid ibuprofen/Advil/Aleve/Motrin/Goody Powders/Naproxen/BC powders/Meloxicam/Diclofenac/Indomethacin and other Nonsteroidal anti-inflammatory medications as these will make you more likely to bleed and can cause stomach ulcers, can also cause Kidney problems.   2)you need oxygen at home at 2 L via nasal cannula continuously while awake and while asleep--- smoking or having open fires around oxygen can cause fire, significant injury and death  3) you have refused to go to a skilled nursing facility for physical therapy rehab--- you have decided to go home despite your increasing risk for falling at home --A physical therapist to come to your house a couple times a week to work with you to help you get stronger, please get that much help as you can at home to help you with your daily activities of living --Due to your visual impairment , your use of narcotic pain medications and the need for oxygen with long cord you at high risk for falling at home   Increase activity slowly   Complete by: As directed    No wound care   Complete by: As directed         Discharge Medications     Allergies as of 04/19/2020      Reactions   Latex  Itching, Swelling   Doxycycline Rash      Medication List    STOP taking these medications   ibuprofen 600 MG tablet Commonly known as: ADVIL  TAKE these medications   Advair Diskus 250-50 MCG/DOSE Aepb Generic drug: Fluticasone-Salmeterol Inhale 1 puff into the lungs 2 (two) times daily.   albuterol 108 (90 Base) MCG/ACT inhaler Commonly known as: VENTOLIN HFA Inhale 2 puffs into the lungs every 6 (six) hours as needed for wheezing or shortness of breath. What changed: how much to take   aspirin EC 325 MG tablet Take 1 tablet (325 mg total) by mouth daily with breakfast. What changed: when to take this   atorvastatin 20 MG tablet Commonly known as: LIPITOR Take 1 tablet (20 mg total) by mouth daily. Start taking on: April 20, 2020   azithromycin 500 MG tablet Commonly known as: ZITHROMAX Take 1 tablet (500 mg total) by mouth daily for 2 days.   buPROPion 300 MG 24 hr tablet Commonly known as: WELLBUTRIN XL Take 300 mg by mouth at bedtime.   cefdinir 300 MG capsule Commonly known as: OMNICEF Take 1 capsule (300 mg total) by mouth 2 (two) times daily for 3 days.   Fish Oil 1000 MG Caps Take 3 capsules by mouth daily.   FLUoxetine 10 MG capsule Commonly known as: PROZAC Take 1 capsule by mouth every morning.   furosemide 20 MG tablet Commonly known as: LASIX Take 20 mg by mouth daily as needed for fluid or edema.   gabapentin 600 MG tablet Commonly known as: NEURONTIN Take 600 mg by mouth 3 (three) times daily.   guaiFENesin 600 MG 12 hr tablet Commonly known as: MUCINEX Take 1 tablet (600 mg total) by mouth 2 (two) times daily.   HYDROcodone Bitartrate ER 30 MG T24a Take 1 tablet by mouth daily.   HYDROcodone-acetaminophen 10-325 MG tablet Commonly known as: NORCO Take 1 tablet by mouth 3 (three) times daily as needed for pain.   ICAPS PO Take 1 tablet by mouth daily.   multivitamin with minerals Tabs tablet Take 1 tablet by mouth daily.     Systane 0.4-0.3 % Soln Generic drug: Polyethyl Glycol-Propyl Glycol Apply 1-2 drops to eye daily as needed (for dry eye relief).   zolpidem 5 MG tablet Commonly known as: AMBIEN Take 5 mg by mouth at bedtime.      Major procedures and Radiology Reports - PLEASE review detailed and final reports for all details, in brief -   CT Head Wo Contrast  Result Date: 04/14/2020 CLINICAL DATA:  79 year old female with head trauma. EXAM: CT HEAD WITHOUT CONTRAST TECHNIQUE: Contiguous axial images were obtained from the base of the skull through the vertex without intravenous contrast. COMPARISON:  Head CT dated 01/30/2019. FINDINGS: Evaluation of this exam is limited due to motion artifact. Brain: Mild age-related atrophy and chronic microvascular ischemic changes. There is no acute intracranial hemorrhage. No mass effect or midline shift no extra-axial fluid collection. Vascular: No hyperdense vessel or unexpected calcification. Skull: Normal. Negative for fracture or focal lesion. Sinuses/Orbits: No acute finding. Other: None IMPRESSION: 1. No acute intracranial pathology. 2. Mild age-related atrophy and chronic microvascular ischemic changes. Electronically Signed   By: Anner Crete M.D.   On: 04/14/2020 20:55   DG Chest Portable 1 View  Result Date: 04/14/2020 CLINICAL DATA:  Right hip and knee pain.  Fall. EXAM: PORTABLE CHEST 1 VIEW COMPARISON:  05/20/2017 FINDINGS: Cardiomegaly. Right upper lobe and patchy left perihilar and lower lobe airspace opacities concerning for pneumonia. No effusions. No acute bony abnormality. IMPRESSION: Patchy bilateral airspace opacities concerning for multifocal pneumonia. Electronically Signed   By: Rolm Baptise M.D.  On: 04/14/2020 18:35   DG Knee Complete 4 Views Right  Result Date: 04/14/2020 CLINICAL DATA:  Fall, right knee pain EXAM: RIGHT KNEE - COMPLETE 4+ VIEW COMPARISON:  None. FINDINGS: Moderate tricompartment degenerative changes with joint space  narrowing and spurring. Small joint effusion. No acute bony abnormality. Specifically, no fracture, subluxation, or dislocation. Vascular calcifications noted. IMPRESSION: Moderate degenerative changes. Small joint effusion. No acute bony abnormality. Electronically Signed   By: Rolm Baptise M.D.   On: 04/14/2020 18:36   ECHOCARDIOGRAM COMPLETE  Result Date: 04/15/2020    ECHOCARDIOGRAM REPORT   Patient Name:   Meagan Holt [facility] Date of Exam: 04/15/2020 Medical Rec #:  [id number]       Height:       67.0 in Accession #:    [id number]      Weight:       220.0 lb Date of Birth:  [birth year]       BSA:          2.106 m Patient Age:    4 years        BP:           100/69 mmHg Patient Gender: F               HR:           97 bpm. Exam Location:  [facility] Procedure: 2D Echo Indications:    Atrial Fibrillation 427.31 / I48.91  History:        Patient has prior history of Echocardiogram examinations, most                 recent 05/22/2017. COPD and Stroke, Arrythmias:Atrial                 Fibrillation, Signs/Symptoms:Syncope; Risk Factors:Current                 Smoker. GERD.  Sonographer:    Leavy Cella RDCS (AE) Referring Phys: [id number] OLADAPO ADEFESO IMPRESSIONS  1. Left ventricular ejection fraction, by estimation, is 60 to 65%. The left ventricle has normal function. The left ventricle has no regional wall motion abnormalities. There is mild left ventricular hypertrophy. Left ventricular diastolic parameters are indeterminate.  2. Right ventricular systolic function is moderately reduced. The right ventricular size is severely enlarged. There is moderately elevated pulmonary artery systolic pressure.  3. Left atrial size was severely dilated.  4. Right atrial size was severely dilated.  5. The mitral valve is normal in structure. No evidence of mitral valve regurgitation. No evidence of mitral stenosis.  6. The aortic valve is tricuspid. Aortic valve regurgitation is not visualized. No aortic stenosis is present.   7. The inferior vena cava is dilated in size with <50% respiratory variability, suggesting right atrial pressure of 15 mmHg. FINDINGS  Left Ventricle: Left ventricular ejection fraction, by estimation, is 60 to 65%. The left ventricle has normal function. The left ventricle has no regional wall motion abnormalities. The left ventricular internal cavity size was normal in size. There is  mild left ventricular hypertrophy. Left ventricular diastolic parameters are indeterminate. Right Ventricle: The right ventricular size is severely enlarged. Right vetricular wall thickness was not assessed. Right ventricular systolic function is moderately reduced. There is moderately elevated pulmonary artery systolic pressure. The tricuspid regurgitant velocity is 3.19 m/s, and with an assumed right atrial pressure of 10 mmHg, the estimated right ventricular systolic pressure is 29.9 mmHg. Left Atrium: Left atrial size was severely dilated. Right Atrium: Right atrial  size was severely dilated. Pericardium: There is no evidence of pericardial effusion. Mitral Valve: The mitral valve is normal in structure. There is mild thickening of the mitral valve leaflet(s). There is mild calcification of the mitral valve leaflet(s). Mild mitral annular calcification. No evidence of mitral valve regurgitation. No evidence of mitral valve stenosis. Tricuspid Valve: The tricuspid valve is normal in structure. Tricuspid valve regurgitation is mild . No evidence of tricuspid stenosis. Aortic Valve: The aortic valve is tricuspid. . There is mild thickening and mild calcification of the aortic valve. Aortic valve regurgitation is not visualized. No aortic stenosis is present. Mild aortic valve annular calcification. There is mild thickening of the aortic valve. There is mild calcification of the aortic valve. Aortic valve mean gradient measures 3.4 mmHg. Aortic valve peak gradient measures 6.2 mmHg. Aortic valve area, by VTI measures 2.36 cm.  Pulmonic Valve: The pulmonic valve was not well visualized. Pulmonic valve regurgitation is not visualized. No evidence of pulmonic stenosis. Aorta: The aortic root is normal in size and structure. Pulmonary Artery: Moderate pulmonary HTN, PASP 56 mmHg. Venous: The inferior vena cava is dilated in size with less than 50% respiratory variability, suggesting right atrial pressure of 15 mmHg. IAS/Shunts: No atrial level shunt detected by color flow Doppler.  LEFT VENTRICLE PLAX 2D LVIDd:         4.24 cm  Diastology LVIDs:         2.38 cm  LV e' lateral:   10.20 cm/s LV PW:         1.25 cm  LV E/e' lateral: 11.4 LV IVS:        1.27 cm  LV e' medial:    8.25 cm/s LVOT diam:     2.20 cm  LV E/e' medial:  14.1 LV SV:         52 LV SV Index:   25 LVOT Area:     3.80 cm  RIGHT VENTRICLE RV S prime:     9.49 cm/s TAPSE (M-mode): 1.8 cm LEFT ATRIUM             Index       RIGHT ATRIUM           Index LA diam:        5.10 cm 2.42 cm/m  RA Area:     34.10 cm LA Vol (A2C):   68.4 ml 32.48 ml/m RA Volume:   137.00 ml 65.06 ml/m LA Vol (A4C):   90.8 ml 43.12 ml/m LA Biplane Vol: 81.2 ml 38.56 ml/m  AORTIC VALVE AV Area (Vmax):    2.41 cm AV Area (Vmean):   1.91 cm AV Area (VTI):     2.36 cm AV Vmax:           124.88 cm/s AV Vmean:          87.334 cm/s AV VTI:            0.221 m AV Peak Grad:      6.2 mmHg AV Mean Grad:      3.4 mmHg LVOT Vmax:         79.27 cm/s LVOT Vmean:        43.795 cm/s LVOT VTI:          0.137 m LVOT/AV VTI ratio: 0.62  AORTA Ao Root diam: 2.80 cm MITRAL VALVE                TRICUSPID VALVE MV Area (PHT): 4.36 cm  TR Peak grad:   40.7 mmHg MV Decel Time: 174 msec     TR Vmax:        319.00 cm/s MV E velocity: 116.00 cm/s                             SHUNTS                             Systemic VTI:  0.14 m                             Systemic Diam: 2.20 cm Carlyle Dolly MD Electronically signed by Carlyle Dolly MD Signature Date/Time: 04/15/2020/3:51:48 PM    Final    DG HIP UNILAT W OR W/O  PELVIS 2-3 VIEWS RIGHT  Result Date: 04/14/2020 CLINICAL DATA:  Fall, right hip pain EXAM: DG HIP (WITH OR WITHOUT PELVIS) 2-3V RIGHT COMPARISON:  None. FINDINGS: Mild symmetric degenerative changes in the hips. Postoperative changes in the visualized lower lumbar spine. SI joints symmetric and unremarkable. No acute bony abnormality. Specifically, no fracture, subluxation, or dislocation. IMPRESSION: No acute bony abnormality. Electronically Signed   By: Rolm Baptise M.D.   On: 04/14/2020 18:35   Micro Results   Recent Results (from the past 240 hour(s))  SARS Coronavirus 2 by RT PCR (hospital order, performed in [facility] hospital lab) Nasopharyngeal Nasopharyngeal Swab     Status: None   Collection Time: 04/14/20  5:00 PM   Specimen: Nasopharyngeal Swab  Result Value Ref Range Status   SARS Coronavirus 2 NEGATIVE NEGATIVE Final    Comment: (NOTE) SARS-CoV-2 target nucleic acids are NOT DETECTED.  The SARS-CoV-2 RNA is generally detectable in upper and lower respiratory specimens during the acute phase of infection. The lowest concentration of SARS-CoV-2 viral copies this assay can detect is 250 copies / mL. A negative result does not preclude SARS-CoV-2 infection and should not be used as the sole basis for treatment or other patient management decisions.  A negative result may occur with improper specimen collection / handling, submission of specimen other than nasopharyngeal swab, presence of viral mutation(s) within the areas targeted by this assay, and inadequate number of viral copies (<250 copies / mL). A negative result must be combined with clinical observations, patient history, and epidemiological information.  Fact Sheet for Patients:   StrictlyIdeas.no  Fact Sheet for Healthcare Providers: BankingDealers.co.za  This test is not yet approved or  cleared by the [place] FDA and has been authorized for detection and/or  diagnosis of SARS-CoV-2 by FDA under an Emergency Use Authorization (EUA).  This EUA will remain in effect (meaning this test can be used) for the duration of the COVID-19 declaration under Section 564(b)(1) of the Act, 21 U.S.C. section 360bbb-3(b)(1), unless the authorization is terminated or revoked sooner.  Performed at [facility], [address] [place], [place] [address]   Culture, blood (Routine X 2) w Reflex to ID Panel     Status: None (Preliminary result)   Collection Time: 04/15/20 12:33 AM   Specimen: BLOOD  Result Value Ref Range Status   Specimen Description BLOOD LEFT ANTECUBITAL  Final   Special Requests   Final    BOTTLES DRAWN AEROBIC AND ANAEROBIC Blood Culture adequate volume   Culture   Final    NO GROWTH 4 DAYS Performed at [facility], [address], [place], [place] [address]    Report Status PENDING  Incomplete  Culture, blood (Routine X 2) w Reflex to ID Panel     Status: None (Preliminary result)   Collection Time: 04/15/20 12:45 AM   Specimen: BLOOD LEFT HAND  Result Value Ref Range Status   Specimen Description BLOOD LEFT HAND  Final   Special Requests   Final    BOTTLES DRAWN AEROBIC AND ANAEROBIC Blood Culture adequate volume   Culture   Final    NO GROWTH 4 DAYS Performed at [facility], [address], [place], [place] [address]    Report Status PENDING  Incomplete  SARS Coronavirus 2 by RT PCR (hospital order, performed in [place] hospital lab) Nasopharyngeal Nasopharyngeal Swab     Status: None   Collection Time: 04/18/20  2:00 PM   Specimen: Nasopharyngeal Swab  Result Value Ref Range Status   SARS Coronavirus 2 NEGATIVE NEGATIVE Final    Comment: (NOTE) SARS-CoV-2 target nucleic acids are NOT DETECTED.  The SARS-CoV-2 RNA is generally detectable in upper and lower respiratory specimens during the acute phase of infection. The lowest concentration of SARS-CoV-2 viral copies this assay can detect is 250 copies / mL. A negative  result does not preclude SARS-CoV-2 infection and should not be used as the sole basis for treatment or other patient management decisions.  A negative result may occur with improper specimen collection / handling, submission of specimen other than nasopharyngeal swab, presence of viral mutation(s) within the areas targeted by this assay, and inadequate number of viral copies (<250 copies / mL). A negative result must be combined with clinical observations, patient history, and epidemiological information.  Fact Sheet for Patients:   StrictlyIdeas.no  Fact Sheet for Healthcare Providers: BankingDealers.co.za  This test is not yet approved or  cleared by the [place] FDA and has been authorized for detection and/or diagnosis of SARS-CoV-2 by FDA under an Emergency Use Authorization (EUA).  This EUA will remain in effect (meaning this test can be used) for the duration of the COVID-19 declaration under Section 564(b)(1) of the Act, 21 U.S.C. section 360bbb-3(b)(1), unless the authorization is terminated or revoked sooner.  Performed at [facility], [address] [place], [place] [address]     Today   Subjective    Lashya Passe today has no new complaints  No fever  Or chills   No Nausea, Vomiting or Diarrhea       Patient has been seen and examined prior to discharge   Objective   Blood pressure (!) 139/101, pulse 81, temperature 98.3 F (36.8 C), resp. rate 18, height 5\' 7"  (1.702 m), weight 99.8 kg, SpO2 96 %.   Intake/Output Summary (Last 24 hours) at 04/19/2020 1536 Last data filed at 04/19/2020 1300 Gross per 24 hour  Intake 560 ml  Output 1050 ml  Net -490 ml    Exam Gen:- Awake Alert,  in no apparent distress  HEENT:- [place].AT, No sclera icterus EYES====-- legally blind Nose- 2 L/min Neck-Supple Neck,No JVD,.  Lungs-improved air movement , no wheezing  CV- S1, S2 normal, regular  Abd-  +ve B.Sounds, Abd  Soft, No tenderness,    Extremity/Skin:- No  edema, pedal pulses present  Psych-affect is appropriate, oriented x3 Neuro-generalized, no new focal deficits, no tremors   Data Review   CBC w Diff:  Lab Results  Component Value Date   WBC 6.7 04/18/2020   HGB 10.9 (L) 04/18/2020   HCT 36.0  04/18/2020   PLT 167 04/18/2020   LYMPHOPCT 3 04/14/2020   MONOPCT 4 04/14/2020   EOSPCT 0 04/14/2020   BASOPCT 0 04/14/2020    CMP:  Lab Results  Component Value Date   NA 138 04/18/2020   K 3.1 (L) 04/18/2020   CL 103 04/18/2020   CO2 27 04/18/2020   BUN 13 04/18/2020   CREATININE 0.44 04/18/2020   PROT 6.8 01/30/2019   ALBUMIN 3.6 01/30/2019   BILITOT 0.8 01/30/2019   ALKPHOS 83 01/30/2019   AST 19 01/30/2019   ALT 15 01/30/2019  .   Total Discharge time is about 33 minutes  Roxan Hockey M.D on 04/19/2020 at 3:36 PM  Go to [email or web address] -  for contact info  [facility] - Office  [phone]

## 2020-04-19 NOTE — Progress Notes (Signed)
Nsg Discharge Note  Admit Date:  04/14/2020 Discharge date: 04/19/2020   Meagan Holt to be D/C'd Home per MD order.  AVS completed.  Patient able to verbalize understanding.  Discharge Medication: Allergies as of 04/19/2020      Reactions   Latex Itching, Swelling   Doxycycline Rash      Medication List    STOP taking these medications   ibuprofen 600 MG tablet Commonly known as: ADVIL     TAKE these medications   Advair Diskus 250-50 MCG/DOSE Aepb Generic drug: Fluticasone-Salmeterol Inhale 1 puff into the lungs 2 (two) times daily.   albuterol 108 (90 Base) MCG/ACT inhaler Commonly known as: VENTOLIN HFA Inhale 2 puffs into the lungs every 6 (six) hours as needed for wheezing or shortness of breath. What changed: how much to take   aspirin EC 325 MG tablet Take 1 tablet (325 mg total) by mouth daily with breakfast. What changed: when to take this   atorvastatin 20 MG tablet Commonly known as: LIPITOR Take 1 tablet (20 mg total) by mouth daily. Start taking on: April 20, 2020   azithromycin 500 MG tablet Commonly known as: ZITHROMAX Take 1 tablet (500 mg total) by mouth daily for 2 days.   buPROPion 300 MG 24 hr tablet Commonly known as: WELLBUTRIN XL Take 300 mg by mouth at bedtime.   cefdinir 300 MG capsule Commonly known as: OMNICEF Take 1 capsule (300 mg total) by mouth 2 (two) times daily for 3 days.   Fish Oil 1000 MG Caps Take 3 capsules by mouth daily.   FLUoxetine 10 MG capsule Commonly known as: PROZAC Take 1 capsule by mouth every morning.   furosemide 20 MG tablet Commonly known as: LASIX Take 20 mg by mouth daily as needed for fluid or edema.   gabapentin 600 MG tablet Commonly known as: NEURONTIN Take 600 mg by mouth 3 (three) times daily.   guaiFENesin 600 MG 12 hr tablet Commonly known as: MUCINEX Take 1 tablet (600 mg total) by mouth 2 (two) times daily.   HYDROcodone Bitartrate ER 30 MG T24a Take 1 tablet by mouth daily.    HYDROcodone-acetaminophen 10-325 MG tablet Commonly known as: NORCO Take 1 tablet by mouth 3 (three) times daily as needed for pain.   ICAPS PO Take 1 tablet by mouth daily.   multivitamin with minerals Tabs tablet Take 1 tablet by mouth daily.   Systane 0.4-0.3 % Soln Generic drug: Polyethyl Glycol-Propyl Glycol Apply 1-2 drops to eye daily as needed (for dry eye relief).   zolpidem 5 MG tablet Commonly known as: AMBIEN Take 5 mg by mouth at bedtime.       Discharge Assessment: Vitals:   04/19/20 0721 04/19/20 1355  BP:  (!) 139/101  Pulse:  81  Resp:  18  Temp:  98.3 F (36.8 C)  SpO2: 94% 96%   Skin clean, dry and intact without evidence of skin break down, no evidence of skin tears noted. IV catheter discontinued intact. Site without signs and symptoms of complications - no redness or edema noted at insertion site, patient denies c/o pain - only slight tenderness at site.  Dressing with slight pressure applied.  D/c Instructions-Education: Discharge instructions given to patient with verbalized understanding. D/c education completed with patient including follow up instructions, medication list, d/c activities limitations if indicated, with other d/c instructions as indicated by MD - patient able to verbalize understanding, all questions fully answered. Patient instructed to return to ED, call  911, or call MD for any changes in condition.  Patient escorted via [place], and D/C home via private auto.  Berton Bon, RN 04/19/2020 2:40 PM

## 2020-04-19 NOTE — Care Management Important Message (Signed)
Important Message  Patient Details  Name: Meagan Holt MRN: [id number] Date of Birth: 12-Oct-1940   Medicare Important Message Given:  Yes     Tommy Medal 04/19/2020, 3:05 PM

## 2020-04-21 LAB — CULTURE, BLOOD (ROUTINE X 2)
Culture: NO GROWTH
Culture: NO GROWTH
Special Requests: ADEQUATE
Special Requests: ADEQUATE

## 2020-04-25 ENCOUNTER — Other Ambulatory Visit: Payer: Self-pay

## 2020-04-25 ENCOUNTER — Emergency Department (HOSPITAL_COMMUNITY): Payer: Medicare Other

## 2020-04-25 ENCOUNTER — Encounter (HOSPITAL_COMMUNITY): Payer: Self-pay | Admitting: *Deleted

## 2020-04-25 ENCOUNTER — Inpatient Hospital Stay (HOSPITAL_COMMUNITY)
Admission: EM | Admit: 2020-04-25 | Discharge: 2020-04-27 | DRG: 070 | Disposition: A | Discharge status: Skilled Nursing Facility | Payer: Medicare Other | Attending: Internal Medicine | Admitting: Internal Medicine

## 2020-04-25 DIAGNOSIS — Z7951 Long term (current) use of inhaled steroids: Secondary | ICD-10-CM

## 2020-04-25 DIAGNOSIS — Z87442 Personal history of urinary calculi: Secondary | ICD-10-CM

## 2020-04-25 DIAGNOSIS — H548 Legal blindness, as defined in USA: Secondary | ICD-10-CM | POA: Diagnosis present

## 2020-04-25 DIAGNOSIS — G9341 Metabolic encephalopathy: Secondary | ICD-10-CM | POA: Diagnosis not present

## 2020-04-25 DIAGNOSIS — F329 Major depressive disorder, single episode, unspecified: Secondary | ICD-10-CM | POA: Diagnosis present

## 2020-04-25 DIAGNOSIS — Z881 Allergy status to other antibiotic agents status: Secondary | ICD-10-CM

## 2020-04-25 DIAGNOSIS — H8110 Benign paroxysmal vertigo, unspecified ear: Secondary | ICD-10-CM | POA: Diagnosis present

## 2020-04-25 DIAGNOSIS — J189 Pneumonia, unspecified organism: Secondary | ICD-10-CM | POA: Diagnosis present

## 2020-04-25 DIAGNOSIS — D72829 Elevated white blood cell count, unspecified: Secondary | ICD-10-CM | POA: Diagnosis present

## 2020-04-25 DIAGNOSIS — M25569 Pain in unspecified knee: Secondary | ICD-10-CM

## 2020-04-25 DIAGNOSIS — Z20822 Contact with and (suspected) exposure to covid-19: Secondary | ICD-10-CM | POA: Diagnosis present

## 2020-04-25 DIAGNOSIS — J449 Chronic obstructive pulmonary disease, unspecified: Secondary | ICD-10-CM | POA: Diagnosis present

## 2020-04-25 DIAGNOSIS — F17210 Nicotine dependence, cigarettes, uncomplicated: Secondary | ICD-10-CM | POA: Diagnosis present

## 2020-04-25 DIAGNOSIS — G92 Toxic encephalopathy: Secondary | ICD-10-CM | POA: Diagnosis present

## 2020-04-25 DIAGNOSIS — F32A Depression, unspecified: Secondary | ICD-10-CM | POA: Diagnosis present

## 2020-04-25 DIAGNOSIS — M549 Dorsalgia, unspecified: Secondary | ICD-10-CM | POA: Diagnosis present

## 2020-04-25 DIAGNOSIS — I959 Hypotension, unspecified: Secondary | ICD-10-CM | POA: Diagnosis present

## 2020-04-25 DIAGNOSIS — Z79891 Long term (current) use of opiate analgesic: Secondary | ICD-10-CM

## 2020-04-25 DIAGNOSIS — Z79899 Other long term (current) drug therapy: Secondary | ICD-10-CM

## 2020-04-25 DIAGNOSIS — S0083XA Contusion of other part of head, initial encounter: Secondary | ICD-10-CM | POA: Diagnosis present

## 2020-04-25 DIAGNOSIS — Z7982 Long term (current) use of aspirin: Secondary | ICD-10-CM

## 2020-04-25 DIAGNOSIS — M25561 Pain in right knee: Secondary | ICD-10-CM | POA: Diagnosis present

## 2020-04-25 DIAGNOSIS — Z9884 Bariatric surgery status: Secondary | ICD-10-CM

## 2020-04-25 DIAGNOSIS — J811 Chronic pulmonary edema: Secondary | ICD-10-CM | POA: Diagnosis present

## 2020-04-25 DIAGNOSIS — Z8673 Personal history of transient ischemic attack (TIA), and cerebral infarction without residual deficits: Secondary | ICD-10-CM

## 2020-04-25 DIAGNOSIS — Y92009 Unspecified place in unspecified non-institutional (private) residence as the place of occurrence of the external cause: Secondary | ICD-10-CM

## 2020-04-25 DIAGNOSIS — T40605A Adverse effect of unspecified narcotics, initial encounter: Secondary | ICD-10-CM | POA: Diagnosis present

## 2020-04-25 DIAGNOSIS — R4182 Altered mental status, unspecified: Secondary | ICD-10-CM | POA: Diagnosis not present

## 2020-04-25 DIAGNOSIS — Z9181 History of falling: Secondary | ICD-10-CM

## 2020-04-25 DIAGNOSIS — J9612 Chronic respiratory failure with hypercapnia: Secondary | ICD-10-CM | POA: Diagnosis present

## 2020-04-25 DIAGNOSIS — Z8619 Personal history of other infectious and parasitic diseases: Secondary | ICD-10-CM

## 2020-04-25 DIAGNOSIS — G8929 Other chronic pain: Secondary | ICD-10-CM | POA: Diagnosis present

## 2020-04-25 DIAGNOSIS — Z91040 Latex allergy status: Secondary | ICD-10-CM

## 2020-04-25 DIAGNOSIS — Z9841 Cataract extraction status, right eye: Secondary | ICD-10-CM

## 2020-04-25 DIAGNOSIS — K219 Gastro-esophageal reflux disease without esophagitis: Secondary | ICD-10-CM | POA: Diagnosis present

## 2020-04-25 DIAGNOSIS — Z9842 Cataract extraction status, left eye: Secondary | ICD-10-CM

## 2020-04-25 DIAGNOSIS — Z961 Presence of intraocular lens: Secondary | ICD-10-CM | POA: Diagnosis present

## 2020-04-25 DIAGNOSIS — I272 Pulmonary hypertension, unspecified: Secondary | ICD-10-CM | POA: Diagnosis present

## 2020-04-25 DIAGNOSIS — I4891 Unspecified atrial fibrillation: Secondary | ICD-10-CM | POA: Diagnosis present

## 2020-04-25 DIAGNOSIS — G9340 Encephalopathy, unspecified: Secondary | ICD-10-CM | POA: Diagnosis present

## 2020-04-25 DIAGNOSIS — W19XXXA Unspecified fall, initial encounter: Secondary | ICD-10-CM | POA: Diagnosis present

## 2020-04-25 LAB — BLOOD GAS, ARTERIAL
Acid-Base Excess: 2.1 mmol/L — ABNORMAL HIGH (ref 0.0–2.0)
Acid-Base Excess: 2.2 mmol/L — ABNORMAL HIGH (ref 0.0–2.0)
Bicarbonate: 25.1 mmol/L (ref 20.0–28.0)
Bicarbonate: 25.2 mmol/L (ref 20.0–28.0)
FIO2: 32
FIO2: 28
O2 Saturation: 91.6 %
O2 Saturation: 87.4 %
Patient temperature: 37.7
Patient temperature: 37
pCO2 arterial: 62.5 mmHg — ABNORMAL HIGH (ref 32.0–48.0)
pCO2 arterial: 57.8 mmHg — ABNORMAL HIGH (ref 32.0–48.0)
pH, Arterial: 7.279 — ABNORMAL LOW (ref 7.350–7.450)
pH, Arterial: 7.305 — ABNORMAL LOW (ref 7.350–7.450)
pO2, Arterial: 74.5 mmHg — ABNORMAL LOW (ref 83.0–108.0)
pO2, Arterial: 60.5 mmHg — ABNORMAL LOW (ref 83.0–108.0)

## 2020-04-25 LAB — CBC WITH DIFFERENTIAL/PLATELET
Abs Immature Granulocytes: 0.05 10*3/uL (ref 0.00–0.07)
Basophils Absolute: 0.1 10*3/uL (ref 0.0–0.1)
Basophils Relative: 1 %
Eosinophils Absolute: 0 10*3/uL (ref 0.0–0.5)
Eosinophils Relative: 0 %
HCT: 41.8 % (ref 36.0–46.0)
Hemoglobin: 12.2 g/dL (ref 12.0–15.0)
Immature Granulocytes: 0 %
Lymphocytes Relative: 8 %
Lymphs Abs: 1 10*3/uL (ref 0.7–4.0)
MCH: 29.6 pg (ref 26.0–34.0)
MCHC: 29.2 g/dL — ABNORMAL LOW (ref 30.0–36.0)
MCV: 101.5 fL — ABNORMAL HIGH (ref 80.0–100.0)
Monocytes Absolute: 0.7 10*3/uL (ref 0.1–1.0)
Monocytes Relative: 6 %
Neutro Abs: 10.1 10*3/uL — ABNORMAL HIGH (ref 1.7–7.7)
Neutrophils Relative %: 85 %
Platelets: 236 10*3/uL (ref 150–400)
RBC: 4.12 MIL/uL (ref 3.87–5.11)
RDW: 14.6 % (ref 11.5–15.5)
WBC: 12 10*3/uL — ABNORMAL HIGH (ref 4.0–10.5)
nRBC: 0 % (ref 0.0–0.2)

## 2020-04-25 LAB — URINALYSIS, ROUTINE W REFLEX MICROSCOPIC
Bacteria, UA: NONE SEEN
Bilirubin Urine: NEGATIVE
Glucose, UA: NEGATIVE mg/dL
Ketones, ur: 5 mg/dL — AB
Leukocytes,Ua: NEGATIVE
Nitrite: NEGATIVE
Protein, ur: 100 mg/dL — AB
Specific Gravity, Urine: 1.028 (ref 1.005–1.030)
pH: 5 (ref 5.0–8.0)

## 2020-04-25 LAB — BRAIN NATRIURETIC PEPTIDE: B Natriuretic Peptide: 489 pg/mL — ABNORMAL HIGH (ref 0.0–100.0)

## 2020-04-25 LAB — COMPREHENSIVE METABOLIC PANEL
ALT: 37 U/L (ref 0–44)
AST: 40 U/L (ref 15–41)
Albumin: 3.4 g/dL — ABNORMAL LOW (ref 3.5–5.0)
Alkaline Phosphatase: 92 U/L (ref 38–126)
Anion gap: 8 (ref 5–15)
BUN: 22 mg/dL (ref 8–23)
CO2: 28 mmol/L (ref 22–32)
Calcium: 8.8 mg/dL — ABNORMAL LOW (ref 8.9–10.3)
Chloride: 103 mmol/L (ref 98–111)
Creatinine, Ser: 0.72 mg/dL (ref 0.44–1.00)
GFR calc Af Amer: >60 mL/min (ref >60)
GFR calc non Af Amer: >60 mL/min (ref >60)
Glucose, Bld: 100 mg/dL — ABNORMAL HIGH (ref 70–99)
Potassium: 4 mmol/L (ref 3.5–5.1)
Sodium: 139 mmol/L (ref 135–145)
Total Bilirubin: 0.6 mg/dL (ref 0.3–1.2)
Total Protein: 7 g/dL (ref 6.5–8.1)

## 2020-04-25 LAB — PROTIME-INR
INR: 1.1 (ref 0.8–1.2)
Prothrombin Time: 14 seconds (ref 11.4–15.2)

## 2020-04-25 LAB — APTT: aPTT: 25 seconds (ref 24–36)

## 2020-04-25 LAB — RAPID URINE DRUG SCREEN, HOSP PERFORMED
Amphetamines: NOT DETECTED
Barbiturates: NOT DETECTED
Benzodiazepines: NOT DETECTED
Cocaine: NOT DETECTED
Opiates: POSITIVE — AB
Tetrahydrocannabinol: NOT DETECTED

## 2020-04-25 LAB — TROPONIN I (HIGH SENSITIVITY)
Troponin I (High Sensitivity): 17 ng/L (ref <18)
Troponin I (High Sensitivity): 17 ng/L (ref <18)

## 2020-04-25 LAB — SARS CORONAVIRUS 2 BY RT PCR (HOSPITAL ORDER, PERFORMED IN ~~LOC~~ HOSPITAL LAB): SARS Coronavirus 2: NEGATIVE

## 2020-04-25 LAB — CK: Total CK: 122 U/L (ref 38–234)

## 2020-04-25 LAB — LACTIC ACID, PLASMA: Lactic Acid, Venous: 1.3 mmol/L (ref 0.5–1.9)

## 2020-04-25 MED ORDER — VANCOMYCIN HCL IN DEXTROSE 1-5 GM/200ML-% IV SOLN: 1000.0000 mg | Freq: Once | INTRAVENOUS | Status: DC

## 2020-04-25 MED ORDER — SODIUM CHLORIDE 0.9 % IV BOLUS
1000.0000 mL | Freq: Once | INTRAVENOUS | Status: AC
  Administered 2020-04-25: 1000 mL via INTRAVENOUS

## 2020-04-25 MED ORDER — IPRATROPIUM-ALBUTEROL 0.5-2.5 (3) MG/3ML IN SOLN
3.0000 mL | RESPIRATORY_TRACT | Status: DC | PRN
  Administered 2020-04-25: 3 mL via RESPIRATORY_TRACT
  Filled 2020-04-25: qty 3

## 2020-04-25 MED ORDER — LACTATED RINGERS IV SOLN: INTRAVENOUS | Status: DC

## 2020-04-25 MED ORDER — ONDANSETRON HCL 4 MG PO TABS: 4.0000 mg | ORAL_TABLET | Freq: Four times a day (QID) | ORAL | Status: DC | PRN

## 2020-04-25 MED ORDER — ACETAMINOPHEN 325 MG PO TABS
650.0000 mg | ORAL_TABLET | Freq: Four times a day (QID) | ORAL | Status: DC | PRN
  Administered 2020-04-27: 650 mg via ORAL
  Filled 2020-04-25: qty 2

## 2020-04-25 MED ORDER — SODIUM CHLORIDE 0.9 % IV SOLN
2.0000 g | Freq: Three times a day (TID) | INTRAVENOUS | Status: DC
  Administered 2020-04-25 – 2020-04-27 (×5): 2 g via INTRAVENOUS
  Filled 2020-04-25 (×5): qty 2

## 2020-04-25 MED ORDER — ACETAMINOPHEN 650 MG RE SUPP: 650.0000 mg | Freq: Four times a day (QID) | RECTAL | Status: DC | PRN

## 2020-04-25 MED ORDER — DEXTROSE-NACL 5-0.9 % IV SOLN: INTRAVENOUS | Status: AC

## 2020-04-25 MED ORDER — ASPIRIN EC 325 MG PO TBEC
325.0000 mg | DELAYED_RELEASE_TABLET | Freq: Every day | ORAL | Status: DC
  Administered 2020-04-26 – 2020-04-27 (×2): 325 mg via ORAL
  Filled 2020-04-25 (×2): qty 1

## 2020-04-25 MED ORDER — VANCOMYCIN HCL 2000 MG/400ML IV SOLN
2000.0000 mg | Freq: Once | INTRAVENOUS | Status: AC
  Administered 2020-04-25: 2000 mg via INTRAVENOUS
  Filled 2020-04-25: qty 400

## 2020-04-25 MED ORDER — ATORVASTATIN CALCIUM 20 MG PO TABS
20.0000 mg | ORAL_TABLET | Freq: Every day | ORAL | Status: DC
  Administered 2020-04-26 – 2020-04-27 (×2): 20 mg via ORAL
  Filled 2020-04-25 (×2): qty 1

## 2020-04-25 MED ORDER — SODIUM CHLORIDE 0.9 % IV SOLN
2.0000 g | Freq: Once | INTRAVENOUS | Status: AC
  Administered 2020-04-25: 2 g via INTRAVENOUS
  Filled 2020-04-25: qty 2

## 2020-04-25 MED ORDER — ONDANSETRON HCL 4 MG/2ML IJ SOLN: 4.0000 mg | Freq: Four times a day (QID) | INTRAMUSCULAR | Status: DC | PRN

## 2020-04-25 MED ORDER — VANCOMYCIN HCL 1250 MG/250ML IV SOLN
1250.0000 mg | Freq: Two times a day (BID) | INTRAVENOUS | Status: DC
  Administered 2020-04-26 – 2020-04-27 (×3): 1250 mg via INTRAVENOUS
  Filled 2020-04-25 (×3): qty 250

## 2020-04-25 NOTE — H&P (Signed)
History and Physical    Meagan Holt SNK:[id number] DOB: 1940/11/15 DOA: 04/25/2020  PCP: [name], MD   Patient coming from: Home  I have personally briefly reviewed patient's old medical records in [place]  Chief Complaint: Fall, Altered mental status  HPI: Meagan Holt is a 79 y.o. 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  Patient was found on the floor earlier in the day, by neighbor, EMS was called then, patient refused to come to the ED.  Later in the afternoon, neighbor went to check on patient again, found patient unresponsive. At the time of my evaluation, patient is sleepy, but arouses to voice, follows directions, answers questions appropriately.  Mental status appears to have improved since being in the ED.  Patient tells me she was brought to the hospital because she fell, was not able to get up.  She denies difficulty breathing.  Denies pain with urination.  She reports lower extremity swelling without pain, but is unable to tell me how long this has been going on.  Recent hospitalization 8/11-8/15 for severe sepsis secondary to bilateral pneumonia with acute on chronic hypoxic respiratory failure.  Evaluated by physical therapy, SNF recommended, but patient declined, despite increasing risk for falling at home, due to her visual impairment and use of narcotic medications and the need for home oxygen with a long cord.  ED Course: Temperature 99.9, O2 sats 93 to 94% on 3 L O2, WBC 12.  Troponin 17, UDS positive for opiates.  Lactic acid 1.3.  BNP 489 no prior to compare.  EKG shows atrial fibrillation rate 96.  Portable chest x-ray shows mild pulmonary edema, stable patchy reticular opacities at the lung bases cannot exclude interstitial lung disease.  Code sepsis  was called, 1 L bolus normal saline was given, IV Vanco and cefepime was started for possible pneumonia.  Review of Systems: As per HPI all other systems reviewed and negative.  Past Medical History:  Diagnosis Date  . Arthritis    "all my joints" (07/30/2013)  . Chronic back pain   . Chronic bronchitis ([place])    "get it q year" (07/30/2013)  . COPD (chronic obstructive pulmonary disease) ([place])   . Depression   . Falls frequently    "fell twice in the last 2 days" (07/30/2013)  . GERD (gastroesophageal reflux disease)    "rarely" (07/30/2013)  . H/O hiatal hernia   . History of blood transfusion   . Kidney stones   . Stroke [facility])     Past Surgical History:  Procedure Laterality Date  . APPENDECTOMY    . BACK SURGERY     "put cages in mid back; did arthroscopic OR on lower back" (11/326/2014)  . BILATERAL OOPHORECTOMY Bilateral   . CATARACT EXTRACTION W/ INTRAOCULAR LENS  IMPLANT, BILATERAL Bilateral   . CHOLECYSTECTOMY    . EXCISIONAL HEMORRHOIDECTOMY    . GASTRIC BYPASS    . HERNIA REPAIR     "removed my belly button too" (07/30/2013)  . KIDNEY STONE SURGERY     "twice" (07/30/2013)  . KNEE ARTHROSCOPY Left   . TONSILLECTOMY       reports that she has been smoking cigarettes. She has a 42.00 pack-year smoking history. She has never used smokeless tobacco. She reports current alcohol use. She reports that  she does not use drugs.  Allergies  Allergen Reactions  . Latex Itching and Swelling  . Doxycycline Rash    Family History  Problem Relation Age of Onset  . Stomach cancer Father     Prior to Admission medications   Medication Sig Start Date End Date Taking? Authorizing Provider  ADVAIR DISKUS 250-50 MCG/DOSE AEPB Inhale 1 puff into the lungs 2 (two) times daily. 04/19/20   Roxan Hockey, MD  albuterol (VENTOLIN HFA) 108 (90 Base) MCG/ACT inhaler Inhale 2 puffs into the lungs every 6 (six) hours as needed for wheezing or shortness of breath. 04/19/20    Roxan Hockey, MD  aspirin EC 325 MG tablet Take 1 tablet (325 mg total) by mouth daily with breakfast. 04/19/20   Roxan Hockey, MD  atorvastatin (LIPITOR) 20 MG tablet Take 1 tablet (20 mg total) by mouth daily. 04/20/20   Roxan Hockey, MD  buPROPion (WELLBUTRIN XL) 300 MG 24 hr tablet Take 300 mg by mouth at bedtime.     [provider]  FLUoxetine (PROZAC) 10 MG capsule Take 1 capsule by mouth every morning.  05/04/17   [provider]  furosemide (LASIX) 20 MG tablet Take 20 mg by mouth daily as needed for fluid or edema.    [provider]  gabapentin (NEURONTIN) 600 MG tablet Take 600 mg by mouth 3 (three) times daily. 03/29/20   [provider]  guaiFENesin (MUCINEX) 600 MG 12 hr tablet Take 1 tablet (600 mg total) by mouth 2 (two) times daily. 04/19/20   Roxan Hockey, MD  HYDROcodone Bitartrate ER 30 MG T24A Take 1 tablet by mouth daily. 03/29/20   [provider]  HYDROcodone-acetaminophen (NORCO) 10-325 MG tablet Take 1 tablet by mouth 3 (three) times daily as needed for pain. 03/29/20   [provider]  Multiple Vitamin (MULTIVITAMIN WITH MINERALS) TABS tablet Take 1 tablet by mouth daily.    [provider]  Multiple Vitamins-Minerals (ICAPS PO) Take 1 tablet by mouth daily.    [provider]  Omega-3 Fatty Acids (FISH OIL) 1000 MG CAPS Take 3 capsules by mouth daily.    [provider]  Polyethyl Glycol-Propyl Glycol (SYSTANE) 0.4-0.3 % SOLN Apply 1-2 drops to eye daily as needed (for dry eye relief).    [provider]  zolpidem (AMBIEN) 5 MG tablet Take 5 mg by mouth at bedtime.  02/11/19   [provider]    Physical Exam: Vitals:   04/25/20 1653 04/25/20 1655 04/25/20 1800  BP: 122/62  106/73  Pulse: 100  (!) 43  Resp: 20  15  Temp: 99.9 F (37.7 C)    TempSrc: Rectal    SpO2: 94%  94%  Weight:  99.8 kg   Height:  5\' 7"  (1.702 m)     Constitutional: Lethargic, but  answering questions appropriately. Vitals:   04/25/20 1653 04/25/20 1655 04/25/20 1800  BP: 122/62  106/73  Pulse: 100  (!) 43  Resp: 20  15  Temp: 99.9 F (37.7 C)    TempSrc: Rectal    SpO2: 94%  94%  Weight:  99.8 kg   Height:  5\' 7"  (1.702 m)    Eyes: Large purplish bruising/ecchymotes to right eye, from multiple falls, raccoons eye appearance, to PERRL L > R, lids and conjunctivae normal ENMT: Mucous membranes are dry.  Neck: normal, supple, no masses, no thyromegaly Respiratory: Anterior auscultation, clear to auscultation bilaterally, no wheezing, no crackles. Normal respiratory effort. No accessory muscle  use.  Cardiovascular: Irregular rate and rhythm, no murmurs / rubs / gallops.  1+ pitting extremity edema to mid legs-patient reports this is chronic. 2+ pedal pulses.   Abdomen: no tenderness, no masses palpated. No hepatosplenomegaly. Bowel sounds positive.  Musculoskeletal: no clubbing / cyanosis. No joint deformity upper and lower extremities. Good ROM, no contractures. Normal muscle tone.  Skin: Bruising to bilateral upper extremities, right side of face, no rashes, lesions, ulcers. No induration Neurologic: 5/5 strength bilateral upper extremity, 5/5 strength left lower extremity, right lower extremity strength 4/5-  Psychiatric: Normal judgment and insight. Alert and oriented x 3. Normal mood.   Labs on Admission: I have personally reviewed following labs and imaging studies  CBC: Recent Labs  Lab 04/25/20 1746  WBC 12.0*  NEUTROABS 10.1*  HGB 12.2  HCT 41.8  MCV 101.5*  PLT 841   Basic Metabolic Panel: Recent Labs  Lab 04/25/20 1746  NA 139  K 4.0  CL 103  CO2 28  GLUCOSE 100*  BUN 22  CREATININE 0.72  CALCIUM 8.8*    Liver Function Tests: Recent Labs  Lab 04/25/20 1746  AST 40  ALT 37  ALKPHOS 92  BILITOT 0.6  PROT 7.0  ALBUMIN 3.4*   Coagulation Profile: Recent Labs  Lab 04/25/20 1749  INR 1.1   Urine analysis:    Component  Value Date/Time   COLORURINE AMBER (A) 04/25/2020 1703   APPEARANCEUR HAZY (A) 04/25/2020 1703   LABSPEC 1.028 04/25/2020 1703   PHURINE 5.0 04/25/2020 1703   GLUCOSEU NEGATIVE 04/25/2020 1703   HGBUR SMALL (A) 04/25/2020 1703   BILIRUBINUR NEGATIVE 04/25/2020 1703   KETONESUR 5 (A) 04/25/2020 1703   PROTEINUR 100 (A) 04/25/2020 1703   UROBILINOGEN 0.2 07/30/2013 1831   NITRITE NEGATIVE 04/25/2020 1703   LEUKOCYTESUR NEGATIVE 04/25/2020 1703    Radiological Exams on Admission: CT Head Wo Contrast  Result Date: 04/25/2020 CLINICAL DATA:  Status post fall. EXAM: CT HEAD WITHOUT CONTRAST TECHNIQUE: Contiguous axial images were obtained from the base of the skull through the vertex without intravenous contrast. COMPARISON:  April 14, 2020 FINDINGS: Brain: There is mild cerebral atrophy with widening of the extra-axial spaces and ventricular dilatation. There are areas of decreased attenuation within the white matter tracts of the supratentorial brain, consistent with microvascular disease changes. Vascular: No hyperdense vessel or unexpected calcification. Skull: Normal. Negative for fracture or focal lesion. Sinuses/Orbits: No acute finding. Other: There is mild right frontal scalp soft tissue swelling. IMPRESSION: Mild right frontal scalp soft tissue swelling without evidence of an acute fracture or acute intracranial abnormality. Electronically Signed   By: Virgina Norfolk M.D.   On: 04/25/2020 17:25   CT Cervical Spine Wo Contrast  Result Date: 04/25/2020 CLINICAL DATA:  Status post trauma. EXAM: CT CERVICAL SPINE WITHOUT CONTRAST TECHNIQUE: Multidetector CT imaging of the cervical spine was performed without intravenous contrast. Multiplanar CT image reconstructions were also generated. COMPARISON:  None. FINDINGS: Alignment: There is approximately 2 mm anterolisthesis of the C2 vertebral body on C3. Skull base and vertebrae: No acute fracture. No primary bone lesion or focal pathologic  process. Soft tissues and spinal canal: No prevertebral fluid or swelling. No visible canal hematoma. Disc levels: Moderate to marked severity endplate sclerosis is seen at the levels of C3-C4, C4-C5, C5-C6, C6-C7 and C7-T1. Marked severity intervertebral disc space narrowing is seen from the level of C3 through T1. Marked severity bilateral multilevel facet joint hypertrophy is noted. Upper chest: Negative. Other: None. IMPRESSION:  1. No acute fracture within the cervical spine. 2. Marked severity multilevel degenerative disc disease and facet joint hypertrophy. Electronically Signed   By: Virgina Norfolk M.D.   On: 04/25/2020 17:27   DG Chest Port 1 View  Result Date: 04/25/2020 CLINICAL DATA:  Dyspnea, current smoker, COPD, frequent falls, altered mental status EXAM: PORTABLE CHEST 1 VIEW COMPARISON:  04/15/2019 chest radiograph. FINDINGS: Stable cardiomediastinal silhouette with mild cardiomegaly. No pneumothorax. No pleural effusion. Patchy reticular opacities at the lung bases are similar. Borderline mild pulmonary edema. IMPRESSION: 1. Mild cardiomegaly and borderline mild pulmonary edema. 2. Stable patchy reticular opacities at the lung bases, cannot exclude interstitial lung disease. Electronically Signed   By: Ilona Sorrel M.D.   On: 04/25/2020 17:35    EKG: Independently reviewed.  Atrial fibrillation rate 96, QTc 420.  PVCs present.  T wave abnormalities which are consistent with prior EKG.  Assessment/Plan Principal Problem:   AMS (altered mental status) Active Problems:   COPD (chronic obstructive pulmonary disease) (HCC)   Depression   Benign paroxysmal positional vertigo  Metabolic encephalopathy-temperature of 99.9, mild leukocytosis of 12, soft blood pressure systolic 38-756.  Normal lactic acid of 1.3.  UA not suggestive of infection.  Portable chest x-ray stable reticular opacities.  ABG shows pH of 7.2, PCO2 of 62, for ABG from 6 years ago PCO2 was elevated at 51.  But serum  bicarb is 28.  Head and cervical CT without acute fracture or intracranial abnormality.  UDS positive for opiates, patient is on chronic narcotics.  Mental status improved in the ED. Etiology likely from opioids, possible infectious etiology considering low-grade fever and soft blood pressure.  But at this time no focus of infection identified. -Continue antibiotics with Vanco and cefepime -BiPAP not started as patient does not appear dyspneic, no wheezing, O2 sats greater than 93 - 96% on 3L, and with improving mental status without intervention -Repeat ABG -BMP, CBC in the morning -Hold home narcotics, and other psychoactive agents fluoxetine, gabapentin, bupropion, zolpidem. -1 L bolus given in ED, cont D5-n/s 100cc/hr x 12hrs -Follow-up blood and urine cultures ordered in the ED -Remain n.p.o.  COPD-stable. -DuoNebs as needed  Atrial fibrillation-rate controlled, not on rate limiting medications.  Not on anticoagulation.  On recent hospitalization, anticoagulation was discussed, patient decided not to start anticoagulation at this time.  Considering frequent falls, patient would not be a candidate for anticoagulation. -Continue aspirin  Multiple falls-large bruising to right side of face, bilateral upper extremities.  Head and cervical CT shows mild right frontal scalp soft tissue swelling, without acute fracture or intracranial abnormality.  Patient high risk for falls-legally blind, on chronic opiates, history of BPPV, and on home O2 requiring long cord. -Recent hospitalization patient declined SNF, will need to discuss this again with patient -Check CK  History of stroke-mild right lower extremity weakness. -Resume aspirin, statins  Chronic pain -Hold home narcotics for now    DVT prophylaxis: SCDs for now with large ecchymosis to face, Code Status: Full code Family Communication: None at bedside Disposition Plan: ~ 2 days, pending improvement in mental status. Consults called:  None. Admission status: Observation, telemetry I certify that at the point of admission it is my clinical judgment that the patient will require inpatient hospital care spanning beyond 2 midnights from the point of admission due to high intensity of service, high risk for further deterioration and high frequency of surveillance required.    Bethena Roys MD [facility]  04/25/2020, 10:17  PM    

## 2020-04-25 NOTE — ED Provider Notes (Signed)
[facility] EMERGENCY DEPARTMENT Provider Note   CSN: [id number] Arrival date & time: 04/25/20  1623     History Chief Complaint  Patient presents with  . Altered Mental Status    Meagan Holt is a 79 y.o. female.  Patient had a fall this week.  And then this morning she was found on the floor.  She refused transport at that time.  She was checked on by her neighbor today and she was unresponsive  The history is provided by the patient. No language interpreter was used.  Altered Mental Status Presenting symptoms: behavior changes   Severity:  Severe Most recent episode:  Today Episode history:  Multiple Timing:  Constant Progression:  Worsening Chronicity:  Recurrent Context: not alcohol use        Past Medical History:  Diagnosis Date  . Arthritis    "all my joints" (07/30/2013)  . Chronic back pain   . Chronic bronchitis ([place])    "get it q year" (07/30/2013)  . COPD (chronic obstructive pulmonary disease) ([place])   . Depression   . Falls frequently    "fell twice in the last 2 days" (07/30/2013)  . GERD (gastroesophageal reflux disease)    "rarely" (07/30/2013)  . H/O hiatal hernia   . History of blood transfusion   . Kidney stones   . Stroke [facility])     Patient Active Problem List   Diagnosis Date Noted  . AMS (altered mental status) 04/25/2020  . 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 Severe sepsis due to bilateral pneumonia with acute on chronic hypoxic respiratory failure 04/18/2020  . CAP (community acquired pneumonia) 04/14/2020  . Acute on chronic respiratory failure with hypoxia ([place]) 04/14/2020  . Recurrent falls 04/14/2020  . Atrial fibrillation with RVR ([place]) 04/14/2020  . Leukocytosis 04/14/2020  . Obesity (BMI 30.0-34.9) 04/14/2020  . Benign paroxysmal positional vertigo 09/08/2018  . Cellulitis of right lower leg 09/08/2018  . Orthostatic hypotension 05/22/2017  . Syncope and collapse 05/21/2017  . Chronic pain syndrome 05/21/2017  . Altered  mental status 05/20/2017  . GERD (gastroesophageal reflux disease) 05/20/2017  . Depression 05/20/2017  . History of stroke 05/20/2017  . Premature beats 05/20/2017  . Cervical spondylosis without myelopathy 05/15/2017  . Lumbar spondylosis 05/15/2017  . 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 Decreased range of motion of wrist 11/28/2016  . Anxiety 05/10/2016  . Head injury 07/30/2013  . Difficulty walking 07/30/2013  . COPD (chronic obstructive pulmonary disease) ([place]) 07/30/2013    Past Surgical History:  Procedure Laterality Date  . APPENDECTOMY    . BACK SURGERY     "put cages in mid back; did arthroscopic OR on lower back" (11/326/2014)  . BILATERAL OOPHORECTOMY Bilateral   . CATARACT EXTRACTION W/ INTRAOCULAR LENS  IMPLANT, BILATERAL Bilateral   . CHOLECYSTECTOMY    . EXCISIONAL HEMORRHOIDECTOMY    . GASTRIC BYPASS    . HERNIA REPAIR     "removed my belly button too" (07/30/2013)  . KIDNEY STONE SURGERY     "twice" (07/30/2013)  . KNEE ARTHROSCOPY Left   . TONSILLECTOMY       OB History   No obstetric history on file.     Family History  Problem Relation Age of Onset  . Stomach cancer Father     Social History   Tobacco Use  . Smoking status: Current Every Day Smoker    Packs/day: 1.00    Years: 42.00  Pack years: 42.00    Types: Cigarettes  . Smokeless tobacco: Never Used  Substance Use Topics  . Alcohol use: Yes    Comment: rarely   . Drug use: No    Home Medications Prior to Admission medications   Medication Sig Start Date End Date Taking? Authorizing Provider  ADVAIR DISKUS 250-50 MCG/DOSE AEPB Inhale 1 puff into the lungs 2 (two) times daily. 04/19/20   Roxan Hockey, MD  albuterol (VENTOLIN HFA) 108 (90 Base) MCG/ACT inhaler Inhale 2 puffs into the lungs every 6 (six) hours as needed for wheezing or shortness of breath. 04/19/20   Roxan Hockey, MD  aspirin EC 325 MG tablet Take 1 tablet (325 mg total)  by mouth daily with breakfast. 04/19/20   Roxan Hockey, MD  atorvastatin (LIPITOR) 20 MG tablet Take 1 tablet (20 mg total) by mouth daily. 04/20/20   Roxan Hockey, MD  buPROPion (WELLBUTRIN XL) 300 MG 24 hr tablet Take 300 mg by mouth at bedtime.     [provider]  FLUoxetine (PROZAC) 10 MG capsule Take 1 capsule by mouth every morning.  05/04/17   [provider]  furosemide (LASIX) 20 MG tablet Take 20 mg by mouth daily as needed for fluid or edema.    [provider]  gabapentin (NEURONTIN) 600 MG tablet Take 600 mg by mouth 3 (three) times daily. 03/29/20   [provider]  guaiFENesin (MUCINEX) 600 MG 12 hr tablet Take 1 tablet (600 mg total) by mouth 2 (two) times daily. 04/19/20   Roxan Hockey, MD  HYDROcodone Bitartrate ER 30 MG T24A Take 1 tablet by mouth daily. 03/29/20   [provider]  HYDROcodone-acetaminophen (NORCO) 10-325 MG tablet Take 1 tablet by mouth 3 (three) times daily as needed for pain. 03/29/20   [provider]  Multiple Vitamin (MULTIVITAMIN WITH MINERALS) TABS tablet Take 1 tablet by mouth daily.    [provider]  Multiple Vitamins-Minerals (ICAPS PO) Take 1 tablet by mouth daily.    [provider]  Omega-3 Fatty Acids (FISH OIL) 1000 MG CAPS Take 3 capsules by mouth daily.    [provider]  Polyethyl Glycol-Propyl Glycol (SYSTANE) 0.4-0.3 % SOLN Apply 1-2 drops to eye daily as needed (for dry eye relief).    [provider]  zolpidem (AMBIEN) 5 MG tablet Take 5 mg by mouth at bedtime.  02/11/19   [provider]    Allergies    Latex and Doxycycline  Review of Systems   Review of Systems  Unable to perform ROS: Mental status change    Physical Exam Updated Vital Signs BP 106/73   Pulse (!) 43   Temp 99.9 F (37.7 C) (Rectal)   Resp 15   Ht 5\' 7"  (1.702 m)   Wt 99.8 kg   SpO2 94%   BMI 34.46 kg/m   Physical Exam Vitals and nursing note  reviewed.  Constitutional:      Appearance: She is well-developed.  HENT:     Head: Normocephalic.     Nose: Nose normal.  Eyes:     General: No scleral icterus.    Conjunctiva/sclera: Conjunctivae normal.  Neck:     Thyroid: No thyromegaly.  Cardiovascular:     Rate and Rhythm: Normal rate and regular rhythm.     Heart sounds: No murmur heard.  No friction rub. No gallop.   Pulmonary:     Breath sounds: No stridor. No wheezing or rales.  Chest:  Chest wall: No tenderness.  Abdominal:     General: There is no distension.     Tenderness: There is no abdominal tenderness. There is no rebound.  Musculoskeletal:        General: Normal range of motion.     Cervical back: Neck supple.  Lymphadenopathy:     Cervical: No cervical adenopathy.  Skin:    Findings: No erythema or rash.  Neurological:     Motor: No abnormal muscle tone.     Coordination: Coordination normal.     Comments: Initially only responding to painful stimuli     ED Results / Procedures / Treatments   Labs (all labs ordered are listed, but only abnormal results are displayed) Labs Reviewed  CBC WITH DIFFERENTIAL/PLATELET - Abnormal; Notable for the following components:      Result Value   WBC 12.0 (*)    MCV 101.5 (*)    MCHC 29.2 (*)    Neutro Abs 10.1 (*)    All other components within normal limits  COMPREHENSIVE METABOLIC PANEL - Abnormal; Notable for the following components:   Glucose, Bld 100 (*)    Calcium 8.8 (*)    Albumin 3.4 (*)    All other components within normal limits  BLOOD GAS, ARTERIAL - Abnormal; Notable for the following components:   pH, Arterial 7.279 (*)    pCO2 arterial 62.5 (*)    pO2, Arterial 74.5 (*)    Acid-Base Excess 2.1 (*)    All other components within normal limits  URINALYSIS, ROUTINE W REFLEX MICROSCOPIC - Abnormal; Notable for the following components:   Color, Urine AMBER (*)    APPearance HAZY (*)    Hgb urine dipstick SMALL (*)    Ketones, ur 5 (*)     Protein, ur 100 (*)    All other components within normal limits  BRAIN NATRIURETIC PEPTIDE - Abnormal; Notable for the following components:   B Natriuretic Peptide 489.0 (*)    All other components within normal limits  RAPID URINE DRUG SCREEN, HOSP PERFORMED - Abnormal; Notable for the following components:   Opiates POSITIVE (*)    All other components within normal limits  SARS CORONAVIRUS 2 BY RT PCR (HOSPITAL ORDER, [place])  CULTURE, BLOOD (ROUTINE X 2)  CULTURE, BLOOD (ROUTINE X 2)  URINE CULTURE  LACTIC ACID, PLASMA  PROTIME-INR  APTT  TROPONIN I (HIGH SENSITIVITY)  TROPONIN I (HIGH SENSITIVITY)    EKG EKG Interpretation  Date/Time:  Sunday April 25 2020 16:45:29 EDT Ventricular Rate:  96 PR Interval:    QRS Duration: 99 QT Interval:  332 QTC Calculation: 420 R Axis:   99 Text Interpretation: Atrial fibrillation Ventricular bigeminy Right axis deviation Nonspecific T abnormalities, diffuse leads Confirmed by Milton Ferguson [phone]) on 04/25/2020 6:39:43 PM   Radiology CT Head Wo Contrast  Result Date: 04/25/2020 CLINICAL DATA:  Status post fall. EXAM: CT HEAD WITHOUT CONTRAST TECHNIQUE: Contiguous axial images were obtained from the base of the skull through the vertex without intravenous contrast. COMPARISON:  April 14, 2020 FINDINGS: Brain: There is mild cerebral atrophy with widening of the extra-axial spaces and ventricular dilatation. There are areas of decreased attenuation within the white matter tracts of the supratentorial brain, consistent with microvascular disease changes. Vascular: No hyperdense vessel or unexpected calcification. Skull: Normal. Negative for fracture or focal lesion. Sinuses/Orbits: No acute finding. Other: There is mild right frontal scalp soft tissue swelling. IMPRESSION: Mild right frontal scalp soft  tissue swelling without evidence of an acute fracture or acute intracranial abnormality. Electronically  Signed   By: Virgina Norfolk M.D.   On: 04/25/2020 17:25   CT Cervical Spine Wo Contrast  Result Date: 04/25/2020 CLINICAL DATA:  Status post trauma. EXAM: CT CERVICAL SPINE WITHOUT CONTRAST TECHNIQUE: Multidetector CT imaging of the cervical spine was performed without intravenous contrast. Multiplanar CT image reconstructions were also generated. COMPARISON:  None. FINDINGS: Alignment: There is approximately 2 mm anterolisthesis of the C2 vertebral body on C3. Skull base and vertebrae: No acute fracture. No primary bone lesion or focal pathologic process. Soft tissues and spinal canal: No prevertebral fluid or swelling. No visible canal hematoma. Disc levels: Moderate to marked severity endplate sclerosis is seen at the levels of C3-C4, C4-C5, C5-C6, C6-C7 and C7-T1. Marked severity intervertebral disc space narrowing is seen from the level of C3 through T1. Marked severity bilateral multilevel facet joint hypertrophy is noted. Upper chest: Negative. Other: None. IMPRESSION: 1. No acute fracture within the cervical spine. 2. Marked severity multilevel degenerative disc disease and facet joint hypertrophy. Electronically Signed   By: Virgina Norfolk M.D.   On: 04/25/2020 17:27   DG Chest Port 1 View  Result Date: 04/25/2020 CLINICAL DATA:  Dyspnea, current smoker, COPD, frequent falls, altered mental status EXAM: PORTABLE CHEST 1 VIEW COMPARISON:  04/15/2019 chest radiograph. FINDINGS: Stable cardiomediastinal silhouette with mild cardiomegaly. No pneumothorax. No pleural effusion. Patchy reticular opacities at the lung bases are similar. Borderline mild pulmonary edema. IMPRESSION: 1. Mild cardiomegaly and borderline mild pulmonary edema. 2. Stable patchy reticular opacities at the lung bases, cannot exclude interstitial lung disease. Electronically Signed   By: Ilona Sorrel M.D.   On: 04/25/2020 17:35    Procedures Procedures (including critical care time)  Medications Ordered in ED Medications   lactated ringers infusion (has no administration in time range)  vancomycin (VANCOREADY) IVPB 2000 mg/400 mL (2,000 mg Intravenous New Bag/Given 04/25/20 1829)    Followed by  vancomycin (VANCOREADY) IVPB 1250 mg/250 mL (has no administration in time range)  ceFEPIme (MAXIPIME) 2 g in sodium chloride 0.9 % 100 mL IVPB (has no administration in time range)  sodium chloride 0.9 % bolus 1,000 mL (1,000 mLs Intravenous New Bag/Given 04/25/20 1747)  ceFEPIme (MAXIPIME) 2 g in sodium chloride 0.9 % 100 mL IVPB (0 g Intravenous Stopped 04/25/20 1829)    ED Course  I have reviewed the triage vital signs and the nursing notes.  Pertinent labs & imaging results that were available during my care of the patient were reviewed by me and considered in my medical decision making (see chart for details). CRITICAL CARE Performed by: Milton Ferguson Total critical care time: 45 minutes Critical care time was exclusive of separately billable procedures and treating other patients. Critical care was necessary to treat or prevent imminent or life-threatening deterioration. Critical care was time spent personally by me on the following activities: development of treatment plan with patient and/or surrogate as well as nursing, discussions with consultants, evaluation of patient's response to treatment, examination of patient, obtaining history from patient or surrogate, ordering and performing treatments and interventions, ordering and review of laboratory studies, ordering and review of radiographic studies, pulse oximetry and re-evaluation of patient's condition.    MDM Rules/Calculators/A&P                          Patient with altered mental status possibly related to CO2 retention and narcotic use.  She will be admitted to medicine      This patient presents to the ED for concern of fall this involves an extensive number of treatment options, and is a complaint that carries with it a high risk of  complications and morbidity.  The differential diagnosis includes stroke head injury   Lab Tests:   I Ordered, reviewed, and interpreted labs, which included CBC chemistries blood gas which showed elevated white count and CO2 retention  Medicines ordered:   I ordered medication antibiotics for septic protocol  Imaging Studies ordered:   I ordered imaging studies which included CT head and chest x-ray  I independently visualized and interpreted imaging which showed no severe head injury just soft tissue injury.  Chest x-ray questionable interstitial lung disease  Additional history obtained:   Additional history obtained from neighbor  Previous records obtained and reviewed.  Consultations Obtained:   I consulted hospitalist and discussed lab and imaging findings  Reevaluation:  After the interventions stated above, I reevaluated the patient and found improved  Critical Interventions:  .   Final Clinical Impression(s) / ED Diagnoses Final diagnoses:  Fall, initial encounter    Rx / DC Orders ED Discharge Orders    None       Milton Ferguson, MD 04/26/20 1127

## 2020-04-25 NOTE — Progress Notes (Signed)
Pharmacy Antibiotic Note  Meagan Holt is a 79 y.o. female admitted on 04/25/2020 with cellulitis.  Pharmacy has been consulted for Vancomycin and cefepime Plan: Cefepime 2gm IV q8h Vancomycin 2000mg  loading dose IV, then 1250mg  IV q12h  F/U cxs and clinical progress Monitor V/S, labs and levels as indicated  Height: 5\' 7"  (170.2 cm) Weight: 99.8 kg (220 lb 0.3 oz) IBW/kg (Calculated) : 61.6  Temp (24hrs), Avg:99.9 F (37.7 C), Min:99.9 F (37.7 C), Max:99.9 F (37.7 C)  Recent Labs  Lab 04/25/20 1746 04/25/20 1748  WBC 12.0*  --   CREATININE 0.72  --   LATICACIDVEN  --  1.3    Estimated Creatinine Clearance: 67 mL/min (by C-G formula based on SCr of 0.72 mg/dL).    Allergies  Allergen Reactions  . Latex Itching and Swelling  . Doxycycline Rash    Antimicrobials this admission: Cefepime 8/22>>  Vancomycin 8/22 >>   Dose adjustments this admission: prn  Microbiology results: 8/22 BCx: pending 8/22 UCx: pending  MRSA PCR:   Thank you for allowing pharmacy to be a part of this patient's care.  Isac Sarna, BS [place], [place] Clinical Pharmacist Pager [phone] 04/25/2020 6:36 PM

## 2020-04-25 NOTE — ED Triage Notes (Signed)
Pt brought in by [facility] for c/o ams and frequent falls; pt fell today was found by a neighbor, ems was called and pt refused to come to ED;  EMS was called again this afternoon and pt was brought to ED; pt has foul smelling urine

## 2020-04-26 DIAGNOSIS — W19XXXA Unspecified fall, initial encounter: Secondary | ICD-10-CM | POA: Diagnosis present

## 2020-04-26 DIAGNOSIS — G8929 Other chronic pain: Secondary | ICD-10-CM | POA: Diagnosis present

## 2020-04-26 DIAGNOSIS — I959 Hypotension, unspecified: Secondary | ICD-10-CM | POA: Diagnosis present

## 2020-04-26 DIAGNOSIS — J189 Pneumonia, unspecified organism: Secondary | ICD-10-CM | POA: Diagnosis present

## 2020-04-26 DIAGNOSIS — J438 Other emphysema: Secondary | ICD-10-CM | POA: Diagnosis not present

## 2020-04-26 DIAGNOSIS — H8110 Benign paroxysmal vertigo, unspecified ear: Secondary | ICD-10-CM | POA: Diagnosis present

## 2020-04-26 DIAGNOSIS — H548 Legal blindness, as defined in USA: Secondary | ICD-10-CM | POA: Diagnosis present

## 2020-04-26 DIAGNOSIS — D72829 Elevated white blood cell count, unspecified: Secondary | ICD-10-CM | POA: Diagnosis present

## 2020-04-26 DIAGNOSIS — Z8673 Personal history of transient ischemic attack (TIA), and cerebral infarction without residual deficits: Secondary | ICD-10-CM | POA: Diagnosis not present

## 2020-04-26 DIAGNOSIS — G9340 Encephalopathy, unspecified: Secondary | ICD-10-CM

## 2020-04-26 DIAGNOSIS — I4891 Unspecified atrial fibrillation: Secondary | ICD-10-CM | POA: Diagnosis present

## 2020-04-26 DIAGNOSIS — Z8619 Personal history of other infectious and parasitic diseases: Secondary | ICD-10-CM | POA: Diagnosis not present

## 2020-04-26 DIAGNOSIS — S0083XA Contusion of other part of head, initial encounter: Secondary | ICD-10-CM | POA: Diagnosis present

## 2020-04-26 DIAGNOSIS — G9341 Metabolic encephalopathy: Secondary | ICD-10-CM | POA: Diagnosis present

## 2020-04-26 DIAGNOSIS — M549 Dorsalgia, unspecified: Secondary | ICD-10-CM | POA: Diagnosis present

## 2020-04-26 DIAGNOSIS — K219 Gastro-esophageal reflux disease without esophagitis: Secondary | ICD-10-CM | POA: Diagnosis present

## 2020-04-26 DIAGNOSIS — J811 Chronic pulmonary edema: Secondary | ICD-10-CM | POA: Diagnosis present

## 2020-04-26 DIAGNOSIS — Z9181 History of falling: Secondary | ICD-10-CM | POA: Diagnosis not present

## 2020-04-26 DIAGNOSIS — Z87442 Personal history of urinary calculi: Secondary | ICD-10-CM | POA: Diagnosis not present

## 2020-04-26 DIAGNOSIS — F329 Major depressive disorder, single episode, unspecified: Secondary | ICD-10-CM | POA: Diagnosis present

## 2020-04-26 DIAGNOSIS — G92 Toxic encephalopathy: Secondary | ICD-10-CM | POA: Diagnosis present

## 2020-04-26 DIAGNOSIS — J9612 Chronic respiratory failure with hypercapnia: Secondary | ICD-10-CM | POA: Diagnosis present

## 2020-04-26 DIAGNOSIS — M25561 Pain in right knee: Secondary | ICD-10-CM | POA: Diagnosis present

## 2020-04-26 DIAGNOSIS — T40605A Adverse effect of unspecified narcotics, initial encounter: Secondary | ICD-10-CM | POA: Diagnosis present

## 2020-04-26 DIAGNOSIS — F17210 Nicotine dependence, cigarettes, uncomplicated: Secondary | ICD-10-CM | POA: Diagnosis present

## 2020-04-26 DIAGNOSIS — J449 Chronic obstructive pulmonary disease, unspecified: Secondary | ICD-10-CM | POA: Diagnosis present

## 2020-04-26 DIAGNOSIS — Y92009 Unspecified place in unspecified non-institutional (private) residence as the place of occurrence of the external cause: Secondary | ICD-10-CM | POA: Diagnosis not present

## 2020-04-26 DIAGNOSIS — Z20822 Contact with and (suspected) exposure to covid-19: Secondary | ICD-10-CM | POA: Diagnosis present

## 2020-04-26 DIAGNOSIS — R4182 Altered mental status, unspecified: Secondary | ICD-10-CM | POA: Diagnosis present

## 2020-04-26 LAB — BASIC METABOLIC PANEL
Anion gap: 7 (ref 5–15)
BUN: 18 mg/dL (ref 8–23)
CO2: 26 mmol/L (ref 22–32)
Calcium: 8.2 mg/dL — ABNORMAL LOW (ref 8.9–10.3)
Chloride: 107 mmol/L (ref 98–111)
Creatinine, Ser: 0.59 mg/dL (ref 0.44–1.00)
GFR calc Af Amer: >60 mL/min (ref >60)
GFR calc non Af Amer: >60 mL/min (ref >60)
Glucose, Bld: 111 mg/dL — ABNORMAL HIGH (ref 70–99)
Potassium: 4.1 mmol/L (ref 3.5–5.1)
Sodium: 140 mmol/L (ref 135–145)

## 2020-04-26 LAB — URINE CULTURE

## 2020-04-26 LAB — CBC
HCT: 36.7 % (ref 36.0–46.0)
Hemoglobin: 10.8 g/dL — ABNORMAL LOW (ref 12.0–15.0)
MCH: 29.8 pg (ref 26.0–34.0)
MCHC: 29.4 g/dL — ABNORMAL LOW (ref 30.0–36.0)
MCV: 101.4 fL — ABNORMAL HIGH (ref 80.0–100.0)
Platelets: 235 10*3/uL (ref 150–400)
RBC: 3.62 MIL/uL — ABNORMAL LOW (ref 3.87–5.11)
RDW: 14.5 % (ref 11.5–15.5)
WBC: 9.3 10*3/uL (ref 4.0–10.5)
nRBC: 0 % (ref 0.0–0.2)

## 2020-04-26 LAB — MRSA PCR SCREENING: MRSA by PCR: POSITIVE — AB

## 2020-04-26 MED ORDER — IPRATROPIUM-ALBUTEROL 0.5-2.5 (3) MG/3ML IN SOLN
3.0000 mL | Freq: Three times a day (TID) | RESPIRATORY_TRACT | Status: DC
  Administered 2020-04-27 (×2): 3 mL via RESPIRATORY_TRACT
  Filled 2020-04-26 (×2): qty 3

## 2020-04-26 MED ORDER — CHLORHEXIDINE GLUCONATE CLOTH 2 % EX PADS
6.0000 | MEDICATED_PAD | Freq: Every day | CUTANEOUS | Status: DC
  Administered 2020-04-27: 6 via TOPICAL

## 2020-04-26 MED ORDER — MUPIROCIN 2 % EX OINT
1.0000 "application " | TOPICAL_OINTMENT | Freq: Two times a day (BID) | CUTANEOUS | Status: DC
  Administered 2020-04-26 – 2020-04-27 (×2): 1 via NASAL
  Filled 2020-04-26 (×2): qty 22

## 2020-04-26 MED ORDER — IPRATROPIUM-ALBUTEROL 0.5-2.5 (3) MG/3ML IN SOLN
3.0000 mL | Freq: Four times a day (QID) | RESPIRATORY_TRACT | Status: DC
  Administered 2020-04-26 (×3): 3 mL via RESPIRATORY_TRACT
  Filled 2020-04-26 (×3): qty 3

## 2020-04-26 MED ORDER — MOMETASONE FURO-FORMOTEROL FUM 200-5 MCG/ACT IN AERO
2.0000 | INHALATION_SPRAY | Freq: Two times a day (BID) | RESPIRATORY_TRACT | Status: DC
  Filled 2020-04-26: qty 8.8

## 2020-04-26 MED ORDER — ALBUTEROL SULFATE (2.5 MG/3ML) 0.083% IN NEBU: 2.5000 mg | INHALATION_SOLUTION | RESPIRATORY_TRACT | Status: DC | PRN

## 2020-04-26 MED ORDER — CHLORHEXIDINE GLUCONATE CLOTH 2 % EX PADS
6.0000 | MEDICATED_PAD | Freq: Every day | CUTANEOUS | Status: DC
  Administered 2020-04-26 – 2020-04-27 (×2): 6 via TOPICAL

## 2020-04-26 NOTE — Progress Notes (Signed)
PROGRESS NOTE    Meagan Holt  GYI:[id number] DOB: Mar 02, 1941 DOA: 04/25/2020 PCP: [name], MD    Brief Narrative:  HPI: Meagan Holt is a 79 y.o. 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  Patient was found on the floor earlier in the day, by neighbor, EMS was called then, patient refused to come to the ED.  Later in the afternoon, neighbor went to check on patient again, found patient unresponsive. At the time of my evaluation, patient is sleepy, but arouses to voice, follows directions, answers questions appropriately.  Mental status appears to have improved since being in the ED.  Patient tells me she was brought to the hospital because she fell, was not able to get up.  She denies difficulty breathing.  Denies pain with urination.  She reports lower extremity swelling without pain, but is unable to tell me how long this has been going on.  Recent hospitalization 8/11-8/15 for severe sepsis secondary to bilateral pneumonia with acute on chronic hypoxic respiratory failure.  Evaluated by physical therapy, SNF recommended, but patient declined, despite increasing risk for falling at home, due to her visual impairment and use of narcotic medications and the need for home oxygen with a long cord.  ED Course: Temperature 99.9, O2 sats 93 to 94% on 3 L O2, WBC 12.  Troponin 17, UDS positive for opiates.  Lactic acid 1.3.  BNP 489 no prior to compare.  EKG shows atrial fibrillation rate 96.  Portable chest x-ray shows mild pulmonary edema, stable patchy reticular opacities at the lung bases cannot exclude interstitial lung disease.  Code sepsis was called, 1 L bolus normal saline was given, IV Vanco and cefepime was started for possible pneumonia.   Assessment & Plan:   Principal  Problem:   AMS (altered mental status) Active Problems:   COPD (chronic obstructive pulmonary disease) (HCC)   Depression   Benign paroxysmal positional vertigo   Acute encephalopathy   1. Metabolic encephalopathy.  Felt to be multifactorial in the setting of medications including narcotics, low blood pressure, mild hypercapnia.  Overall mental status appears to be improving.  Continue to hold sedative medications. 2. COPD.  Currently stable.  No shortness of breath or wheezing 3. Chronic atrial fibrillation.  Rate controlled.  Not on anticoagulation due to history of falls.  Continue on aspirin. 4. Multiple falls.  Imaging including head CT did not show any intracranial injury.  Will check x-ray of right knee since she is complaining of pain.  Seen by physical therapy with recommendation for skilled nursing facility placement 5. History of stroke.  Continue on aspirin and statins 6. Chronic pain.  Holding home dose of narcotics for now since she is still sleepy. 7. Possible HCAP.  Started on cefepime and vancomycin.  If she remains afebrile and does not have any shortness of breath or cough, will plan to discontinue in the next 24 hours.   DVT prophylaxis: SCDs Start: 04/25/20 2254  Code Status: Full code Family Communication: Discussed with patient Disposition Plan: Status is: Inpatient  Remains inpatient appropriate because:Unsafe d/c plan   Dispo: The patient is from: Home              Anticipated d/c is to: SNF  Anticipated d/c date is: 1 day              Patient currently is medically stable to d/c.     Consultants:     Procedures:     Antimicrobials:       Subjective: Feels sleepy.  Denies any shortness of breath.  Complains of pain in her right knee.  Objective: Vitals:   04/26/20 1336 04/26/20 1806 04/26/20 2011 04/26/20 2027  BP:  (!) 106/54 (!) 127/56   Pulse:  66 (!) 50   Resp:  19 18   Temp:  98 F (36.7 C) 99.1 F (37.3 C)    TempSrc:  Oral Oral   SpO2: 92% 97% 96% 94%  Weight:      Height:        Intake/Output Summary (Last 24 hours) at 04/26/2020 2101 Last data filed at 04/26/2020 0447 Gross per 24 hour  Intake 1158.58 ml  Output 400 ml  Net 758.58 ml   Filed Weights   04/25/20 1655  Weight: 99.8 kg    Examination:  General exam: Appears calm and comfortable  Respiratory system: Clear to auscultation. Respiratory effort normal. Cardiovascular system: S1 & S2 heard, RRR. No JVD, murmurs, rubs, gallops or clicks. No pedal edema. Gastrointestinal system: Abdomen is nondistended, soft and nontender. No organomegaly or masses felt. Normal bowel sounds heard. Central nervous system: Alert and oriented. No focal neurological deficits. Extremities: Right knee is tender to palpation Skin: Bruising noted around right thigh Psychiatry: Judgement and insight appear normal. Mood & affect appropriate.     Data Reviewed: I have personally reviewed following labs and imaging studies  CBC: Recent Labs  Lab 04/25/20 1746 04/26/20 0453  WBC 12.0* 9.3  NEUTROABS 10.1*  --   HGB 12.2 10.8*  HCT 41.8 36.7  MCV 101.5* 101.4*  PLT 236 086   Basic Metabolic Panel: Recent Labs  Lab 04/25/20 1746 04/26/20 0453  NA 139 140  K 4.0 4.1  CL 103 107  CO2 28 26  GLUCOSE 100* 111*  BUN 22 18  CREATININE 0.72 0.59  CALCIUM 8.8* 8.2*   GFR: Estimated Creatinine Clearance: 67 mL/min (by C-G formula based on SCr of 0.59 mg/dL). Liver Function Tests: Recent Labs  Lab 04/25/20 1746  AST 40  ALT 37  ALKPHOS 92  BILITOT 0.6  PROT 7.0  ALBUMIN 3.4*   No results for input(s): LIPASE, AMYLASE in the last 168 hours. No results for input(s): AMMONIA in the last 168 hours. Coagulation Profile: Recent Labs  Lab 04/25/20 1749  INR 1.1   Cardiac Enzymes: Recent Labs  Lab 04/25/20 1927  CKTOTAL 122   BNP (last 3 results) No results for input(s): PROBNP in the last 8760 hours. HbA1C: No results for  input(s): HGBA1C in the last 72 hours. CBG: No results for input(s): GLUCAP in the last 168 hours. Lipid Profile: No results for input(s): CHOL, HDL, LDLCALC, TRIG, CHOLHDL, LDLDIRECT in the last 72 hours. Thyroid Function Tests: No results for input(s): TSH, T4TOTAL, FREET4, T3FREE, THYROIDAB in the last 72 hours. Anemia Panel: No results for input(s): VITAMINB12, FOLATE, FERRITIN, TIBC, IRON, RETICCTPCT in the last 72 hours. Sepsis Labs: Recent Labs  Lab 04/25/20 1748  LATICACIDVEN 1.3    Recent Results (from the past 240 hour(s))  SARS Coronavirus 2 by RT PCR (hospital order, performed in [facility] hospital lab) Nasopharyngeal Nasopharyngeal Swab     Status: None   Collection Time: 04/18/20  2:00 PM   Specimen:  Nasopharyngeal Swab  Result Value Ref Range Status   SARS Coronavirus 2 NEGATIVE NEGATIVE Final    Comment: (NOTE) SARS-CoV-2 target nucleic acids are NOT DETECTED.  The SARS-CoV-2 RNA is generally detectable in upper and lower respiratory specimens during the acute phase of infection. The lowest concentration of SARS-CoV-2 viral copies this assay can detect is 250 copies / mL. A negative result does not preclude SARS-CoV-2 infection and should not be used as the sole basis for treatment or other patient management decisions.  A negative result may occur with improper specimen collection / handling, submission of specimen other than nasopharyngeal swab, presence of viral mutation(s) within the areas targeted by this assay, and inadequate number of viral copies (<250 copies / mL). A negative result must be combined with clinical observations, patient history, and epidemiological information.  Fact Sheet for Patients:   StrictlyIdeas.no  Fact Sheet for Healthcare Providers: BankingDealers.co.za  This test is not yet approved or  cleared by the [place] FDA and has been authorized for detection and/or diagnosis of  SARS-CoV-2 by FDA under an Emergency Use Authorization (EUA).  This EUA will remain in effect (meaning this test can be used) for the duration of the COVID-19 declaration under Section 564(b)(1) of the Act, 21 U.S.C. section 360bbb-3(b)(1), unless the authorization is terminated or revoked sooner.  Performed at [facility], [address] [place], [place] [address]   SARS Coronavirus 2 by RT PCR (hospital order, performed in [facility] hospital lab) Nasopharyngeal Urine, Clean Catch     Status: None   Collection Time: 04/25/20  5:03 PM   Specimen: Urine, Clean Catch; Nasopharyngeal  Result Value Ref Range Status   SARS Coronavirus 2 NEGATIVE NEGATIVE Final    Comment: (NOTE) SARS-CoV-2 target nucleic acids are NOT DETECTED.  The SARS-CoV-2 RNA is generally detectable in upper and lower respiratory specimens during the acute phase of infection. The lowest concentration of SARS-CoV-2 viral copies this assay can detect is 250 copies / mL. A negative result does not preclude SARS-CoV-2 infection and should not be used as the sole basis for treatment or other patient management decisions.  A negative result may occur with improper specimen collection / handling, submission of specimen other than nasopharyngeal swab, presence of viral mutation(s) within the areas targeted by this assay, and inadequate number of viral copies (<250 copies / mL). A negative result must be combined with clinical observations, patient history, and epidemiological information.  Fact Sheet for Patients:   StrictlyIdeas.no  Fact Sheet for Healthcare Providers: BankingDealers.co.za  This test is not yet approved or  cleared by the [place] FDA and has been authorized for detection and/or diagnosis of SARS-CoV-2 by FDA under an Emergency Use Authorization (EUA).  This EUA will remain in effect (meaning this test can be used) for the duration of the COVID-19  declaration under Section 564(b)(1) of the Act, 21 U.S.C. section 360bbb-3(b)(1), unless the authorization is terminated or revoked sooner.  Performed at [facility], [address] [place], [place] [address]   Blood Culture (routine x 2)     Status: None (Preliminary result)   Collection Time: 04/25/20  5:50 PM   Specimen: BLOOD  Result Value Ref Range Status   Specimen Description BLOOD RIGHT HAND  Final   Special Requests   Final    BOTTLES DRAWN AEROBIC AND ANAEROBIC Blood Culture adequate volume Performed at [facility], [address] [place], [place] [address]    Culture PENDING  Incomplete  Report Status PENDING  Incomplete  Blood Culture (routine x 2)     Status: None (Preliminary result)   Collection Time: 04/25/20  5:50 PM   Specimen: BLOOD  Result Value Ref Range Status   Specimen Description BLOOD LEFT ANTECUBITAL  Final   Special Requests   Final    BOTTLES DRAWN AEROBIC AND ANAEROBIC Blood Culture adequate volume Performed at [facility], [address] [place], [place] [address]    Culture PENDING  Incomplete   Report Status PENDING  Incomplete  MRSA PCR Screening     Status: Abnormal   Collection Time: 04/26/20 12:22 PM   Specimen: Nasal Mucosa; Nasopharyngeal  Result Value Ref Range Status   MRSA by PCR POSITIVE (A) NEGATIVE Final    Comment:        The GeneXpert MRSA Assay (FDA approved for NASAL specimens only), is one component of a comprehensive MRSA colonization surveillance program. It is not intended to diagnose MRSA infection nor to guide or monitor treatment for MRSA infections. CRITICAL RESULT CALLED TO, READ BACK BY AND VERIFIED WITH: RACHEL TATE,RN @1639  04/26/2020 KAY Performed at [facility], [address], [place], [place] [address]          Radiology Studies: CT Head Wo Contrast  Result Date: 04/25/2020 CLINICAL DATA:  Status post fall. EXAM: CT HEAD WITHOUT CONTRAST TECHNIQUE: Contiguous axial images were obtained from  the base of the skull through the vertex without intravenous contrast. COMPARISON:  April 14, 2020 FINDINGS: Brain: There is mild cerebral atrophy with widening of the extra-axial spaces and ventricular dilatation. There are areas of decreased attenuation within the white matter tracts of the supratentorial brain, consistent with microvascular disease changes. Vascular: No hyperdense vessel or unexpected calcification. Skull: Normal. Negative for fracture or focal lesion. Sinuses/Orbits: No acute finding. Other: There is mild right frontal scalp soft tissue swelling. IMPRESSION: Mild right frontal scalp soft tissue swelling without evidence of an acute fracture or acute intracranial abnormality. Electronically Signed   By: Virgina Norfolk M.D.   On: 04/25/2020 17:25   CT Cervical Spine Wo Contrast  Result Date: 04/25/2020 CLINICAL DATA:  Status post trauma. EXAM: CT CERVICAL SPINE WITHOUT CONTRAST TECHNIQUE: Multidetector CT imaging of the cervical spine was performed without intravenous contrast. Multiplanar CT image reconstructions were also generated. COMPARISON:  None. FINDINGS: Alignment: There is approximately 2 mm anterolisthesis of the C2 vertebral body on C3. Skull base and vertebrae: No acute fracture. No primary bone lesion or focal pathologic process. Soft tissues and spinal canal: No prevertebral fluid or swelling. No visible canal hematoma. Disc levels: Moderate to marked severity endplate sclerosis is seen at the levels of C3-C4, C4-C5, C5-C6, C6-C7 and C7-T1. Marked severity intervertebral disc space narrowing is seen from the level of C3 through T1. Marked severity bilateral multilevel facet joint hypertrophy is noted. Upper chest: Negative. Other: None. IMPRESSION: 1. No acute fracture within the cervical spine. 2. Marked severity multilevel degenerative disc disease and facet joint hypertrophy. Electronically Signed   By: Virgina Norfolk M.D.   On: 04/25/2020 17:27   DG Chest Port 1  View  Result Date: 04/25/2020 CLINICAL DATA:  Dyspnea, current smoker, COPD, frequent falls, altered mental status EXAM: PORTABLE CHEST 1 VIEW COMPARISON:  04/15/2019 chest radiograph. FINDINGS: Stable cardiomediastinal silhouette with mild cardiomegaly. No pneumothorax. No pleural effusion. Patchy reticular opacities at the lung bases are similar. Borderline mild pulmonary edema. IMPRESSION: 1. Mild cardiomegaly and borderline mild pulmonary edema. 2. Stable patchy reticular opacities at  the lung bases, cannot exclude interstitial lung disease. Electronically Signed   By: Ilona Sorrel M.D.   On: 04/25/2020 17:35        Scheduled Meds: . aspirin EC  325 mg Oral Q breakfast  . atorvastatin  20 mg Oral Daily  . Chlorhexidine Gluconate Cloth  6 each Topical Daily  . [START ON 04/27/2020] Chlorhexidine Gluconate Cloth  6 each Topical Q0600  . [START ON 04/27/2020] ipratropium-albuterol  3 mL Nebulization TID  . mupirocin ointment  1 application Nasal BID   Continuous Infusions: . ceFEPime (MAXIPIME) IV 2 g (04/26/20 1426)  . vancomycin 1,250 mg (04/26/20 1744)     LOS: 0 days    Time spent: 70mins    Kathie Dike, MD [facility]   If 7PM-7AM, please contact night-coverage [email or web address]  04/26/2020, 9:01 PM

## 2020-04-26 NOTE — Progress Notes (Signed)
CRITICAL VALUE ALERT  Critical Value:  MRSA positive  Date & Time Notied:  04/26/2020 1640  Provider Notified: Dr Roderic Palau  Orders Received/Actions taken: MRSA order set placed per protocol

## 2020-04-26 NOTE — NC FL2 (Signed)
[place] LEVEL OF CARE SCREENING TOOL     IDENTIFICATION  Patient Name: Meagan Holt Birthdate: [birth year] Sex: female Admission Date (Current Location): 04/25/2020  [facility] and [place] Number:  [employer] and Address:  [place] [address] [place]      Provider Number: [id number]  Attending Physician Name and Address:  Kathie Dike, MD  Relative Name and Phone Number:  Flavia Shipper (Niece)  [phone]    Current Level of Care: Hospital Recommended Level of Care: [place] Prior Approval Number:    Date Approved/Denied:   PASRR Number:    Discharge Plan: SNF    Current Diagnoses: Patient Active Problem List   Diagnosis Date Noted  . AMS (altered mental status) 04/25/2020  . 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 Severe sepsis due to bilateral pneumonia with acute on chronic hypoxic respiratory failure 04/18/2020  . CAP (community acquired pneumonia) 04/14/2020  . Acute on chronic respiratory failure with hypoxia ([place]) 04/14/2020  . Recurrent falls 04/14/2020  . Atrial fibrillation with RVR ([place]) 04/14/2020  . Leukocytosis 04/14/2020  . Obesity (BMI 30.0-34.9) 04/14/2020  . Benign paroxysmal positional vertigo 09/08/2018  . Cellulitis of right lower leg 09/08/2018  . Orthostatic hypotension 05/22/2017  . Syncope and collapse 05/21/2017  . Chronic pain syndrome 05/21/2017  . Altered mental status 05/20/2017  . GERD (gastroesophageal reflux disease) 05/20/2017  . Depression 05/20/2017  . History of stroke 05/20/2017  . Premature beats 05/20/2017  . Cervical spondylosis without myelopathy 05/15/2017  . Lumbar spondylosis 05/15/2017  . 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 Decreased range of motion of wrist 11/28/2016  . Anxiety 05/10/2016  . Head injury 07/30/2013  . Difficulty walking 07/30/2013  . COPD (chronic obstructive pulmonary disease) ([place]) 07/30/2013     Orientation RESPIRATION BLADDER Height & Weight     Self, Time, Situation, Place  O2 (2L) Continent Weight: 99.8 kg Height:  5\' 7"  (170.2 cm)  BEHAVIORAL SYMPTOMS/MOOD NEUROLOGICAL BOWEL NUTRITION STATUS      Continent Diet (see DC summary)  AMBULATORY STATUS COMMUNICATION OF NEEDS Skin   Extensive Assist Verbally Skin abrasions, Bruising (generalized)                       Personal Care Assistance Level of Assistance  Bathing, Feeding, Dressing Bathing Assistance: Maximum assistance Feeding assistance: Limited assistance Dressing Assistance: Maximum assistance     Functional Limitations Info  Sight, Hearing, Speech Sight Info: Impaired Hearing Info: Impaired Speech Info: Adequate    SPECIAL CARE FACTORS FREQUENCY  PT (By licensed PT)     PT Frequency: 5 times a week              Contractures Contractures Info: Not present    Additional Factors Info  Code Status, Allergies Code Status Info: Full Allergies Info: Latex,Doyycycline           Current Medications (04/26/2020):  This is the current hospital active medication list Current Facility-Administered Medications  Medication Dose Route Frequency Provider Last Rate Last Admin  . acetaminophen (TYLENOL) tablet 650 mg  650 mg Oral Q6H PRN Emokpae, Ejiroghene E, MD       Or  . acetaminophen (TYLENOL) suppository 650 mg  650 mg Rectal Q6H PRN Emokpae, Ejiroghene E, MD      . albuterol (PROVENTIL) (2.5 MG/3ML) 0.083% nebulizer solution 2.5 mg  2.5 mg Nebulization Q4H PRN Emokpae, Ejiroghene E,  MD      . aspirin EC tablet 325 mg  325 mg Oral Q breakfast Emokpae, Ejiroghene E, MD   325 mg at 04/26/20 0956  . atorvastatin (LIPITOR) tablet 20 mg  20 mg Oral Daily Emokpae, Ejiroghene E, MD   20 mg at 04/26/20 0956  . ceFEPIme (MAXIPIME) 2 g in sodium chloride 0.9 % 100 mL IVPB  2 g Intravenous Q8H Emokpae, Ejiroghene E, MD 200 mL/hr at 04/26/20 1426 2 g at 04/26/20 1426  . Chlorhexidine Gluconate Cloth 2 %  PADS 6 each  6 each Topical Daily Kathie Dike, MD   6 each at 04/26/20 [phone]  . ipratropium-albuterol (DUONEB) 0.5-2.5 (3) MG/3ML nebulizer solution 3 mL  3 mL Nebulization Q6H Emokpae, Ejiroghene E, MD   3 mL at 04/26/20 1336  . ondansetron (ZOFRAN) tablet 4 mg  4 mg Oral Q6H PRN Emokpae, Ejiroghene E, MD       Or  . ondansetron (ZOFRAN) injection 4 mg  4 mg Intravenous Q6H PRN Emokpae, Ejiroghene E, MD      . vancomycin (VANCOREADY) IVPB 1250 mg/250 mL  1,250 mg Intravenous Q12H Emokpae, Ejiroghene E, MD 166.7 mL/hr at 04/26/20 0610 1,250 mg at 04/26/20 9604     Discharge Medications: Please see discharge summary for a list of discharge medications.  Relevant Imaging Results:  Relevant Lab Results:   Additional Information SS# [id number]  Boneta Lucks, RN

## 2020-04-26 NOTE — TOC Initial Note (Addendum)
Transition of Care [facility]) - Initial/Assessment Note    Patient Details  Name: Meagan Holt MRN: [id number] Date of Birth: Sep 30, 1940  [facility] CM/SW Contact:    Boneta Lucks, RN Phone Number: 04/26/2020, 3:22 PM  Clinical Narrative:   Patient  Returns with AMS, discharged last week back to her apartment with Home health care services. Hermine Messick apartment Freight forwarder at [employer] called. She feels patient is unsafe to live alone. She called APS, Colletta Maryland is APS SW. Per Judson Roch, neighbors take care of the patient and her dog.  She clean for her, lay out her medication. However patient vision has decrease and she does not see her meds so she does not take them. Her memory is declining and they find her on the floor everyday.   TOC called Niece, she lives at  , takes care for her mother and can not come to care for the patient.  They are in agreement she needs to go to SNF and needs a long term plan, but can not come to help with that.  Patient is agreeable to SNF this admission. FL2 completed and sent out. Patient requesting [facility], advised they are not take admission this week, TOC sent out and will review bed offer with patient when available.             Addendum:  Referred to Tito Dine for Long term medicaid application. Updated Niece.  Patient Goals and CMS Choice    SNF    Expected Discharge Plan and Services    Prior Living Arrangements/Services  - Apartment   Activities of Daily Living Home Assistive Devices/Equipment: None ADL Screening (condition at time of admission) Patient's cognitive ability adequate to safely complete daily activities?: Yes Is the patient deaf or have difficulty hearing?: Yes Does the patient have difficulty seeing, even when wearing glasses/contacts?: Yes (Patient stated she is BLIND) Does the patient have difficulty concentrating, remembering, or making decisions?: Yes Patient able to express need for assistance  with ADLs?: Yes Does the patient have difficulty dressing or bathing?: No Independently performs ADLs?: Yes (appropriate for developmental age) Does the patient have difficulty walking or climbing stairs?: Yes Weakness of Legs: Both Weakness of Arms/Hands: None   Emotional Assessment     Admission diagnosis:  Fall, initial encounter [W19.XXXA] AMS (altered mental status) [R41.82] Patient Active Problem List   Diagnosis Date Noted  . AMS (altered mental status) 04/25/2020  . 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 Severe sepsis due to bilateral pneumonia with acute on chronic hypoxic respiratory failure 04/18/2020  . CAP (community acquired pneumonia) 04/14/2020  . Acute on chronic respiratory failure with hypoxia ([place]) 04/14/2020  . Recurrent falls 04/14/2020  . Atrial fibrillation with RVR ([place]) 04/14/2020  . Leukocytosis 04/14/2020  . Obesity (BMI 30.0-34.9) 04/14/2020  . Benign paroxysmal positional vertigo 09/08/2018  . Cellulitis of right lower leg 09/08/2018  . Orthostatic hypotension 05/22/2017  . Syncope and collapse 05/21/2017  . Chronic pain syndrome 05/21/2017  . Altered mental status 05/20/2017  . GERD (gastroesophageal reflux disease) 05/20/2017  . Depression 05/20/2017  . History of stroke 05/20/2017  . Premature beats 05/20/2017  . Cervical spondylosis without myelopathy 05/15/2017  . Lumbar spondylosis 05/15/2017  . 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 Decreased range of motion of wrist 11/28/2016  . Anxiety 05/10/2016  . Head injury 07/30/2013  . Difficulty walking 07/30/2013  . COPD (chronic obstructive pulmonary disease) ([place]) 07/30/2013  PCP:  [name], MD Pharmacy:   [facility], [place] [address] Phone: [phone] Fax: [phone]  [id number] [place], [place] - [address]  [place] [address] Phone: [phone] Fax: [phone]   Readmission Risk Interventions Readmission Risk Prevention Plan 04/16/2020  Medication Screening Complete  Transportation Screening Complete  Some recent data might be hidden

## 2020-04-27 ENCOUNTER — Inpatient Hospital Stay (HOSPITAL_COMMUNITY): Payer: Medicare Other

## 2020-04-27 DIAGNOSIS — H8110 Benign paroxysmal vertigo, unspecified ear: Secondary | ICD-10-CM

## 2020-04-27 MED ORDER — AMOXICILLIN-POT CLAVULANATE 875-125 MG PO TABS: 1.0000 | ORAL_TABLET | Freq: Two times a day (BID) | ORAL | 0 refills | Status: DC

## 2020-04-27 MED ORDER — HYDROCODONE-ACETAMINOPHEN 10-325 MG PO TABS
1.0000 | ORAL_TABLET | Freq: Three times a day (TID) | ORAL | Status: DC | PRN
  Administered 2020-04-27: 1 via ORAL
  Filled 2020-04-27: qty 1

## 2020-04-27 MED ORDER — ZOLPIDEM TARTRATE 5 MG PO TABS: 5.0000 mg | ORAL_TABLET | Freq: Every evening | ORAL | 0 refills | Status: DC | PRN

## 2020-04-27 MED ORDER — IPRATROPIUM-ALBUTEROL 0.5-2.5 (3) MG/3ML IN SOLN: 3.0000 mL | Freq: Three times a day (TID) | RESPIRATORY_TRACT | Status: AC

## 2020-04-27 MED ORDER — GABAPENTIN 600 MG PO TABS
600.0000 mg | ORAL_TABLET | Freq: Three times a day (TID) | ORAL | Status: DC
  Filled 2020-04-27 (×3): qty 1

## 2020-04-27 MED ORDER — GABAPENTIN 300 MG PO CAPS: 600.0000 mg | ORAL_CAPSULE | Freq: Three times a day (TID) | ORAL | Status: DC

## 2020-04-27 MED ORDER — AMOXICILLIN-POT CLAVULANATE 875-125 MG PO TABS
1.0000 | ORAL_TABLET | Freq: Two times a day (BID) | ORAL | Status: DC
  Administered 2020-04-27: 1 via ORAL
  Filled 2020-04-27: qty 1

## 2020-04-27 MED ORDER — HYDROCODONE-ACETAMINOPHEN 10-325 MG PO TABS
1.0000 | ORAL_TABLET | Freq: Three times a day (TID) | ORAL | 0 refills | Status: DC | PRN
Start: 2020-04-27 — End: 2021-05-17

## 2020-04-27 NOTE — TOC Transition Note (Signed)
Transition of Care [facility]) - CM/SW Discharge Note   Patient Details  Name: Meagan Holt MRN: [id number] Date of Birth: [birth year]  Transition of Care [facility] CM/SW Contact:  Iona Beard, [place] Phone Number: 04/27/2020, 1:36 PM   Clinical Narrative:    TOC set up EMS to transfer pt to [facility]. TOC called pt's niece/ [name] to inform of discharge. Printed medical necessity. Clinicals sent in hub. RN to call report.    Final next level of care: Skilled Nursing Facility Barriers to Discharge: Barriers Resolved   Patient Goals and CMS Choice Patient states their goals for this hospitalization and ongoing recovery are:: Go to SNF. CMS Medicare.gov Compare Post Acute Care list provided to:: Patient    Discharge Placement              Patient chooses bed at: [facility] Patient to be transferred to facility by: EMS Name of family member notified: [name] Ramirez/Niece Patient and family notified of of transfer: 04/27/20  Discharge Plan and Services                                     Social Determinants of Health (SDOH) Interventions     Readmission Risk Interventions Readmission Risk Prevention Plan 04/16/2020  Medication Screening Complete  Transportation Screening Complete  Some recent data might be hidden

## 2020-04-27 NOTE — Plan of Care (Signed)

## 2020-04-27 NOTE — Discharge Summary (Signed)
Physician Discharge Summary  RISSIE SCULLEY OAC:[id number] DOB: 10-14-1940 DOA: 04/25/2020  PCP: [name], MD  Admit date: 04/25/2020 Discharge date: 04/27/2020  Admitted From: Home Disposition: Skilled nursing facility  Recommendations for Outpatient Follow-up:  1. Follow up with PCP in 1-2 weeks 2. Please obtain BMP/CBC in one week  Discharge Condition: Stable CODE STATUS: Full code Diet recommendation: Heart healthy  Brief/Interim Summary: WFU:XNATFTD J Wirenis an 79 y.o.femalewith medical history significant forCOPD with chronic respiratory failure on 2 L, atrial fibrillation, stroke, BPPV, depression,legally blind Patient was brought to the ED via EMS reports of frequent falls, and altered mental status.Patient was found on the floor earlier in the day, by neighbor, EMS was called then, patient refused to come to the ED. Later in the afternoon, neighbor went to check on patient again, found patient unresponsive. At the time of my evaluation, patient is sleepy, but arouses to voice, follows directions,answers questions appropriately. Mental status appears to have improved since being in the ED. Patient tells me she was brought to the hospital because she fell, was not able to get up. She denies difficulty breathing. Denies pain with urination. She reports lower extremity swelling without pain, but is unable to tell me how long this has been going on.  Recent hospitalization 8/11-8/15 forseveresepsis secondary to bilateral pneumonia with acute on chronic hypoxic respiratory failure. Evaluated by physical therapy, SNFrecommended, but patient declined,despite increasing risk for falling at home,due to her visual impairment and use of narcotic medications and the need forhomeoxygen with a long cord.  ED Course:Temperature 99.9, O2 sats 93 to 94% on 3 L O2, WBC 12. Troponin 17,UDS positive for opiates. Lactic acid 1.3. BNP 489 no prior to compare. EKG shows  atrial fibrillation rate 96. Portable chest x-ray shows mild pulmonary edema, stable patchy reticular opacities at the lung bases cannot exclude interstitial lung disease. Code sepsis was called, 1 L bolus normal saline was given,IV Vanco and cefepime was started for possible pneumonia.  Discharge Diagnoses:  Principal Problem:   AMS (altered mental status) Active Problems:   COPD (chronic obstructive pulmonary disease) (HCC)   Depression   Benign paroxysmal positional vertigo   Acute encephalopathy  1. Metabolic encephalopathy.  Felt to be multifactorial in the setting of medications including narcotics, low blood pressure, mild hypercapnia.  Overall mental status appears to be improving.    Her long-acting narcotics have been discontinued. 2. COPD.  Currently stable.  No shortness of breath or wheezing 3. Chronic atrial fibrillation.  Rate controlled.  Not on anticoagulation due to history of falls.  Continue on aspirin. 4. Multiple falls.  Imaging including head CT did not show any intracranial injury.    She complained of right knee pain.  Plain films did not show any fracture or dislocations.  Seen by physical therapy with recommendation for skilled nursing facility placement 5. History of stroke.  Continue on aspirin and statins 6. Chronic pain.    She is on gabapentin and hydrocodone.  Long-acting opiates have been discontinued.  Will resume gabapentin and hydrocodone.  Continue to monitor. 7. Possible HCAP.  Started on cefepime and vancomycin.  Patient remained afebrile, but she did report having a cough.  She has been transitioned to Augmentin to complete her course.   Discharge Instructions  Discharge Instructions    Diet - low sodium heart healthy   Complete by: As directed    Increase activity slowly   Complete by: As directed      Allergies as  of 04/27/2020      Reactions   Latex Itching, Swelling   Doxycycline Rash      Medication List    STOP taking these  medications   HYDROcodone Bitartrate ER 30 MG T24a     TAKE these medications   Advair Diskus 250-50 MCG/DOSE Aepb Generic drug: Fluticasone-Salmeterol Inhale 1 puff into the lungs 2 (two) times daily.   albuterol 108 (90 Base) MCG/ACT inhaler Commonly known as: VENTOLIN HFA Inhale 2 puffs into the lungs every 6 (six) hours as needed for wheezing or shortness of breath.   amoxicillin-clavulanate 875-125 MG tablet Commonly known as: AUGMENTIN Take 1 tablet by mouth every 12 (twelve) hours.   aspirin EC 325 MG tablet Take 1 tablet (325 mg total) by mouth daily with breakfast.   atorvastatin 20 MG tablet Commonly known as: LIPITOR Take 1 tablet (20 mg total) by mouth daily.   buPROPion 300 MG 24 hr tablet Commonly known as: WELLBUTRIN XL Take 300 mg by mouth at bedtime.   Fish Oil 1000 MG Caps Take 3 capsules by mouth daily.   FLUoxetine 10 MG capsule Commonly known as: PROZAC Take 1 capsule by mouth every morning.   furosemide 20 MG tablet Commonly known as: LASIX Take 20 mg by mouth daily as needed for fluid or edema.   gabapentin 600 MG tablet Commonly known as: NEURONTIN Take 600 mg by mouth 3 (three) times daily.   guaiFENesin 600 MG 12 hr tablet Commonly known as: MUCINEX Take 1 tablet (600 mg total) by mouth 2 (two) times daily.   HYDROcodone-acetaminophen 10-325 MG tablet Commonly known as: NORCO Take 1 tablet by mouth 3 (three) times daily as needed. What changed: reasons to take this   ICAPS PO Take 1 tablet by mouth daily.   ipratropium-albuterol 0.5-2.5 (3) MG/3ML Soln Commonly known as: DUONEB Take 3 mLs by nebulization 3 (three) times daily.   multivitamin with minerals Tabs tablet Take 1 tablet by mouth daily.   Systane 0.4-0.3 % Soln Generic drug: Polyethyl Glycol-Propyl Glycol Apply 1-2 drops to eye daily as needed (for dry eye relief).   zolpidem 5 MG tablet Commonly known as: AMBIEN Take 1 tablet (5 mg total) by mouth at bedtime as  needed for sleep. What changed:   when to take this  reasons to take this       Allergies  Allergen Reactions  . Latex Itching and Swelling  . Doxycycline Rash    Consultations:     Procedures/Studies: DG Knee 1-2 Views Right  Result Date: 04/27/2020 CLINICAL DATA:  Pain following fall EXAM: RIGHT KNEE - 1-2 VIEW COMPARISON:  April 14, 2020 FINDINGS: Frontal and lateral views were obtained. There is no fracture or dislocation. No joint effusion. 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 There is spurring in all compartments. There are foci of chondrocalcinosis. There are multiple foci of arterial vascular calcification. IMPRESSION: No acute fracture or dislocation. No joint effusion. Extensive osteoarthritic change, most severe laterally and in the patellofemoral joint region. There is chondrocalcinosis which may be seen with either osteoarthritis or calcium pyrophosphate deposition disease. Multiple foci of arterial vascular calcification noted. Electronically Signed   By: Lowella Grip III M.D.   On: 04/27/2020 08:11   CT Head Wo Contrast  Result Date: 04/25/2020 CLINICAL DATA:  Status post fall. EXAM: CT HEAD WITHOUT CONTRAST TECHNIQUE: Contiguous axial images were obtained from the base of the skull through the vertex  without intravenous contrast. COMPARISON:  April 14, 2020 FINDINGS: Brain: There is mild cerebral atrophy with widening of the extra-axial spaces and ventricular dilatation. There are areas of decreased attenuation within the white matter tracts of the supratentorial brain, consistent with microvascular disease changes. Vascular: No hyperdense vessel or unexpected calcification. Skull: Normal. Negative for fracture or focal lesion. Sinuses/Orbits: No acute finding. Other: There is mild right frontal scalp soft tissue swelling. IMPRESSION: Mild right frontal scalp soft tissue  swelling without evidence of an acute fracture or acute intracranial abnormality. Electronically Signed   By: Virgina Norfolk M.D.   On: 04/25/2020 17:25   CT Head Wo Contrast  Result Date: 04/14/2020 CLINICAL DATA:  79 year old female with head trauma. EXAM: CT HEAD WITHOUT CONTRAST TECHNIQUE: Contiguous axial images were obtained from the base of the skull through the vertex without intravenous contrast. COMPARISON:  Head CT dated 01/30/2019. FINDINGS: Evaluation of this exam is limited due to motion artifact. Brain: Mild age-related atrophy and chronic microvascular ischemic changes. There is no acute intracranial hemorrhage. No mass effect or midline shift no extra-axial fluid collection. Vascular: No hyperdense vessel or unexpected calcification. Skull: Normal. Negative for fracture or focal lesion. Sinuses/Orbits: No acute finding. Other: None IMPRESSION: 1. No acute intracranial pathology. 2. Mild age-related atrophy and chronic microvascular ischemic changes. Electronically Signed   By: Anner Crete M.D.   On: 04/14/2020 20:55   CT Cervical Spine Wo Contrast  Result Date: 04/25/2020 CLINICAL DATA:  Status post trauma. EXAM: CT CERVICAL SPINE WITHOUT CONTRAST TECHNIQUE: Multidetector CT imaging of the cervical spine was performed without intravenous contrast. Multiplanar CT image reconstructions were also generated. COMPARISON:  None. FINDINGS: Alignment: There is approximately 2 mm anterolisthesis of the C2 vertebral body on C3. Skull base and vertebrae: No acute fracture. No primary bone lesion or focal pathologic process. Soft tissues and spinal canal: No prevertebral fluid or swelling. No visible canal hematoma. Disc levels: Moderate to marked severity endplate sclerosis is seen at the levels of C3-C4, C4-C5, C5-C6, C6-C7 and C7-T1. Marked severity intervertebral disc space narrowing is seen from the level of C3 through T1. Marked severity bilateral multilevel facet joint hypertrophy is  noted. Upper chest: Negative. Other: None. IMPRESSION: 1. No acute fracture within the cervical spine. 2. Marked severity multilevel degenerative disc disease and facet joint hypertrophy. Electronically Signed   By: Virgina Norfolk M.D.   On: 04/25/2020 17:27   DG Chest Port 1 View  Result Date: 04/25/2020 CLINICAL DATA:  Dyspnea, current smoker, COPD, frequent falls, altered mental status EXAM: PORTABLE CHEST 1 VIEW COMPARISON:  04/15/2019 chest radiograph. FINDINGS: Stable cardiomediastinal silhouette with mild cardiomegaly. No pneumothorax. No pleural effusion. Patchy reticular opacities at the lung bases are similar. Borderline mild pulmonary edema. IMPRESSION: 1. Mild cardiomegaly and borderline mild pulmonary edema. 2. Stable patchy reticular opacities at the lung bases, cannot exclude interstitial lung disease. Electronically Signed   By: Ilona Sorrel M.D.   On: 04/25/2020 17:35   DG Chest Portable 1 View  Result Date: 04/14/2020 CLINICAL DATA:  Right hip and knee pain.  Fall. EXAM: PORTABLE CHEST 1 VIEW COMPARISON:  05/20/2017 FINDINGS: Cardiomegaly. Right upper lobe and patchy left perihilar and lower lobe airspace opacities concerning for pneumonia. No effusions. No acute bony abnormality. IMPRESSION: Patchy bilateral airspace opacities concerning for multifocal pneumonia. Electronically Signed   By: Rolm Baptise M.D.   On: 04/14/2020 18:35   DG Knee Complete 4 Views Right  Result Date: 04/14/2020 CLINICAL DATA:  Fall, right  knee pain EXAM: RIGHT KNEE - COMPLETE 4+ VIEW COMPARISON:  None. FINDINGS: Moderate tricompartment degenerative changes with joint space narrowing and spurring. Small joint effusion. No acute bony abnormality. Specifically, no fracture, subluxation, or dislocation. Vascular calcifications noted. IMPRESSION: Moderate degenerative changes. Small joint effusion. No acute bony abnormality. Electronically Signed   By: Rolm Baptise M.D.   On: 04/14/2020 18:36   ECHOCARDIOGRAM  COMPLETE  Result Date: 04/15/2020    ECHOCARDIOGRAM REPORT   Patient Name:   NOVAH NESSEL [facility] Date of Exam: 04/15/2020 Medical Rec #:  [id number]       Height:       67.0 in Accession #:    [id number]      Weight:       220.0 lb Date of Birth:  August 06, 1941       BSA:          2.106 m Patient Age:    79 years        BP:           100/69 mmHg Patient Gender: F               HR:           97 bpm. Exam Location:  [facility] Procedure: 2D Echo Indications:    Atrial Fibrillation 427.31 / I48.91  History:        Patient has prior history of Echocardiogram examinations, most                 recent 05/22/2017. COPD and Stroke, Arrythmias:Atrial                 Fibrillation, Signs/Symptoms:Syncope; Risk Factors:Current                 Smoker. GERD.  Sonographer:    Leavy Cella RDCS (AE) Referring Phys: [id number] OLADAPO ADEFESO IMPRESSIONS  1. Left ventricular ejection fraction, by estimation, is 60 to 65%. The left ventricle has normal function. The left ventricle has no regional wall motion abnormalities. There is mild left ventricular hypertrophy. Left ventricular diastolic parameters are indeterminate.  2. Right ventricular systolic function is moderately reduced. The right ventricular size is severely enlarged. There is moderately elevated pulmonary artery systolic pressure.  3. Left atrial size was severely dilated.  4. Right atrial size was severely dilated.  5. The mitral valve is normal in structure. No evidence of mitral valve regurgitation. No evidence of mitral stenosis.  6. The aortic valve is tricuspid. Aortic valve regurgitation is not visualized. No aortic stenosis is present.  7. The inferior vena cava is dilated in size with <50% respiratory variability, suggesting right atrial pressure of 15 mmHg. FINDINGS  Left Ventricle: Left ventricular ejection fraction, by estimation, is 60 to 65%. The left ventricle has normal function. The left ventricle has no regional wall motion abnormalities. The left ventricular  internal cavity size was normal in size. There is  mild left ventricular hypertrophy. Left ventricular diastolic parameters are indeterminate. Right Ventricle: The right ventricular size is severely enlarged. Right vetricular wall thickness was not assessed. Right ventricular systolic function is moderately reduced. There is moderately elevated pulmonary artery systolic pressure. The tricuspid regurgitant velocity is 3.19 m/s, and with an assumed right atrial pressure of 10 mmHg, the estimated right ventricular systolic pressure is 96.2 mmHg. Left Atrium: Left atrial size was severely dilated. Right Atrium: Right atrial size was severely dilated. Pericardium: There is no evidence of pericardial effusion. Mitral Valve: The mitral valve is normal in  structure. There is mild thickening of the mitral valve leaflet(s). There is mild calcification of the mitral valve leaflet(s). Mild mitral annular calcification. No evidence of mitral valve regurgitation. No evidence of mitral valve stenosis. Tricuspid Valve: The tricuspid valve is normal in structure. Tricuspid valve regurgitation is mild . No evidence of tricuspid stenosis. Aortic Valve: The aortic valve is tricuspid. . There is mild thickening and mild calcification of the aortic valve. Aortic valve regurgitation is not visualized. No aortic stenosis is present. Mild aortic valve annular calcification. There is mild thickening of the aortic valve. There is mild calcification of the aortic valve. Aortic valve mean gradient measures 3.4 mmHg. Aortic valve peak gradient measures 6.2 mmHg. Aortic valve area, by VTI measures 2.36 cm. Pulmonic Valve: The pulmonic valve was not well visualized. Pulmonic valve regurgitation is not visualized. No evidence of pulmonic stenosis. Aorta: The aortic root is normal in size and structure. Pulmonary Artery: Moderate pulmonary HTN, PASP 56 mmHg. Venous: The inferior vena cava is dilated in size with less than 50% respiratory  variability, suggesting right atrial pressure of 15 mmHg. IAS/Shunts: No atrial level shunt detected by color flow Doppler.  LEFT VENTRICLE PLAX 2D LVIDd:         4.24 cm  Diastology LVIDs:         2.38 cm  LV e' lateral:   10.20 cm/s LV PW:         1.25 cm  LV E/e' lateral: 11.4 LV IVS:        1.27 cm  LV e' medial:    8.25 cm/s LVOT diam:     2.20 cm  LV E/e' medial:  14.1 LV SV:         52 LV SV Index:   25 LVOT Area:     3.80 cm  RIGHT VENTRICLE RV S prime:     9.49 cm/s TAPSE (M-mode): 1.8 cm LEFT ATRIUM             Index       RIGHT ATRIUM           Index LA diam:        5.10 cm 2.42 cm/m  RA Area:     34.10 cm LA Vol (A2C):   68.4 ml 32.48 ml/m RA Volume:   137.00 ml 65.06 ml/m LA Vol (A4C):   90.8 ml 43.12 ml/m LA Biplane Vol: 81.2 ml 38.56 ml/m  AORTIC VALVE AV Area (Vmax):    2.41 cm AV Area (Vmean):   1.91 cm AV Area (VTI):     2.36 cm AV Vmax:           124.88 cm/s AV Vmean:          87.334 cm/s AV VTI:            0.221 m AV Peak Grad:      6.2 mmHg AV Mean Grad:      3.4 mmHg LVOT Vmax:         79.27 cm/s LVOT Vmean:        43.795 cm/s LVOT VTI:          0.137 m LVOT/AV VTI ratio: 0.62  AORTA Ao Root diam: 2.80 cm MITRAL VALVE                TRICUSPID VALVE MV Area (PHT): 4.36 cm     TR Peak grad:   40.7 mmHg MV Decel Time: 174 msec     TR Vmax:  319.00 cm/s MV E velocity: 116.00 cm/s                             SHUNTS                             Systemic VTI:  0.14 m                             Systemic Diam: 2.20 cm Carlyle Dolly MD Electronically signed by Carlyle Dolly MD Signature Date/Time: 04/15/2020/3:51:48 PM    Final    DG HIP UNILAT W OR W/O PELVIS 2-3 VIEWS RIGHT  Result Date: 04/14/2020 CLINICAL DATA:  Fall, right hip pain EXAM: DG HIP (WITH OR WITHOUT PELVIS) 2-3V RIGHT COMPARISON:  None. FINDINGS: Mild symmetric degenerative changes in the hips. Postoperative changes in the visualized lower lumbar spine. SI joints symmetric and unremarkable. No acute bony  abnormality. Specifically, no fracture, subluxation, or dislocation. IMPRESSION: No acute bony abnormality. Electronically Signed   By: Rolm Baptise M.D.   On: 04/14/2020 18:35      Subjective: She did not sleep well last night.  Reports that the bed was causing her to have flare of her chronic back pain.  Discharge Exam: Vitals:   04/26/20 2011 04/26/20 2027 04/27/20 0458 04/27/20 0841  BP: (!) 127/56  121/77   Pulse: (!) 50  84   Resp: 18  16   Temp: 99.1 F (37.3 C)  99 F (37.2 C)   TempSrc: Oral  Oral   SpO2: 96% 94% (!) 88% 94%  Weight:      Height:        General: Pt is alert, awake, not in acute distress  HEENT: Bruising around right eye Cardiovascular: RRR, S1/S2 +, no rubs, no gallops Respiratory: CTA bilaterally, no wheezing, no rhonchi Abdominal: Soft, NT, ND, bowel sounds + Extremities: no edema, no cyanosis    The results of significant diagnostics from this hospitalization (including imaging, microbiology, ancillary and laboratory) are listed below for reference.     Microbiology: Recent Results (from the past 240 hour(s))  SARS Coronavirus 2 by RT PCR (hospital order, performed in [facility] hospital lab) Nasopharyngeal Nasopharyngeal Swab     Status: None   Collection Time: 04/18/20  2:00 PM   Specimen: Nasopharyngeal Swab  Result Value Ref Range Status   SARS Coronavirus 2 NEGATIVE NEGATIVE Final    Comment: (NOTE) SARS-CoV-2 target nucleic acids are NOT DETECTED.  The SARS-CoV-2 RNA is generally detectable in upper and lower respiratory specimens during the acute phase of infection. The lowest concentration of SARS-CoV-2 viral copies this assay can detect is 250 copies / mL. A negative result does not preclude SARS-CoV-2 infection and should not be used as the sole basis for treatment or other patient management decisions.  A negative result may occur with improper specimen collection / handling, submission of specimen other than nasopharyngeal  swab, presence of viral mutation(s) within the areas targeted by this assay, and inadequate number of viral copies (<250 copies / mL). A negative result must be combined with clinical observations, patient history, and epidemiological information.  Fact Sheet for Patients:   StrictlyIdeas.no  Fact Sheet for Healthcare Providers: BankingDealers.co.za  This test is not yet approved or  cleared by the [place] FDA and has been authorized for detection and/or diagnosis of  SARS-CoV-2 by FDA under an Emergency Use Authorization (EUA).  This EUA will remain in effect (meaning this test can be used) for the duration of the COVID-19 declaration under Section 564(b)(1) of the Act, 21 U.S.C. section 360bbb-3(b)(1), unless the authorization is terminated or revoked sooner.  Performed at [facility], [address], [place], [place] [address]   SARS Coronavirus 2 by RT PCR (hospital order, performed in [facility] hospital lab) Nasopharyngeal Urine, Clean Catch     Status: None   Collection Time: 04/25/20  5:03 PM   Specimen: Urine, Clean Catch; Nasopharyngeal  Result Value Ref Range Status   SARS Coronavirus 2 NEGATIVE NEGATIVE Final    Comment: (NOTE) SARS-CoV-2 target nucleic acids are NOT DETECTED.  The SARS-CoV-2 RNA is generally detectable in upper and lower respiratory specimens during the acute phase of infection. The lowest concentration of SARS-CoV-2 viral copies this assay can detect is 250 copies / mL. A negative result does not preclude SARS-CoV-2 infection and should not be used as the sole basis for treatment or other patient management decisions.  A negative result may occur with improper specimen collection / handling, submission of specimen other than nasopharyngeal swab, presence of viral mutation(s) within the areas targeted by this assay, and inadequate number of viral copies (<250 copies / mL). A negative result must be  combined with clinical observations, patient history, and epidemiological information.  Fact Sheet for Patients:   StrictlyIdeas.no  Fact Sheet for Healthcare Providers: BankingDealers.co.za  This test is not yet approved or  cleared by the [place] FDA and has been authorized for detection and/or diagnosis of SARS-CoV-2 by FDA under an Emergency Use Authorization (EUA).  This EUA will remain in effect (meaning this test can be used) for the duration of the COVID-19 declaration under Section 564(b)(1) of the Act, 21 U.S.C. section 360bbb-3(b)(1), unless the authorization is terminated or revoked sooner.  Performed at [facility], [address] [place], [place] [address]   Urine culture     Status: Abnormal   Collection Time: 04/25/20  5:03 PM   Specimen: Urine, Catheterized  Result Value Ref Range Status   Specimen Description   Final    URINE, CATHETERIZED Performed at [facility], [address] [place], [place] [address]    Special Requests   Final    NONE Performed at [facility], [address] [place], [place] [address]    Culture MULTIPLE SPECIES PRESENT, SUGGEST RECOLLECTION (A)  Final   Report Status 04/26/2020 FINAL  Final  Blood Culture (routine x 2)     Status: None (Preliminary result)   Collection Time: 04/25/20  5:50 PM   Specimen: BLOOD  Result Value Ref Range Status   Specimen Description BLOOD RIGHT HAND  Final   Special Requests   Final    BOTTLES DRAWN AEROBIC AND ANAEROBIC Blood Culture adequate volume Performed at [facility], [address], [place], [place] [address]    Culture PENDING  Incomplete   Report Status PENDING  Incomplete  Blood Culture (routine x 2)     Status: None (Preliminary result)   Collection Time: 04/25/20  5:50 PM   Specimen: BLOOD  Result Value Ref Range Status   Specimen Description BLOOD LEFT ANTECUBITAL  Final   Special Requests   Final    BOTTLES DRAWN AEROBIC AND  ANAEROBIC Blood Culture adequate volume Performed at [facility], [address], [place], [place] [address]    Culture PENDING  Incomplete   Report  Status PENDING  Incomplete  MRSA PCR Screening     Status: Abnormal   Collection Time: 04/26/20 12:22 PM   Specimen: Nasal Mucosa; Nasopharyngeal  Result Value Ref Range Status   MRSA by PCR POSITIVE (A) NEGATIVE Final    Comment:        The GeneXpert MRSA Assay (FDA approved for NASAL specimens only), is one component of a comprehensive MRSA colonization surveillance program. It is not intended to diagnose MRSA infection nor to guide or monitor treatment for MRSA infections. CRITICAL RESULT CALLED TO, READ BACK BY AND VERIFIED WITH: RACHEL TATE,RN @1639  04/26/2020 KAY Performed at [facility], [address] [place], [place] [address]      Labs: BNP (last 3 results) Recent Labs    04/25/20 1748  BNP 604.5*   Basic Metabolic Panel: Recent Labs  Lab 04/25/20 1746 04/26/20 0453  NA 139 140  K 4.0 4.1  CL 103 107  CO2 28 26  GLUCOSE 100* 111*  BUN 22 18  CREATININE 0.72 0.59  CALCIUM 8.8* 8.2*   Liver Function Tests: Recent Labs  Lab 04/25/20 1746  AST 40  ALT 37  ALKPHOS 92  BILITOT 0.6  PROT 7.0  ALBUMIN 3.4*   No results for input(s): LIPASE, AMYLASE in the last 168 hours. No results for input(s): AMMONIA in the last 168 hours. CBC: Recent Labs  Lab 04/25/20 1746 04/26/20 0453  WBC 12.0* 9.3  NEUTROABS 10.1*  --   HGB 12.2 10.8*  HCT 41.8 36.7  MCV 101.5* 101.4*  PLT 236 235   Cardiac Enzymes: Recent Labs  Lab 04/25/20 1927  CKTOTAL 122   BNP: Invalid input(s): POCBNP CBG: No results for input(s): GLUCAP in the last 168 hours. D-Dimer No results for input(s): DDIMER in the last 72 hours. Hgb A1c No results for input(s): HGBA1C in the last 72 hours. Lipid Profile No results for input(s): CHOL, HDL, LDLCALC, TRIG, CHOLHDL, LDLDIRECT in the last 72 hours. Thyroid function studies No  results for input(s): TSH, T4TOTAL, T3FREE, THYROIDAB in the last 72 hours.  Invalid input(s): FREET3 Anemia work up No results for input(s): VITAMINB12, FOLATE, FERRITIN, TIBC, IRON, RETICCTPCT in the last 72 hours. Urinalysis    Component Value Date/Time   COLORURINE AMBER (A) 04/25/2020 1703   APPEARANCEUR HAZY (A) 04/25/2020 1703   LABSPEC 1.028 04/25/2020 1703   PHURINE 5.0 04/25/2020 1703   GLUCOSEU NEGATIVE 04/25/2020 1703   HGBUR SMALL (A) 04/25/2020 1703   BILIRUBINUR NEGATIVE 04/25/2020 1703   KETONESUR 5 (A) 04/25/2020 1703   PROTEINUR 100 (A) 04/25/2020 1703   UROBILINOGEN 0.2 07/30/2013 1831   NITRITE NEGATIVE 04/25/2020 1703   LEUKOCYTESUR NEGATIVE 04/25/2020 1703   Sepsis Labs Invalid input(s): PROCALCITONIN,  WBC,  LACTICIDVEN Microbiology Recent Results (from the past 240 hour(s))  SARS Coronavirus 2 by RT PCR (hospital order, performed in [place] hospital lab) Nasopharyngeal Nasopharyngeal Swab     Status: None   Collection Time: 04/18/20  2:00 PM   Specimen: Nasopharyngeal Swab  Result Value Ref Range Status   SARS Coronavirus 2 NEGATIVE NEGATIVE Final    Comment: (NOTE) SARS-CoV-2 target nucleic acids are NOT DETECTED.  The SARS-CoV-2 RNA is generally detectable in upper and lower respiratory specimens during the acute phase of infection. The lowest concentration of SARS-CoV-2 viral copies this assay can detect is 250 copies / mL. A negative result does not preclude SARS-CoV-2 infection and should not be used as the sole basis for treatment or other patient  management decisions.  A negative result may occur with improper specimen collection / handling, submission of specimen other than nasopharyngeal swab, presence of viral mutation(s) within the areas targeted by this assay, and inadequate number of viral copies (<250 copies / mL). A negative result must be combined with clinical observations, patient history, and epidemiological  information.  Fact Sheet for Patients:   StrictlyIdeas.no  Fact Sheet for Healthcare Providers: BankingDealers.co.za  This test is not yet approved or  cleared by the [place] FDA and has been authorized for detection and/or diagnosis of SARS-CoV-2 by FDA under an Emergency Use Authorization (EUA).  This EUA will remain in effect (meaning this test can be used) for the duration of the COVID-19 declaration under Section 564(b)(1) of the Act, 21 U.S.C. section 360bbb-3(b)(1), unless the authorization is terminated or revoked sooner.  Performed at [facility], [address], [place], [place] [address]   SARS Coronavirus 2 by RT PCR (hospital order, performed in [facility] hospital lab) Nasopharyngeal Urine, Clean Catch     Status: None   Collection Time: 04/25/20  5:03 PM   Specimen: Urine, Clean Catch; Nasopharyngeal  Result Value Ref Range Status   SARS Coronavirus 2 NEGATIVE NEGATIVE Final    Comment: (NOTE) SARS-CoV-2 target nucleic acids are NOT DETECTED.  The SARS-CoV-2 RNA is generally detectable in upper and lower respiratory specimens during the acute phase of infection. The lowest concentration of SARS-CoV-2 viral copies this assay can detect is 250 copies / mL. A negative result does not preclude SARS-CoV-2 infection and should not be used as the sole basis for treatment or other patient management decisions.  A negative result may occur with improper specimen collection / handling, submission of specimen other than nasopharyngeal swab, presence of viral mutation(s) within the areas targeted by this assay, and inadequate number of viral copies (<250 copies / mL). A negative result must be combined with clinical observations, patient history, and epidemiological information.  Fact Sheet for Patients:   StrictlyIdeas.no  Fact Sheet for Healthcare  Providers: BankingDealers.co.za  This test is not yet approved or  cleared by the [place] FDA and has been authorized for detection and/or diagnosis of SARS-CoV-2 by FDA under an Emergency Use Authorization (EUA).  This EUA will remain in effect (meaning this test can be used) for the duration of the COVID-19 declaration under Section 564(b)(1) of the Act, 21 U.S.C. section 360bbb-3(b)(1), unless the authorization is terminated or revoked sooner.  Performed at [facility], [address] [place], [place] [address]   Urine culture     Status: Abnormal   Collection Time: 04/25/20  5:03 PM   Specimen: Urine, Catheterized  Result Value Ref Range Status   Specimen Description   Final    URINE, CATHETERIZED Performed at [facility], [address], [place], [place] [address]    Special Requests   Final    NONE Performed at [facility], [address] [place], [place] [address]    Culture MULTIPLE SPECIES PRESENT, SUGGEST RECOLLECTION (A)  Final   Report Status 04/26/2020 FINAL  Final  Blood Culture (routine x 2)     Status: None (Preliminary result)   Collection Time: 04/25/20  5:50 PM   Specimen: BLOOD  Result Value Ref Range Status   Specimen Description BLOOD RIGHT HAND  Final   Special Requests   Final    BOTTLES DRAWN AEROBIC AND ANAEROBIC Blood Culture adequate volume Performed at [facility], [address], [place] [address]  Culture PENDING  Incomplete   Report Status PENDING  Incomplete  Blood Culture (routine x 2)     Status: None (Preliminary result)   Collection Time: 04/25/20  5:50 PM   Specimen: BLOOD  Result Value Ref Range Status   Specimen Description BLOOD LEFT ANTECUBITAL  Final   Special Requests   Final    BOTTLES DRAWN AEROBIC AND ANAEROBIC Blood Culture adequate volume Performed at [facility] [address] [place], [place] [address]    Culture PENDING  Incomplete   Report Status PENDING  Incomplete  MRSA  PCR Screening     Status: Abnormal   Collection Time: 04/26/20 12:22 PM   Specimen: Nasal Mucosa; Nasopharyngeal  Result Value Ref Range Status   MRSA by PCR POSITIVE (A) NEGATIVE Final    Comment:        The GeneXpert MRSA Assay (FDA approved for NASAL specimens only), is one component of a comprehensive MRSA colonization surveillance program. It is not intended to diagnose MRSA infection nor to guide or monitor treatment for MRSA infections. CRITICAL RESULT CALLED TO, READ BACK BY AND VERIFIED WITH: RACHEL TATE,RN @1639  04/26/2020 KAY Performed at [facility], [address] [place], [place] [address]      Time coordinating discharge: 62mins  SIGNED:   Kathie Dike, MD  [facility] 04/27/2020, 12:39 PM   If 7PM-7AM, please contact night-coverage [email or web address]

## 2020-04-30 LAB — CULTURE, BLOOD (ROUTINE X 2)
Culture: NO GROWTH
Culture: NO GROWTH
Special Requests: ADEQUATE
Special Requests: ADEQUATE

## 2020-06-27 DIAGNOSIS — E7849 Other hyperlipidemia: Secondary | ICD-10-CM | POA: Insufficient documentation

## 2020-06-27 DIAGNOSIS — F3341 Major depressive disorder, recurrent, in partial remission: Secondary | ICD-10-CM | POA: Insufficient documentation

## 2020-07-06 ENCOUNTER — Ambulatory Visit: Payer: Medicare Other | Admitting: Orthopaedic Surgery

## 2020-07-20 ENCOUNTER — Encounter: Payer: Self-pay | Admitting: Orthopaedic Surgery

## 2020-07-20 ENCOUNTER — Ambulatory Visit (INDEPENDENT_AMBULATORY_CARE_PROVIDER_SITE_OTHER): Payer: Medicare Other | Admitting: Orthopaedic Surgery

## 2020-07-20 ENCOUNTER — Other Ambulatory Visit: Payer: Self-pay

## 2020-07-20 DIAGNOSIS — M25561 Pain in right knee: Secondary | ICD-10-CM | POA: Diagnosis not present

## 2020-07-20 DIAGNOSIS — F17210 Nicotine dependence, cigarettes, uncomplicated: Secondary | ICD-10-CM | POA: Diagnosis not present

## 2020-07-20 DIAGNOSIS — G8929 Other chronic pain: Secondary | ICD-10-CM

## 2020-07-20 NOTE — Progress Notes (Signed)
PROCEDURE NOTE:  The patient requests injections of the right knee , verbal consent was obtained.  The right knee was prepped appropriately after time out was performed.   Sterile technique was observed and injection of 1 cc of Depo-Medrol 40 mg with several cc's of plain xylocaine. Anesthesia was provided by ethyl chloride and a 20-gauge needle was used to inject the knee area. The injection was tolerated well.  A band aid dressing was applied.  The patient was advised to apply ice later today and tomorrow to the injection sight as needed.  To see Dr. Aline Brochure for discussion about total knee on the right.  Electronically Signed Sanjuana Kava, MD 11/16/20211:48 PM

## 2020-08-16 ENCOUNTER — Ambulatory Visit: Payer: Medicare Other | Admitting: Orthopedic Surgery

## 2020-08-18 ENCOUNTER — Ambulatory Visit (INDEPENDENT_AMBULATORY_CARE_PROVIDER_SITE_OTHER): Payer: Medicare Other | Admitting: Orthopedic Surgery

## 2020-08-18 ENCOUNTER — Other Ambulatory Visit: Payer: Self-pay

## 2020-08-18 VITALS — BP 132/100 | HR 93 | Ht 66.0 in | Wt 202.0 lb

## 2020-08-18 DIAGNOSIS — M1711 Unilateral primary osteoarthritis, right knee: Secondary | ICD-10-CM

## 2020-08-18 NOTE — Progress Notes (Signed)
. NEW PROBLEM//OFFICE VISIT  Chief Complaint  Patient presents with  . Knee Pain    R/hurts when up moving around/Here to discuss surgery    79 year old female history of history of smoking, currently 1 pack/day smoker, COPD on 2 L of oxygen at home she also takes 2 medications for breathing she has history of atrial fibrillation chronic paroxysmal positional vertigo status post stroke presents for evaluation for possible right total knee  Patient is coming in today with test with a wheelchair.  She has 10 out of 10 pain lateral joint line right knee with progressive weakness deformity difficulty walking getting in and out of a chair and going up and down stairs.  She has a caregiver with her who is with her pretty much most of the time   Review of Systems  Constitutional: Negative for chills and fever.  Respiratory: Positive for shortness of breath.   Cardiovascular: Positive for palpitations.  Gastrointestinal: Negative.   Genitourinary: Negative.   Musculoskeletal: Positive for back pain.  Neurological: Positive for sensory change.  All other systems reviewed and are negative.    Past Medical History:  Diagnosis Date  . Arthritis    "all my joints" (07/30/2013)  . Chronic back pain   . Chronic bronchitis ([place])    "get it q year" (07/30/2013)  . COPD (chronic obstructive pulmonary disease) ([place])   . Depression   . Falls frequently    "fell twice in the last 2 days" (07/30/2013)  . GERD (gastroesophageal reflux disease)    "rarely" (07/30/2013)  . H/O hiatal hernia   . History of blood transfusion   . Kidney stones   . Stroke [facility])     Past Surgical History:  Procedure Laterality Date  . APPENDECTOMY    . BACK SURGERY     "put cages in mid back; did arthroscopic OR on lower back" (11/326/2014)  . BILATERAL OOPHORECTOMY Bilateral   . CATARACT EXTRACTION W/ INTRAOCULAR LENS  IMPLANT, BILATERAL Bilateral   . CHOLECYSTECTOMY    . EXCISIONAL HEMORRHOIDECTOMY    .  GASTRIC BYPASS    . HERNIA REPAIR     "removed my belly button too" (07/30/2013)  . KIDNEY STONE SURGERY     "twice" (07/30/2013)  . KNEE ARTHROSCOPY Left   . TONSILLECTOMY      Family History  Problem Relation Age of Onset  . Stomach cancer Father    Social History   Tobacco Use  . Smoking status: Current Every Day Smoker    Packs/day: 1.00    Years: 42.00    Pack years: 42.00    Types: Cigarettes  . Smokeless tobacco: Never Used  Substance Use Topics  . Alcohol use: Yes    Comment: rarely   . Drug use: No    Allergies  Allergen Reactions  . Latex Itching and Swelling  . Doxycycline Rash    Current Meds  Medication Sig  . albuterol (VENTOLIN HFA) 108 (90 Base) MCG/ACT inhaler Inhale 2 puffs into the lungs every 6 (six) hours as needed for wheezing or shortness of breath.  Marland Kitchen aspirin EC 325 MG tablet Take 1 tablet (325 mg total) by mouth daily with breakfast.  . atorvastatin (LIPITOR) 20 MG tablet Take 1 tablet (20 mg total) by mouth daily.  Marland Kitchen buPROPion (WELLBUTRIN XL) 300 MG 24 hr tablet Take 300 mg by mouth at bedtime.   . furosemide (LASIX) 20 MG tablet Take 20 mg by mouth daily as needed for fluid or edema.  Marland Kitchen  gabapentin (NEURONTIN) 600 MG tablet Take 600 mg by mouth 3 (three) times daily.  Marland Kitchen HYDROcodone-acetaminophen (NORCO) 10-325 MG tablet Take 1 tablet by mouth 3 (three) times daily as needed.  . Multiple Vitamins-Minerals (ICAPS PO) Take 1 tablet by mouth daily.  . Omega-3 Fatty Acids (FISH OIL) 1000 MG CAPS Take 3 capsules by mouth daily.  Vladimir Faster Glycol-Propyl Glycol 0.4-0.3 % SOLN Apply 1-2 drops to eye daily as needed (for dry eye relief).  Marland Kitchen zolpidem (AMBIEN) 5 MG tablet Take 1 tablet (5 mg total) by mouth at bedtime as needed for sleep.    BP (!) 132/100   Pulse 93   Ht 5\' 6"  (3.570 m)   Wt 202 lb (91.6 kg)   BMI 32.60 kg/m   Physical Exam Constitutional:      General: She is not in acute distress.    Appearance: Normal appearance. She is  normal weight. She is not ill-appearing, toxic-appearing or diaphoretic.  HENT:     Head: Normocephalic and atraumatic.     Nose: No congestion or rhinorrhea.     Mouth/Throat:     Mouth: Mucous membranes are moist.     Pharynx: Oropharynx is clear. No oropharyngeal exudate or posterior oropharyngeal erythema.  Eyes:     General: No scleral icterus.       Right eye: No discharge.        Left eye: No discharge.     Extraocular Movements: Extraocular movements intact.     Conjunctiva/sclera: Conjunctivae normal.     Pupils: Pupils are equal, round, and reactive to light.  Cardiovascular:     Rate and Rhythm: Normal rate and regular rhythm.     Pulses: Normal pulses.  Pulmonary:     Breath sounds: No wheezing.  Abdominal:     General: There is no distension.  Skin:    General: Skin is warm.     Capillary Refill: Capillary refill takes less than 2 seconds.  Neurological:     General: No focal deficit present.     Mental Status: She is alert. Mental status is at baseline.     Cranial Nerves: No cranial nerve deficit.     Coordination: Coordination normal.     Gait: Gait abnormal.     Deep Tendon Reflexes: Reflexes normal.  Psychiatric:        Mood and Affect: Mood normal.        Behavior: Behavior normal.        Thought Content: Thought content normal.        Judgment: Judgment normal.     Ortho Exam Right knee alignment is in valgus she has tenderness along the lateral joint line she has no effusion  The knee has no laxity in either plane sagittal or coronal.  She has the characteristic loss of full extension flexion is 115 degrees muscle tone seems normal skin is intact no rashes   MEDICAL DECISION MAKING  A.  Encounter Diagnosis  Name Primary?  . Primary osteoarthritis of right knee Yes   Patient Active Problem List   Diagnosis Date Noted  . Acute encephalopathy 04/26/2020  . AMS (altered mental status) 04/25/2020  . 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 Severe sepsis due  to bilateral pneumonia with acute on chronic hypoxic respiratory failure 04/18/2020  . CAP (community acquired pneumonia) 04/14/2020  . Acute on chronic respiratory failure with hypoxia ([place]) 04/14/2020  . Recurrent falls 04/14/2020  . Atrial fibrillation with RVR ([place]) 04/14/2020  . Leukocytosis 04/14/2020  .  Obesity (BMI 30.0-34.9) 04/14/2020  . Benign paroxysmal positional vertigo 09/08/2018  . Cellulitis of right lower leg 09/08/2018  . Orthostatic hypotension 05/22/2017  . Syncope and collapse 05/21/2017  . Chronic pain syndrome 05/21/2017  . Altered mental status 05/20/2017  . GERD (gastroesophageal reflux disease) 05/20/2017  . Depression 05/20/2017  . History of stroke 05/20/2017  . Premature beats 05/20/2017  . Cervical spondylosis without myelopathy 05/15/2017  . Lumbar spondylosis 05/15/2017  . 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 Decreased range of motion of wrist 11/28/2016  . Anxiety 05/10/2016  . Head injury 07/30/2013  . Difficulty walking 07/30/2013  . COPD (chronic obstructive pulmonary disease) ([place]) 07/30/2013   B. DATA ANALYSED:  Definitive management involves a right total knee arthroplasty   IMAGING: Interpretation of images: Prior images AP lateral show valgus knee moderate deformity narrowing and obliteration of the lateral joint line with osteopenia and what I would call mild osteophytes  Patient will need another x-ray series prior to surgery   Orders: no  Outside records reviewed: Previous records by Dr. Luna Glasgow patient had an injection in November   C. MANAGEMENT   At this point the patient cannot have surgery due to Covid restrictions were only admitting 1 patient per week and were into February on our schedule already.  I am concerned about her COPD, persistent falls, disc disease, previous stroke and atrial fibrillation  Patient will need a extensive medical work-up prior to surgery if it can be done in  [place].  Patient will go on a list to be seen again if something opens up and then we can get her preop medical evaluation done at that time  No orders of the defined types were placed in this encounter.     Arther Abbott, MD  08/18/2020 2:31 PM

## 2020-08-18 NOTE — Patient Instructions (Signed)
Patient would like to go on the wait list for surgery  See Dr. Luna Glasgow for any issues until patient call from the wait list

## 2020-09-14 ENCOUNTER — Other Ambulatory Visit: Payer: Self-pay

## 2020-09-14 ENCOUNTER — Encounter: Payer: Self-pay | Admitting: Orthopaedic Surgery

## 2020-09-14 ENCOUNTER — Ambulatory Visit (INDEPENDENT_AMBULATORY_CARE_PROVIDER_SITE_OTHER): Payer: Medicare Other | Admitting: Orthopaedic Surgery

## 2020-09-14 DIAGNOSIS — M25561 Pain in right knee: Secondary | ICD-10-CM

## 2020-09-14 DIAGNOSIS — F17210 Nicotine dependence, cigarettes, uncomplicated: Secondary | ICD-10-CM

## 2020-09-14 DIAGNOSIS — G8929 Other chronic pain: Secondary | ICD-10-CM | POA: Diagnosis not present

## 2020-09-14 NOTE — Progress Notes (Signed)
PROCEDURE NOTE:  The patient requests injections of the right knee , verbal consent was obtained.  The right knee was prepped appropriately after time out was performed.   Sterile technique was observed and injection of 1 cc of Depo-Medrol 40 mg with several cc's of plain xylocaine. Anesthesia was provided by ethyl chloride and a 20-gauge needle was used to inject the knee area. The injection was tolerated well.  A band aid dressing was applied.  The patient was advised to apply ice later today and tomorrow to the injection sight as needed.  Return in six weeks.  Electronically Signed Sanjuana Kava, MD 1/11/20223:01 PM

## 2020-10-26 ENCOUNTER — Ambulatory Visit (INDEPENDENT_AMBULATORY_CARE_PROVIDER_SITE_OTHER): Payer: Medicare Other | Admitting: Orthopaedic Surgery

## 2020-10-26 ENCOUNTER — Other Ambulatory Visit: Payer: Self-pay

## 2020-10-26 ENCOUNTER — Encounter: Payer: Self-pay | Admitting: Orthopaedic Surgery

## 2020-10-26 VITALS — Ht 66.0 in | Wt 198.0 lb

## 2020-10-26 DIAGNOSIS — F17210 Nicotine dependence, cigarettes, uncomplicated: Secondary | ICD-10-CM

## 2020-10-26 DIAGNOSIS — M25561 Pain in right knee: Secondary | ICD-10-CM | POA: Diagnosis not present

## 2020-10-26 DIAGNOSIS — G8929 Other chronic pain: Secondary | ICD-10-CM

## 2020-10-26 NOTE — Progress Notes (Signed)
PROCEDURE NOTE:  The patient requests injections of the right knee , verbal consent was obtained.  The right knee was prepped appropriately after time out was performed.   Sterile technique was observed and injection of 1 cc of Celestone 6 mg with several cc's of plain xylocaine. Anesthesia was provided by ethyl chloride and a 20-gauge needle was used to inject the knee area. The injection was tolerated well.  A band aid dressing was applied.  The patient was advised to apply ice later today and tomorrow to the injection sight as needed.  She would like to begin viscosupplementation.   I will see her in one week to begin.  Call if any problem.  Precautions discussed.   Electronically Signed Sanjuana Kava, MD 2/22/20222:54 PM

## 2020-11-02 ENCOUNTER — Encounter: Payer: Self-pay | Admitting: Orthopaedic Surgery

## 2020-11-02 ENCOUNTER — Other Ambulatory Visit: Payer: Self-pay

## 2020-11-02 ENCOUNTER — Ambulatory Visit (INDEPENDENT_AMBULATORY_CARE_PROVIDER_SITE_OTHER): Payer: Medicare Other | Admitting: Orthopaedic Surgery

## 2020-11-02 DIAGNOSIS — G8929 Other chronic pain: Secondary | ICD-10-CM

## 2020-11-02 DIAGNOSIS — M1711 Unilateral primary osteoarthritis, right knee: Secondary | ICD-10-CM

## 2020-11-02 DIAGNOSIS — F17210 Nicotine dependence, cigarettes, uncomplicated: Secondary | ICD-10-CM

## 2020-11-02 NOTE — Progress Notes (Signed)
This patient is diagnosed with osteoarthritis of the knee(s).    Radiographs show evidence of joint space narrowing, osteophytes, subchondral sclerosis and/or subchondral cysts.  This patient has knee pain which interferes with functional and activities of daily living.    This patient has experienced inadequate response, adverse effects and/or intolerance with conservative treatments such as acetaminophen, NSAIDS, topical creams, physical therapy or regular exercise, knee bracing and/or weight loss.   This patient has experienced inadequate response or has a contraindication to intra articular steroid injections for at least 3 months.   This patient is not scheduled to have a total knee replacement within 6 months of starting treatment with viscosupplementation.  PROCEDURE NOTE:  Orthovisc injection #  1 of 3   Injection 1 vial of Orthovisc into right  knee  There were no vitals taken for this visit.  The right knee exam: there was no synovitis or infection   The knee was prepped sterilely  Ethyl chloride was used to anesthetize the skin A 20 g needle was used to inject the knee with 1 vial of Orthovisc A sterile dressing was placed  There were no complications  Encounter Diagnoses  Name Primary?  . Chronic pain of right knee Yes  . Nicotine dependence, cigarettes, uncomplicated   . Primary osteoarthritis of right knee     Follow up one week  Call if any problem.  Precautions discussed.   Electronically Signed Sanjuana Kava, MD 3/1/20222:31 PM

## 2020-11-09 ENCOUNTER — Other Ambulatory Visit: Payer: Self-pay

## 2020-11-09 ENCOUNTER — Ambulatory Visit (INDEPENDENT_AMBULATORY_CARE_PROVIDER_SITE_OTHER): Payer: Medicare Other | Admitting: Orthopaedic Surgery

## 2020-11-09 ENCOUNTER — Encounter: Payer: Self-pay | Admitting: Orthopaedic Surgery

## 2020-11-09 DIAGNOSIS — G8929 Other chronic pain: Secondary | ICD-10-CM

## 2020-11-09 DIAGNOSIS — M1711 Unilateral primary osteoarthritis, right knee: Secondary | ICD-10-CM | POA: Diagnosis not present

## 2020-11-09 DIAGNOSIS — F17210 Nicotine dependence, cigarettes, uncomplicated: Secondary | ICD-10-CM

## 2020-11-09 NOTE — Progress Notes (Signed)
This patient is diagnosed with osteoarthritis of the knee(s).    Radiographs show evidence of joint space narrowing, osteophytes, subchondral sclerosis and/or subchondral cysts.  This patient has knee pain which interferes with functional and activities of daily living.    This patient has experienced inadequate response, adverse effects and/or intolerance with conservative treatments such as acetaminophen, NSAIDS, topical creams, physical therapy or regular exercise, knee bracing and/or weight loss.   This patient has experienced inadequate response or has a contraindication to intra articular steroid injections for at least 3 months.   This patient is not scheduled to have a total knee replacement within 6 months of starting treatment with viscosupplementation.  PROCEDURE NOTE:  Orthovisc injection #  2 of 3   Injection 1 vial of Orthovisc into right  knee  There were no vitals taken for this visit.  The right knee exam: there was no synovitis or infection   The knee was prepped sterilely  Ethyl chloride was used to anesthetize the skin A 20 g needle was used to inject the knee with 1 vial of Orthovisc A sterile dressing was placed  There were no complications  Encounter Diagnoses  Name Primary?  . Chronic pain of right knee Yes  . Nicotine dependence, cigarettes, uncomplicated   . Primary osteoarthritis of right knee     Follow up one week  Electronically Signed Sanjuana Kava, MD 3/8/20222:39 PM

## 2020-11-16 ENCOUNTER — Encounter: Payer: Self-pay | Admitting: Orthopaedic Surgery

## 2020-11-16 ENCOUNTER — Ambulatory Visit (INDEPENDENT_AMBULATORY_CARE_PROVIDER_SITE_OTHER): Payer: Medicare Other | Admitting: Orthopaedic Surgery

## 2020-11-16 ENCOUNTER — Other Ambulatory Visit: Payer: Self-pay

## 2020-11-16 DIAGNOSIS — M1711 Unilateral primary osteoarthritis, right knee: Secondary | ICD-10-CM

## 2020-11-16 DIAGNOSIS — G8929 Other chronic pain: Secondary | ICD-10-CM

## 2020-11-16 DIAGNOSIS — F17210 Nicotine dependence, cigarettes, uncomplicated: Secondary | ICD-10-CM | POA: Diagnosis not present

## 2020-11-16 DIAGNOSIS — M25561 Pain in right knee: Secondary | ICD-10-CM

## 2020-11-16 NOTE — Patient Instructions (Signed)
Steps to Quit Smoking Smoking tobacco is the leading cause of preventable death. It can affect almost every organ in the body. Smoking puts you and people around you at risk for many serious, long-lasting (chronic) diseases. Quitting smoking can be hard, but it is one of the best things that you can do for your health. It is never too late to quit. How do I get ready to quit? When you decide to quit smoking, make a plan to help you succeed. Before you quit:  Pick a date to quit. Set a date within the next 2 weeks to give you time to prepare.  Write down the reasons why you are quitting. Keep this list in places where you will see it often.  Tell your family, friends, and co-workers that you are quitting. Their support is important.  Talk with your doctor about the choices that may help you quit.  Find out if your health insurance will pay for these treatments.  Know the people, places, things, and activities that make you want to smoke (triggers). Avoid them. What first steps can I take to quit smoking?  Throw away all cigarettes at home, at work, and in your car.  Throw away the things that you use when you smoke, such as ashtrays and lighters.  Clean your car. Make sure to empty the ashtray.  Clean your home, including curtains and carpets. What can I do to help me quit smoking? Talk with your doctor about taking medicines and seeing a counselor at the same time. You are more likely to succeed when you do both.  If you are pregnant or breastfeeding, talk with your doctor about counseling or other ways to quit smoking. Do not take medicine to help you quit smoking unless your doctor tells you to do so. To quit smoking: Quit right away  Quit smoking totally, instead of slowly cutting back on how much you smoke over a period of time.  Go to counseling. You are more likely to quit if you go to counseling sessions regularly. Take medicine You may take medicines to help you quit. Some  medicines need a prescription, and some you can buy over-the-counter. Some medicines may contain a drug called nicotine to replace the nicotine in cigarettes. Medicines may:  Help you to stop having the desire to smoke (cravings).  Help to stop the problems that come when you stop smoking (withdrawal symptoms). Your doctor may ask you to use:  Nicotine patches, gum, or lozenges.  Nicotine inhalers or sprays.  Non-nicotine medicine that is taken by mouth. Find resources Find resources and other ways to help you quit smoking and remain smoke-free after you quit. These resources are most helpful when you use them often. They include:  Online chats with a counselor.  Phone quitlines.  Printed self-help materials.  Support groups or group counseling.  Text messaging programs.  Mobile phone apps. Use apps on your mobile phone or tablet that can help you stick to your quit plan. There are many free apps for mobile phones and tablets as well as websites. Examples include Quit Guide from the CDC and smokefree.gov   What things can I do to make it easier to quit?  Talk to your family and friends. Ask them to support and encourage you.  Call a phone quitline (1-800-QUIT-NOW), reach out to support groups, or work with a counselor.  Ask people who smoke to not smoke around you.  Avoid places that make you want to smoke,   such as: ? Bars. ? Parties. ? Smoke-break areas at work.  Spend time with people who do not smoke.  Lower the stress in your life. Stress can make you want to smoke. Try these things to help your stress: ? Getting regular exercise. ? Doing deep-breathing exercises. ? Doing yoga. ? Meditating. ? Doing a body scan. To do this, close your eyes, focus on one area of your body at a time from head to toe. Notice which parts of your body are tense. Try to relax the muscles in those areas.   How will I feel when I quit smoking? Day 1 to 3 weeks Within the first 24 hours,  you may start to have some problems that come from quitting tobacco. These problems are very bad 2-3 days after you quit, but they do not often last for more than 2-3 weeks. You may get these symptoms:  Mood swings.  Feeling restless, nervous, angry, or annoyed.  Trouble concentrating.  Dizziness.  Strong desire for high-sugar foods and nicotine.  Weight gain.  Trouble pooping (constipation).  Feeling like you may vomit (nausea).  Coughing or a sore throat.  Changes in how the medicines that you take for other issues work in your body.  Depression.  Trouble sleeping (insomnia). Week 3 and afterward After the first 2-3 weeks of quitting, you may start to notice more positive results, such as:  Better sense of smell and taste.  Less coughing and sore throat.  Slower heart rate.  Lower blood pressure.  Clearer skin.  Better breathing.  Fewer sick days. Quitting smoking can be hard. Do not give up if you fail the first time. Some people need to try a few times before they succeed. Do your best to stick to your quit plan, and talk with your doctor if you have any questions or concerns. Summary  Smoking tobacco is the leading cause of preventable death. Quitting smoking can be hard, but it is one of the best things that you can do for your health.  When you decide to quit smoking, make a plan to help you succeed.  Quit smoking right away, not slowly over a period of time.  When you start quitting, seek help from your doctor, family, or friends. This information is not intended to replace advice given to you by your health care provider. Make sure you discuss any questions you have with your health care provider. Document Revised: 05/16/2019 Document Reviewed: 11/09/2018 Elsevier Patient Education  2021 Elsevier Inc.  

## 2020-11-16 NOTE — Progress Notes (Signed)
PROCEDURE NOTE:  Orthovisc injection #  3 of 3   Injection 1 vial of Orthovisc into right  knee  There were no vitals taken for this visit.  The right knee exam: there was no synovitis or infection   The knee was prepped sterilely  Ethyl chloride was used to anesthetize the skin A 20 g needle was used to inject the knee with 1 vial of Orthovisc A sterile dressing was placed  There were no complications  Encounter Diagnoses  Name Primary?  . Chronic pain of right knee Yes  . Primary osteoarthritis of right knee   . Nicotine dependence, cigarettes, uncomplicated     Follow up one month  Electronically Signed Sanjuana Kava, MD 3/15/20222:50 PM

## 2020-12-11 ENCOUNTER — Emergency Department (HOSPITAL_COMMUNITY): Payer: Medicare Other

## 2020-12-11 ENCOUNTER — Other Ambulatory Visit: Payer: Self-pay

## 2020-12-11 ENCOUNTER — Emergency Department (HOSPITAL_COMMUNITY)
Admission: EM | Admit: 2020-12-11 | Discharge: 2020-12-14 | Disposition: A | Discharge status: Home / Self Care | Payer: Medicare Other | Attending: Emergency Medicine | Admitting: Emergency Medicine

## 2020-12-11 ENCOUNTER — Encounter (HOSPITAL_COMMUNITY): Payer: Self-pay | Admitting: Emergency Medicine

## 2020-12-11 DIAGNOSIS — S60222A Contusion of left hand, initial encounter: Secondary | ICD-10-CM | POA: Insufficient documentation

## 2020-12-11 DIAGNOSIS — R000 Tachycardia, unspecified: Secondary | ICD-10-CM | POA: Insufficient documentation

## 2020-12-11 DIAGNOSIS — S8012XA Contusion of left lower leg, initial encounter: Secondary | ICD-10-CM | POA: Insufficient documentation

## 2020-12-11 DIAGNOSIS — S8011XA Contusion of right lower leg, initial encounter: Secondary | ICD-10-CM | POA: Insufficient documentation

## 2020-12-11 DIAGNOSIS — F17210 Nicotine dependence, cigarettes, uncomplicated: Secondary | ICD-10-CM | POA: Diagnosis not present

## 2020-12-11 DIAGNOSIS — R062 Wheezing: Secondary | ICD-10-CM | POA: Diagnosis not present

## 2020-12-11 DIAGNOSIS — Z7951 Long term (current) use of inhaled steroids: Secondary | ICD-10-CM | POA: Diagnosis not present

## 2020-12-11 DIAGNOSIS — J449 Chronic obstructive pulmonary disease, unspecified: Secondary | ICD-10-CM | POA: Diagnosis not present

## 2020-12-11 DIAGNOSIS — W1839XA Other fall on same level, initial encounter: Secondary | ICD-10-CM | POA: Insufficient documentation

## 2020-12-11 DIAGNOSIS — S5012XA Contusion of left forearm, initial encounter: Secondary | ICD-10-CM | POA: Insufficient documentation

## 2020-12-11 DIAGNOSIS — Z91040 Latex allergy status: Secondary | ICD-10-CM | POA: Insufficient documentation

## 2020-12-11 DIAGNOSIS — R262 Difficulty in walking, not elsewhere classified: Secondary | ICD-10-CM

## 2020-12-11 DIAGNOSIS — S5011XA Contusion of right forearm, initial encounter: Secondary | ICD-10-CM | POA: Diagnosis not present

## 2020-12-11 DIAGNOSIS — Z23 Encounter for immunization: Secondary | ICD-10-CM | POA: Insufficient documentation

## 2020-12-11 DIAGNOSIS — S6991XA Unspecified injury of right wrist, hand and finger(s), initial encounter: Secondary | ICD-10-CM | POA: Diagnosis present

## 2020-12-11 DIAGNOSIS — S60221A Contusion of right hand, initial encounter: Secondary | ICD-10-CM | POA: Insufficient documentation

## 2020-12-11 DIAGNOSIS — Z20822 Contact with and (suspected) exposure to covid-19: Secondary | ICD-10-CM | POA: Insufficient documentation

## 2020-12-11 DIAGNOSIS — S0083XA Contusion of other part of head, initial encounter: Secondary | ICD-10-CM | POA: Insufficient documentation

## 2020-12-11 DIAGNOSIS — S61411A Laceration without foreign body of right hand, initial encounter: Secondary | ICD-10-CM | POA: Insufficient documentation

## 2020-12-11 DIAGNOSIS — Z7982 Long term (current) use of aspirin: Secondary | ICD-10-CM | POA: Insufficient documentation

## 2020-12-11 DIAGNOSIS — W19XXXA Unspecified fall, initial encounter: Secondary | ICD-10-CM

## 2020-12-11 DIAGNOSIS — M25551 Pain in right hip: Secondary | ICD-10-CM | POA: Diagnosis not present

## 2020-12-11 LAB — RESP PANEL BY RT-PCR (FLU A&B, COVID) ARPGX2
Influenza A by PCR: NEGATIVE
Influenza B by PCR: NEGATIVE
SARS Coronavirus 2 by RT PCR: NEGATIVE

## 2020-12-11 LAB — URINALYSIS, ROUTINE W REFLEX MICROSCOPIC
Bacteria, UA: NONE SEEN
Bilirubin Urine: NEGATIVE
Glucose, UA: NEGATIVE mg/dL
Ketones, ur: 80 mg/dL — AB
Leukocytes,Ua: NEGATIVE
Nitrite: NEGATIVE
Protein, ur: 30 mg/dL — AB
Specific Gravity, Urine: 1.019 (ref 1.005–1.030)
pH: 5 (ref 5.0–8.0)

## 2020-12-11 LAB — CBC WITH DIFFERENTIAL/PLATELET
Abs Immature Granulocytes: 0.04 10*3/uL (ref 0.00–0.07)
Basophils Absolute: 0 10*3/uL (ref 0.0–0.1)
Basophils Relative: 1 %
Eosinophils Absolute: 0.1 10*3/uL (ref 0.0–0.5)
Eosinophils Relative: 1 %
HCT: 46 % (ref 36.0–46.0)
Hemoglobin: 13.9 g/dL (ref 12.0–15.0)
Immature Granulocytes: 1 %
Lymphocytes Relative: 10 %
Lymphs Abs: 0.8 10*3/uL (ref 0.7–4.0)
MCH: 30.3 pg (ref 26.0–34.0)
MCHC: 30.2 g/dL (ref 30.0–36.0)
MCV: 100.2 fL — ABNORMAL HIGH (ref 80.0–100.0)
Monocytes Absolute: 0.6 10*3/uL (ref 0.1–1.0)
Monocytes Relative: 8 %
Neutro Abs: 6.4 10*3/uL (ref 1.7–7.7)
Neutrophils Relative %: 79 %
Platelets: 203 10*3/uL (ref 150–400)
RBC: 4.59 MIL/uL (ref 3.87–5.11)
RDW: 16.9 % — ABNORMAL HIGH (ref 11.5–15.5)
WBC: 7.9 10*3/uL (ref 4.0–10.5)
nRBC: 0 % (ref 0.0–0.2)

## 2020-12-11 LAB — CK: Total CK: 329 U/L — ABNORMAL HIGH (ref 38–234)

## 2020-12-11 LAB — BASIC METABOLIC PANEL
Anion gap: 13 (ref 5–15)
BUN: 17 mg/dL (ref 8–23)
CO2: 21 mmol/L — ABNORMAL LOW (ref 22–32)
Calcium: 8.7 mg/dL — ABNORMAL LOW (ref 8.9–10.3)
Chloride: 106 mmol/L (ref 98–111)
Creatinine, Ser: 0.73 mg/dL (ref 0.44–1.00)
GFR, Estimated: >60 mL/min (ref >60)
Glucose, Bld: 88 mg/dL (ref 70–99)
Potassium: 3.4 mmol/L — ABNORMAL LOW (ref 3.5–5.1)
Sodium: 140 mmol/L (ref 135–145)

## 2020-12-11 LAB — TYPE AND SCREEN
ABO/RH(D): O POS
Antibody Screen: NEGATIVE

## 2020-12-11 LAB — PROTIME-INR
INR: 1.1 (ref 0.8–1.2)
Prothrombin Time: 13.3 seconds (ref 11.4–15.2)

## 2020-12-11 MED ORDER — BUPROPION HCL ER (XL) 150 MG PO TB24
300.0000 mg | ORAL_TABLET | Freq: Every day | ORAL | Status: DC
  Administered 2020-12-11 – 2020-12-13 (×3): 300 mg via ORAL
  Filled 2020-12-11 (×3): qty 2

## 2020-12-11 MED ORDER — GABAPENTIN 300 MG PO CAPS
ORAL_CAPSULE | ORAL | Status: AC
  Filled 2020-12-11: qty 2

## 2020-12-11 MED ORDER — HYPROMELLOSE (GONIOSCOPIC) 2.5 % OP SOLN
1.0000 [drp] | Freq: Every day | OPHTHALMIC | Status: DC | PRN
  Filled 2020-12-11: qty 15

## 2020-12-11 MED ORDER — ZOLPIDEM TARTRATE 5 MG PO TABS
5.0000 mg | ORAL_TABLET | Freq: Every evening | ORAL | Status: DC | PRN
  Administered 2020-12-11 – 2020-12-13 (×2): 5 mg via ORAL
  Filled 2020-12-11 (×2): qty 1

## 2020-12-11 MED ORDER — IPRATROPIUM-ALBUTEROL 0.5-2.5 (3) MG/3ML IN SOLN
3.0000 mL | Freq: Three times a day (TID) | RESPIRATORY_TRACT | Status: DC
  Administered 2020-12-11 – 2020-12-12 (×4): 3 mL via RESPIRATORY_TRACT
  Filled 2020-12-11 (×4): qty 3

## 2020-12-11 MED ORDER — ASPIRIN EC 325 MG PO TBEC
325.0000 mg | DELAYED_RELEASE_TABLET | Freq: Every day | ORAL | Status: DC
  Administered 2020-12-12 – 2020-12-14 (×3): 325 mg via ORAL
  Filled 2020-12-11 (×3): qty 1

## 2020-12-11 MED ORDER — TETANUS-DIPHTH-ACELL PERTUSSIS 5-2.5-18.5 LF-MCG/0.5 IM SUSY
0.5000 mL | PREFILLED_SYRINGE | Freq: Once | INTRAMUSCULAR | Status: AC
  Administered 2020-12-11: 0.5 mL via INTRAMUSCULAR
  Filled 2020-12-11: qty 0.5

## 2020-12-11 MED ORDER — HYDROCODONE BITARTRATE ER 30 MG PO T24A: 30.0000 mg | EXTENDED_RELEASE_TABLET | Freq: Every day | ORAL | Status: DC

## 2020-12-11 MED ORDER — ALBUTEROL SULFATE (2.5 MG/3ML) 0.083% IN NEBU: 2.5000 mg | INHALATION_SOLUTION | Freq: Four times a day (QID) | RESPIRATORY_TRACT | Status: DC | PRN

## 2020-12-11 MED ORDER — ONDANSETRON HCL 4 MG/2ML IJ SOLN
4.0000 mg | Freq: Once | INTRAMUSCULAR | Status: AC
Start: 2020-12-11 — End: 2020-12-11
  Administered 2020-12-11: 4 mg via INTRAVENOUS
  Filled 2020-12-11: qty 2

## 2020-12-11 MED ORDER — FENTANYL CITRATE (PF) 100 MCG/2ML IJ SOLN
50.0000 ug | INTRAMUSCULAR | Status: DC | PRN
  Administered 2020-12-11: 50 ug via INTRAVENOUS
  Filled 2020-12-11: qty 2

## 2020-12-11 MED ORDER — FUROSEMIDE 40 MG PO TABS: 20.0000 mg | ORAL_TABLET | Freq: Every day | ORAL | Status: DC | PRN

## 2020-12-11 MED ORDER — ALBUTEROL SULFATE HFA 108 (90 BASE) MCG/ACT IN AERS: 2.0000 | INHALATION_SPRAY | Freq: Four times a day (QID) | RESPIRATORY_TRACT | Status: DC | PRN

## 2020-12-11 MED ORDER — HYDROCODONE-ACETAMINOPHEN 10-325 MG PO TABS: 1.0000 | ORAL_TABLET | Freq: Three times a day (TID) | ORAL | Status: DC | PRN

## 2020-12-11 MED ORDER — OMEGA-3-ACID ETHYL ESTERS 1 G PO CAPS
1.0000 g | ORAL_CAPSULE | Freq: Every day | ORAL | Status: DC
  Administered 2020-12-11 – 2020-12-14 (×4): 1 g via ORAL
  Filled 2020-12-11 (×4): qty 1

## 2020-12-11 MED ORDER — FLUTICASONE FUROATE-VILANTEROL 200-25 MCG/INH IN AEPB
1.0000 | INHALATION_SPRAY | Freq: Every day | RESPIRATORY_TRACT | Status: DC
  Administered 2020-12-12 – 2020-12-13 (×2): 1 via RESPIRATORY_TRACT
  Filled 2020-12-11: qty 28

## 2020-12-11 MED ORDER — GABAPENTIN 600 MG PO TABS
600.0000 mg | ORAL_TABLET | Freq: Three times a day (TID) | ORAL | Status: DC
  Administered 2020-12-11: 600 mg via ORAL
  Filled 2020-12-11 (×5): qty 1

## 2020-12-11 MED ORDER — MORPHINE SULFATE ER 15 MG PO TBCR
15.0000 mg | EXTENDED_RELEASE_TABLET | Freq: Two times a day (BID) | ORAL | Status: DC
  Administered 2020-12-11 – 2020-12-13 (×5): 15 mg via ORAL
  Filled 2020-12-11 (×5): qty 1

## 2020-12-11 NOTE — ED Provider Notes (Signed)
[facility] EMERGENCY DEPARTMENT Provider Note   CSN: [id number] Arrival date & time: 12/11/20  1354     History Chief Complaint  Patient presents with  . [place] two days ago.  Not on any blood thinners.  Injury noted to right face (eye) and skin tear to right hand (quarter size).  C/o back and hip.      Meagan Holt is a 80 y.o. female who presents for fall. Hx is given by the patient and EMS. There is discrepancy in the date of the injury as EMS reports a neighbor who checks on her says that she was up and about yesterday. The patient states that she fell 2 days ago and has not been able to get off of the floor. She has frequent falls due to a "bad back." She states that her leg "gave out" and she fell. She did hit her head but did not lose consciousness. She c/o severe R hip pain. She has acute on chronic low back pain.  She denies any numbness or paresthesia.  HPI     Past Medical History:  Diagnosis Date  . Arthritis    "all my joints" (07/30/2013)  . Chronic back pain   . Chronic bronchitis ([place])    "get it q year" (07/30/2013)  . COPD (chronic obstructive pulmonary disease) ([place])   . Depression   . Falls frequently    "fell twice in the last 2 days" (07/30/2013)  . GERD (gastroesophageal reflux disease)    "rarely" (07/30/2013)  . H/O hiatal hernia   . History of blood transfusion   . Kidney stones   . Stroke [facility])     Patient Active Problem List   Diagnosis Date Noted  . Other hyperlipidemia 06/27/2020  . Recurrent major depressive disorder, in partial remission ([place]) 06/27/2020  . Acute encephalopathy 04/26/2020  . AMS (altered mental status) 04/25/2020  . 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 Severe sepsis due to bilateral pneumonia with acute on chronic hypoxic respiratory failure 04/18/2020  . CAP (community acquired pneumonia) 04/14/2020  . Acute on chronic respiratory failure with hypoxia ([place]) 04/14/2020  . Recurrent falls 04/14/2020  . Atrial  fibrillation with RVR ([place]) 04/14/2020  . Leukocytosis 04/14/2020  . Obesity (BMI 30.0-34.9) 04/14/2020  . Benign paroxysmal positional vertigo 09/08/2018  . Cellulitis of right lower leg 09/08/2018  . Orthostatic hypotension 05/22/2017  . Syncope and collapse 05/21/2017  . Chronic pain syndrome 05/21/2017  . Altered mental status 05/20/2017  . GERD (gastroesophageal reflux disease) 05/20/2017  . Depression 05/20/2017  . History of stroke 05/20/2017  . Premature beats 05/20/2017  . Cervical spondylosis without myelopathy 05/15/2017  . Lumbar spondylosis 05/15/2017  . 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 Decreased range of motion of wrist 11/28/2016  . Anxiety 05/10/2016  . Head injury 07/30/2013  . Difficulty walking 07/30/2013  . COPD (chronic obstructive pulmonary disease) ([place]) 07/30/2013    Past Surgical History:  Procedure Laterality Date  . APPENDECTOMY    . BACK SURGERY     "put cages in mid back; did arthroscopic OR on lower back" (11/326/2014)  . BILATERAL OOPHORECTOMY Bilateral   . CATARACT EXTRACTION W/ INTRAOCULAR LENS  IMPLANT, BILATERAL Bilateral   . CHOLECYSTECTOMY    . EXCISIONAL HEMORRHOIDECTOMY    . GASTRIC BYPASS    . HERNIA REPAIR     "removed my belly button too" (07/30/2013)  . KIDNEY STONE SURGERY     "  twice" (07/30/2013)  . KNEE ARTHROSCOPY Left   . TONSILLECTOMY       OB History   No obstetric history on file.     Family History  Problem Relation Age of Onset  . Stomach cancer Father     Social History   Tobacco Use  . Smoking status: Current Every Day Smoker    Packs/day: 1.00    Years: 42.00    Pack years: 42.00    Types: Cigarettes  . Smokeless tobacco: Never Used  Substance Use Topics  . Alcohol use: Yes    Comment: rarely   . Drug use: No    Home Medications Prior to Admission medications   Medication Sig Start Date End Date Taking? Authorizing Provider  ADVAIR DISKUS 250-50 MCG/DOSE  AEPB Inhale 1 puff into the lungs 2 (two) times daily. 04/19/20  Yes Emokpae, Courage, MD  albuterol (VENTOLIN HFA) 108 (90 Base) MCG/ACT inhaler Inhale 2 puffs into the lungs every 6 (six) hours as needed for wheezing or shortness of breath. 04/19/20  Yes Roxan Hockey, MD  aspirin EC 325 MG tablet Take 1 tablet (325 mg total) by mouth daily with breakfast. 04/19/20  Yes Emokpae, Courage, MD  buPROPion (WELLBUTRIN XL) 300 MG 24 hr tablet Take 300 mg by mouth at bedtime.    Yes [provider]  furosemide (LASIX) 20 MG tablet Take 20 mg by mouth daily as needed for fluid or edema.   Yes [provider]  gabapentin (NEURONTIN) 600 MG tablet Take 600 mg by mouth 3 (three) times daily. 03/29/20  Yes [provider]  HYDROcodone Bitartrate ER 30 MG T24A Take 30 mg by mouth daily. 08/27/20  Yes [provider]  HYDROcodone-acetaminophen (NORCO) 10-325 MG tablet Take 1 tablet by mouth 3 (three) times daily as needed. 04/27/20  Yes Kathie Dike, MD  ipratropium-albuterol (DUONEB) 0.5-2.5 (3) MG/3ML SOLN Take 3 mLs by nebulization 3 (three) times daily. 04/27/20  Yes Kathie Dike, MD  Multiple Vitamin (MULTIVITAMIN WITH MINERALS) TABS tablet Take 1 tablet by mouth daily.   Yes [provider]  Multiple Vitamins-Minerals (ICAPS PO) Take 1 tablet by mouth daily.   Yes [provider]  Omega-3 Fatty Acids (FISH OIL) 1000 MG CAPS Take 3 capsules by mouth daily.   Yes [provider]  Polyethyl Glycol-Propyl Glycol 0.4-0.3 % SOLN Apply 1-2 drops to eye daily as needed (for dry eye relief).   Yes [provider]  zolpidem (AMBIEN) 5 MG tablet Take 1 tablet (5 mg total) by mouth at bedtime as needed for sleep. 04/27/20  Yes Kathie Dike, MD    Allergies    Latex and Doxycycline  Review of Systems   Review of Systems Ten systems reviewed and are negative for acute change, except as noted in the HPI.   Physical Exam Updated Vital  Signs BP (!) 118/51   Pulse 92   Temp 99 F (37.2 C)   Resp 20   Ht 5\' 6"  (1.676 m)   Wt 89.8 kg   SpO2 94%   BMI 31.95 kg/m   Physical Exam Vitals and nursing note reviewed.  Constitutional:      General: She is not in acute distress.    Appearance: She is well-developed. She is not diaphoretic.     Comments: Mickey Farber is soaked in dried urine   HENT:     Head: Normocephalic.     Comments: Large hematoma around the R eye and temple    Nose:  Nose normal.     Mouth/Throat:     Mouth: Mucous membranes are dry.  Eyes:     General: No scleral icterus.    Extraocular Movements: Extraocular movements intact.     Conjunctiva/sclera: Conjunctivae normal.     Pupils: Pupils are equal, round, and reactive to light.  Cardiovascular:     Rate and Rhythm: Regular rhythm. Tachycardia present.     Pulses: Normal pulses.     Heart sounds: Normal heart sounds. No murmur heard. No friction rub. No gallop.   Pulmonary:     Effort: Pulmonary effort is normal. No respiratory distress.     Breath sounds: Wheezing and rhonchi present.  Abdominal:     General: Bowel sounds are normal. There is no distension.     Palpations: Abdomen is soft. There is no mass.     Tenderness: There is no abdominal tenderness. There is no guarding.  Musculoskeletal:     Comments: FROM upper extremities without pain FROM Left hip Unable to move R hip due to pain with bony tenderness over the greater trochanter. No obvious deformity  Skin:    General: Skin is warm and dry.     Comments: Skin tear R hand Bruises over hands and forearms Bruises over legs and petechiae on R foot  Neurological:     Mental Status: She is alert and oriented to person, place, and time.  Psychiatric:        Behavior: Behavior normal.     ED Results / Procedures / Treatments   Labs (all labs ordered are listed, but only abnormal results are displayed) Labs Reviewed  BASIC METABOLIC PANEL - Abnormal; Notable for the following  components:      Result Value   Potassium 3.4 (*)    CO2 21 (*)    Calcium 8.7 (*)    All other components within normal limits  CBC WITH DIFFERENTIAL/PLATELET - Abnormal; Notable for the following components:   MCV 100.2 (*)    RDW 16.9 (*)    All other components within normal limits  URINALYSIS, ROUTINE W REFLEX MICROSCOPIC - Abnormal; Notable for the following components:   Hgb urine dipstick SMALL (*)    Ketones, ur 80 (*)    Protein, ur 30 (*)    All other components within normal limits  CK - Abnormal; Notable for the following components:   Total CK 329 (*)    All other components within normal limits  RESP PANEL BY RT-PCR (FLU A&B, COVID) [id number]  PROTIME-INR  TYPE AND SCREEN    EKG EKG Interpretation  Date/Time:  Saturday December 11 2020 14:10:43 EDT Ventricular Rate:  101 PR Interval:    QRS Duration: 100 QT Interval:  451 QTC Calculation: 561 R Axis:   104 Text Interpretation: Atrial fibrillation Ventricular premature complex Right axis deviation Anteroseptal infarct, old Borderline repolarization abnormality Prolonged QT interval Confirmed by Octaviano Glow [phone]) on 12/11/2020 3:54:38 PM   Radiology CT HEAD WO CONTRAST  Result Date: 12/11/2020 CLINICAL DATA:  80 year old female with fall today, head injury and neck pain. EXAM: CT HEAD WITHOUT CONTRAST CT CERVICAL SPINE WITHOUT CONTRAST TECHNIQUE: Multidetector CT imaging of the head and cervical spine was performed following the standard protocol without intravenous contrast. Multiplanar CT image reconstructions of the cervical spine were also generated. COMPARISON:  04/25/2020 CTs. FINDINGS: CT HEAD FINDINGS Brain: No evidence of acute infarction, hemorrhage, hydrocephalus, extra-axial collection or mass lesion/mass effect. Atrophy, mild chronic small-vessel white matter ischemic changes and remote  basal ganglia infarcts again noted. Vascular: Carotid atherosclerotic calcifications are noted. Skull: Normal. Negative  for fracture or focal lesion. Sinuses/Orbits: No acute finding. Other: None. CT CERVICAL SPINE FINDINGS Alignment: Normal. Skull base and vertebrae: No acute fracture. No primary bone lesion or focal pathologic process. Soft tissues and spinal canal: No prevertebral fluid or swelling. No visible canal hematoma. Disc levels: Moderate multilevel degenerative disc disease, spondylosis and facet arthropathy again noted. Upper chest: No acute abnormality Other: None IMPRESSION: 1. No evidence of acute intracranial abnormality. Atrophy, chronic small-vessel white matter ischemic changes and remote basal ganglia infarcts. 2. No static evidence of acute injury to the cervical spine. Electronically Signed   By: Margarette Canada M.D.   On: 12/11/2020 15:45   CT CERVICAL SPINE WO CONTRAST  Result Date: 12/11/2020 CLINICAL DATA:  80 year old female with fall today, head injury and neck pain. EXAM: CT HEAD WITHOUT CONTRAST CT CERVICAL SPINE WITHOUT CONTRAST TECHNIQUE: Multidetector CT imaging of the head and cervical spine was performed following the standard protocol without intravenous contrast. Multiplanar CT image reconstructions of the cervical spine were also generated. COMPARISON:  04/25/2020 CTs. FINDINGS: CT HEAD FINDINGS Brain: No evidence of acute infarction, hemorrhage, hydrocephalus, extra-axial collection or mass lesion/mass effect. Atrophy, mild chronic small-vessel white matter ischemic changes and remote basal ganglia infarcts again noted. Vascular: Carotid atherosclerotic calcifications are noted. Skull: Normal. Negative for fracture or focal lesion. Sinuses/Orbits: No acute finding. Other: None. CT CERVICAL SPINE FINDINGS Alignment: Normal. Skull base and vertebrae: No acute fracture. No primary bone lesion or focal pathologic process. Soft tissues and spinal canal: No prevertebral fluid or swelling. No visible canal hematoma. Disc levels: Moderate multilevel degenerative disc disease, spondylosis and facet  arthropathy again noted. Upper chest: No acute abnormality Other: None IMPRESSION: 1. No evidence of acute intracranial abnormality. Atrophy, chronic small-vessel white matter ischemic changes and remote basal ganglia infarcts. 2. No static evidence of acute injury to the cervical spine. Electronically Signed   By: Margarette Canada M.D.   On: 12/11/2020 15:45   CT Hip Right Wo Contrast  Result Date: 12/11/2020 CLINICAL DATA:  Right hip pain after fall. EXAM: CT OF THE RIGHT HIP WITHOUT CONTRAST TECHNIQUE: Multidetector CT imaging of the right hip was performed according to the standard protocol. Multiplanar CT image reconstructions were also generated. COMPARISON:  Right hip x-rays from same day. FINDINGS: Bones/Joint/Cartilage No fracture or dislocation. Mild right hip osteoarthritis with chondrocalcinosis. No joint effusion. Ligaments Ligaments are suboptimally evaluated by CT. Muscles and Tendons Grossly intact.  Right gluteus minimus and medius muscle atrophy. Soft tissue No fluid collection or hematoma.  No soft tissue mass. IMPRESSION: 1. No acute osseous abnormality. 2. Mild right hip osteoarthritis. Electronically Signed   By: Titus Dubin M.D.   On: 12/11/2020 16:30   DG Chest Port 1 View  Result Date: 12/11/2020 CLINICAL DATA:  Pain following fall EXAM: PORTABLE CHEST 1 VIEW COMPARISON:  April 25, 2020 FINDINGS: There is stable scarring in the lateral right base. Elsewhere there are areas of interstitial thickening, stable. Heart is upper normal in size with pulmonary vascularity normal. No adenopathy. No pneumothorax. There is degenerative change in each shoulder. IMPRESSION: Stable scarring lateral right base. Interstitial thickening is present and stable, likely representing a degree of chronic bronchitis. No edema or airspace opacity. Heart upper normal in size. Electronically Signed   By: Lowella Grip III M.D.   On: 12/11/2020 15:05   DG Hip Unilat With Pelvis 2-3 Views Right  Result  Date: 12/11/2020 CLINICAL DATA:  Right hip pain after fall. EXAM: DG HIP (WITH OR WITHOUT PELVIS) 2-3V RIGHT COMPARISON:  Right hip x-rays dated April 14, 2020. FINDINGS: No acute fracture or dislocation. Unchanged mild bilateral hip osteoarthritis. Prior lumbosacral fusion. Bowel anastomotic sutures in the lower abdomen. IMPRESSION: 1. No acute osseous abnormality. Electronically Signed   By: Titus Dubin M.D.   On: 12/11/2020 15:47    Procedures Procedures   Medications Ordered in ED Medications  fentaNYL (SUBLIMAZE) injection 50 mcg (50 mcg Intravenous Given 12/11/20 1451)  fluticasone furoate-vilanterol (BREO ELLIPTA) 200-25 MCG/INH 1 puff (1 puff Inhalation Not Given 12/11/20 2152)  aspirin EC tablet 325 mg (has no administration in time range)  buPROPion (WELLBUTRIN XL) 24 hr tablet 300 mg (300 mg Oral Given 12/11/20 2302)  furosemide (LASIX) tablet 20 mg (has no administration in time range)  gabapentin (NEURONTIN) tablet 600 mg (600 mg Oral Given 12/11/20 2303)  HYDROcodone-acetaminophen (NORCO) 10-325 MG per tablet 1 tablet (has no administration in time range)  ipratropium-albuterol (DUONEB) 0.5-2.5 (3) MG/3ML nebulizer solution 3 mL (3 mLs Nebulization Given 12/11/20 2152)  omega-3 acid ethyl esters (LOVAZA) capsule 1 g (1 g Oral Given 12/11/20 2303)  hydroxypropyl methylcellulose / hypromellose (ISOPTO TEARS / GONIOVISC) 2.5 % ophthalmic solution 1-2 drop (has no administration in time range)  zolpidem (AMBIEN) tablet 5 mg (5 mg Oral Given 12/11/20 2303)  albuterol (PROVENTIL) (2.5 MG/3ML) 0.083% nebulizer solution 2.5 mg (has no administration in time range)  morphine (MS CONTIN) 12 hr tablet 15 mg (15 mg Oral Given 12/11/20 2303)  ondansetron (ZOFRAN) injection 4 mg (4 mg Intravenous Given 12/11/20 1442)  Tdap (BOOSTRIX) injection 0.5 mL (0.5 mLs Intramuscular Given 12/11/20 1454)    ED Course  I have reviewed the triage vital signs and the nursing notes.  Pertinent labs & imaging results  that were available during my care of the patient were reviewed by me and considered in my medical decision making (see chart for details).  Clinical Course as of 12/11/20 2322  Sat Dec 11, 2020  1629 This is an 80 year old female with a history of A. fib, not on anticoagulation, presenting from home with mechanical fall.  She reports she fell 1 or 2 days ago, spent multiple hours on the ground unable to get up.  She lives by herself and says she has friends check in on her.  She normally walks with a walker.  She did strike her head.  On exam she has a right-sided periorbital ecchymoses.  She has superficial abrasions to the bilateral forearms, where she states she was crawling on the ground.  No significant bony tenderness here.  She also has right hip pain with active or passive flexion at the hip.  Her right leg is everted.  She is neurovascularly intact.  CT scan of the head and C-spine did not show any acute traumatic injuries.  X-ray of the hip did not show any obvious fracture.  However given her level of pain, I think a CT scan to evaluate for pelvic or femur fracture would be reasonable.  We will reassess afterwards.  [MT]    Clinical Course User Index [MT] Trifan, Carola Rhine, MD   MDM Rules/Calculators/A&P                         80 year old female here with frequent falls.  She presents after mechanical fall.  Complaining of severe pain in the hip and inability to  ambulate.  She states that she was on the ground for about 2 days.  I ordered and reviewed labs which include CBC which shows a macrocytosis without anemia, BMP with mild hypokalemia of insignificant value.  Urine shows no evidence of infection, PT/INR within normal limits, respiratory panel is negative for flu or Covid.  I ordered and reviewed chest and hip x-ray both of which are within normal limits.  I ordered and reviewed CT C-spine, head and hip which show no acute abnormalities.  EKG shows A. fib at a rate of 101.  Patient  unable to ambulate even with pain control.  I have placed a PT evaluation for SNF placement.  Home meds ordered.  She is stable throughout the ED visit. . Final Clinical Impression(s) / ED Diagnoses Final diagnoses:  None    Rx / DC Orders ED Discharge Orders    None       Margarita Mail, PA-C 12/11/20 2324    Milton Ferguson, MD 12/12/20 [phone]

## 2020-12-11 NOTE — ED Triage Notes (Signed)
Neighbor checks on pt daily and pt was fine yesterday but found on floor today.

## 2020-12-12 DIAGNOSIS — S60221A Contusion of right hand, initial encounter: Secondary | ICD-10-CM | POA: Diagnosis not present

## 2020-12-12 MED ORDER — IPRATROPIUM-ALBUTEROL 0.5-2.5 (3) MG/3ML IN SOLN
3.0000 mL | Freq: Three times a day (TID) | RESPIRATORY_TRACT | Status: DC
  Administered 2020-12-13 – 2020-12-14 (×5): 3 mL via RESPIRATORY_TRACT
  Filled 2020-12-12 (×5): qty 3

## 2020-12-12 MED ORDER — GABAPENTIN 300 MG PO CAPS
600.0000 mg | ORAL_CAPSULE | Freq: Three times a day (TID) | ORAL | Status: DC
  Administered 2020-12-12 – 2020-12-14 (×6): 600 mg via ORAL
  Filled 2020-12-12 (×6): qty 2

## 2020-12-12 MED ORDER — POLYVINYL ALCOHOL 1.4 % OP SOLN: 1.0000 [drp] | OPHTHALMIC | Status: DC | PRN

## 2020-12-12 NOTE — NC FL2 (Signed)
[place] LEVEL OF CARE SCREENING TOOL     IDENTIFICATION  Patient Name: Meagan Holt Birthdate: 1940/09/23 Sex: female Admission Date (Current Location): 12/11/2020  [facility] and [place] Number:  [employer] and Address:  [place] [address] [place]      Provider Number: [id number]  Attending Physician Name and Address:  Default, Provider, MD  Relative Name and Phone Number:  Marlin Canary     [phone]    Current Level of Care: SNF Recommended Level of Care: [place] Prior Approval Number:    Date Approved/Denied: 05/05/07 PASRR Number: [id number]  Discharge Plan: SNF    Current Diagnoses: Patient Active Problem List   Diagnosis Date Noted  . Other hyperlipidemia 06/27/2020  . Recurrent major depressive disorder, in partial remission ([place]) 06/27/2020  . Acute encephalopathy 04/26/2020  . AMS (altered mental status) 04/25/2020  . 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 Severe sepsis due to bilateral pneumonia with acute on chronic hypoxic respiratory failure 04/18/2020  . CAP (community acquired pneumonia) 04/14/2020  . Acute on chronic respiratory failure with hypoxia ([place]) 04/14/2020  . Recurrent falls 04/14/2020  . Atrial fibrillation with RVR ([place]) 04/14/2020  . Leukocytosis 04/14/2020  . Obesity (BMI 30.0-34.9) 04/14/2020  . Benign paroxysmal positional vertigo 09/08/2018  . Cellulitis of right lower leg 09/08/2018  . Orthostatic hypotension 05/22/2017  . Syncope and collapse 05/21/2017  . Chronic pain syndrome 05/21/2017  . Altered mental status 05/20/2017  . GERD (gastroesophageal reflux disease) 05/20/2017  . Depression 05/20/2017  . History of stroke 05/20/2017  . Premature beats 05/20/2017  . Cervical spondylosis without myelopathy 05/15/2017  . Lumbar spondylosis 05/15/2017  . 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 Decreased range of  motion of wrist 11/28/2016  . Anxiety 05/10/2016  . Head injury 07/30/2013  . Difficulty walking 07/30/2013  . COPD (chronic obstructive pulmonary disease) ([place]) 07/30/2013    Orientation RESPIRATION BLADDER Height & Weight     Self,Time,Situation,Place  Normal External catheter,Incontinent Weight: 197 lb 15.6 oz (89.8 kg) Height:  5\' 6"  (167.6 cm)  BEHAVIORAL SYMPTOMS/MOOD NEUROLOGICAL BOWEL NUTRITION STATUS      Incontinent,Continent Diet (Not yet available)  AMBULATORY STATUS COMMUNICATION OF NEEDS Skin   Extensive Assist Verbally Normal                       Personal Care Assistance Level of Assistance  Bathing,Feeding,Dressing Bathing Assistance: Maximum assistance Feeding assistance: Limited assistance Dressing Assistance: Maximum assistance     Functional Limitations Info  Sight,Hearing,Speech Sight Info: Adequate Hearing Info: Adequate Speech Info: Adequate    SPECIAL CARE FACTORS FREQUENCY  PT (By licensed PT)     PT Frequency: 5x              Contractures Contractures Info: Not present    Additional Factors Info  Code Status,Allergies Code Status Info: Full Allergies Info: Latex, Doxycycline           Current Medications (12/12/2020):  This is the current hospital active medication list Current Facility-Administered Medications  Medication Dose Route Frequency Provider Last Rate Last Admin  . albuterol (PROVENTIL) (2.5 MG/3ML) 0.083% nebulizer solution 2.5 mg  2.5 mg Nebulization Q6H PRN Wyvonnia Dusky, MD      . aspirin EC tablet 325 mg  325 mg Oral Q breakfast Margarita Mail, PA-C   325 mg at 12/12/20 0940  . buPROPion [facility]  XL) 24 hr tablet 300 mg  300 mg Oral QHS Harris, Abigail, PA-C   300 mg at 12/11/20 2302  . fentaNYL (SUBLIMAZE) injection 50 mcg  50 mcg Intravenous Q30 min PRN Margarita Mail, PA-C   50 mcg at 12/11/20 1451  . fluticasone furoate-vilanterol (BREO ELLIPTA) 200-25 MCG/INH 1 puff  1 puff Inhalation Daily  Harris, Abigail, PA-C   1 puff at 12/12/20 1043  . furosemide (LASIX) tablet 20 mg  20 mg Oral Daily PRN Harris, Abigail, PA-C      . gabapentin (NEURONTIN) capsule 600 mg  600 mg Oral TID Maudie Flakes, MD   600 mg at 12/12/20 0939  . HYDROcodone-acetaminophen (NORCO) 10-325 MG per tablet 1 tablet  1 tablet Oral TID PRN Harris, Abigail, PA-C      . ipratropium-albuterol (DUONEB) 0.5-2.5 (3) MG/3ML nebulizer solution 3 mL  3 mL Nebulization TID Harris, Abigail, PA-C   3 mL at 12/12/20 1515  . morphine (MS CONTIN) 12 hr tablet 15 mg  15 mg Oral Q12H Harris, Abigail, PA-C   15 mg at 12/12/20 0940  . omega-3 acid ethyl esters (LOVAZA) capsule 1 g  1 g Oral Daily Harris, Abigail, PA-C   1 g at 12/12/20 0940  . polyvinyl alcohol (LIQUIFILM TEARS) 1.4 % ophthalmic solution 1 drop  1 drop Both Eyes PRN Maudie Flakes, MD      . zolpidem Lorrin Mais) tablet 5 mg  5 mg Oral QHS PRN Margarita Mail, PA-C   5 mg at 12/11/20 2303   Current Outpatient Medications  Medication Sig Dispense Refill  . ADVAIR DISKUS 250-50 MCG/DOSE AEPB Inhale 1 puff into the lungs 2 (two) times daily. 60 each 2  . albuterol (VENTOLIN HFA) 108 (90 Base) MCG/ACT inhaler Inhale 2 puffs into the lungs every 6 (six) hours as needed for wheezing or shortness of breath. 18 g 1  . aspirin EC 325 MG tablet Take 1 tablet (325 mg total) by mouth daily with breakfast. 30 tablet 3  . buPROPion (WELLBUTRIN XL) 300 MG 24 hr tablet Take 300 mg by mouth at bedtime.     . furosemide (LASIX) 20 MG tablet Take 20 mg by mouth daily as needed for fluid or edema.    . gabapentin (NEURONTIN) 600 MG tablet Take 600 mg by mouth 3 (three) times daily.    Marland Kitchen HYDROcodone Bitartrate ER 30 MG T24A Take 30 mg by mouth daily.    Marland Kitchen HYDROcodone-acetaminophen (NORCO) 10-325 MG tablet Take 1 tablet by mouth 3 (three) times daily as needed. 30 tablet 0  . ipratropium-albuterol (DUONEB) 0.5-2.5 (3) MG/3ML SOLN Take 3 mLs by nebulization 3 (three) times daily. 360 mL    . Multiple Vitamin (MULTIVITAMIN WITH MINERALS) TABS tablet Take 1 tablet by mouth daily.    . Multiple Vitamins-Minerals (ICAPS PO) Take 1 tablet by mouth daily.    . Omega-3 Fatty Acids (FISH OIL) 1000 MG CAPS Take 3 capsules by mouth daily.    Vladimir Faster Glycol-Propyl Glycol 0.4-0.3 % SOLN Apply 1-2 drops to eye daily as needed (for dry eye relief).    Marland Kitchen zolpidem (AMBIEN) 5 MG tablet Take 1 tablet (5 mg total) by mouth at bedtime as needed for sleep. 30 tablet 0     Discharge Medications: Please see discharge summary for a list of discharge medications.  Relevant Imaging Results:  Relevant Lab Results:   Additional Information Pt SSN: [id number]  Natasha Bence, LCSW

## 2020-12-12 NOTE — ED Notes (Signed)
Cleophus Molt called to check on pt and wanted people to know pt had a dog at home. Staff will tell Education officer, museum.

## 2020-12-12 NOTE — Evaluation (Signed)
Physical Therapy Evaluation Patient Details Name: Meagan Holt MRN: [id number] DOB: 1941-07-12 Today's Date: 12/12/2020   History of Present Illness  PT lives alone.  [address] and was unable to get back up.  Clinical Impression  PT will not be safe to return home. Pt has decreased strength, mobility and balance as well as significant pain causing her to be at risk for falling. Pt will benefit from skilled PT at this time to improve the above deficits and decrease her risk for falling .     Follow Up Recommendations SNF    Equipment Recommendations  None recommended by PT    Recommendations for Other Services OT consult     Precautions / Restrictions Precautions Precautions: Fall Restrictions Weight Bearing Restrictions: No      Mobility  Bed Mobility Overal bed mobility: Needs Assistance Bed Mobility: Rolling Rolling: Max assist              Transfers Overall transfer level:  (unable safely at this time)                          Pertinent Vitals/Pain Pain Assessment: Faces Faces Pain Scale: Hurts whole lot Pain Location: both hip motion; Lt greater than RT Pain Descriptors / Indicators: Crying Pain Intervention(s): Limited activity within patient's tolerance    Home Living Family/patient expects to be discharged to:: Skilled nursing facility                      Prior Function Level of Independence: Independent with assistive device(s)                  Extremity/Trunk Assessment   Upper Extremity Assessment Upper Extremity Assessment: Defer to OT evaluation    Lower Extremity Assessment Lower Extremity Assessment: Generalized weakness       Communication   Communication: No difficulties (but pt is very lethargic.)  Cognition Arousal/Alertness: Lethargic                                               Exercises General Exercises - Lower Extremity Ankle Circles/Pumps: AAROM;Both;5 reps Heel Slides:  AAROM;5 reps   Assessment/Plan    PT Assessment Patient needs continued PT services  PT Problem List Decreased strength;Decreased activity tolerance;Decreased mobility;Decreased safety awareness       PT Treatment Interventions Gait training;Functional mobility training;Therapeutic activities;Therapeutic exercise    PT Goals (Current goals can be found in the Care Plan section)  Acute Rehab PT Goals Patient Stated Goal: to lethargic for pt to participate no family or friends in room PT Goal Formulation: Patient unable to participate in goal setting    Frequency Min 3X/week   Barriers to discharge Decreased caregiver support         AM-PAC PT "6 Clicks" Mobility  Outcome Measure Help needed turning from your back to your side while in a flat bed without using bedrails?: A Lot Help needed moving from lying on your back to sitting on the side of a flat bed without using bedrails?: Total Help needed moving to and from a bed to a chair (including a wheelchair)?: Total Help needed standing up from a chair using your arms (e.g., wheelchair or bedside chair)?: Total Help needed to walk in hospital room?: Total Help needed climbing 3-5 steps with a railing? :  Total 6 Click Score: 7    End of Session   Activity Tolerance: Patient limited by lethargy;Patient limited by pain     PT Visit Diagnosis: Unsteadiness on feet (R26.81);History of falling (Z91.81);Muscle weakness (generalized) (M62.81);Difficulty in walking, not elsewhere classified (R26.2)    Time: 1100-1130 PT Time Calculation (min) (ACUTE ONLY): 30 min   Charges:   PT Evaluation $PT Eval Moderate Complexity: 1 Mod PT Treatments $Therapeutic Exercise: 8-22 mins       Rayetta Humphrey, PT CLT [phone] 12/12/2020, 12:24 PM

## 2020-12-12 NOTE — ED Notes (Signed)
Pt resting comfortably at this time.

## 2020-12-12 NOTE — TOC Initial Note (Signed)
Transition of Care [facility]) - Initial/Assessment Note    Patient Details  Name: Meagan Holt MRN: [id number] Date of Birth: 08-Jul-1941  [facility]) CM/SW Contact:    Natasha Bence, LCSW Phone Number: 12/12/2020, 4:32 PM  Clinical Narrative:                 Patient is an 80 year old female admitted for fall. CSW notified of PT's recommendation for SNF. CSW contacted patient's friend listed as a Contact to inquire about patient's agreeableness to SNF and preferred facilities. Patient's friend Lawerance Bach reported that patient is agreeable to SNF and preferred [facility]. Patient's friend also reported that patient's second option is [facility]. CSW completed FL2 and faxed out patient. TOC to follow.   Expected Discharge Plan: Skilled Nursing Facility Barriers to Discharge: Continued Medical Work up   Patient Goals and CMS Choice Patient states their goals for this hospitalization and ongoing recovery are:: SNF CMS Medicare.gov Compare Post Acute Care list provided to:: Patient Choice offered to / list presented to : Patient  Expected Discharge Plan and Services Expected Discharge Plan: [place] In-house Referral: NA Discharge Planning Services: NA Post Acute Care Choice: NA                   DME Arranged: N/A DME Agency: NA       HH Arranged: NA HH Agency: NA        Prior Living Arrangements/Services   Lives with:: Self Patient language and need for interpreter reviewed:: Yes Do you feel safe going back to the place where you live?: Yes      Need for Family Participation in Patient Care: Yes (Comment) Care giver support system in place?: Yes (comment)   Criminal Activity/Legal Involvement Pertinent to Current Situation/Hospitalization: No - Comment as needed  Activities of Daily Living      Permission Sought/Granted                  Emotional Assessment     Affect (typically observed): Accepting,Appropriate Orientation: : Oriented  to Self,Oriented to Situation,Oriented to Place Alcohol / Substance Use: Not Applicable Psych Involvement: No (comment)  Admission diagnosis:  FALL Patient Active Problem List   Diagnosis Date Noted  . Other hyperlipidemia 06/27/2020  . Recurrent major depressive disorder, in partial remission ([place]) 06/27/2020  . Acute encephalopathy 04/26/2020  . AMS (altered mental status) 04/25/2020  . 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 Severe sepsis due to bilateral pneumonia with acute on chronic hypoxic respiratory failure 04/18/2020  . CAP (community acquired pneumonia) 04/14/2020  . Acute on chronic respiratory failure with hypoxia ([place]) 04/14/2020  . Recurrent falls 04/14/2020  . Atrial fibrillation with RVR ([place]) 04/14/2020  . Leukocytosis 04/14/2020  . Obesity (BMI 30.0-34.9) 04/14/2020  . Benign paroxysmal positional vertigo 09/08/2018  . Cellulitis of right lower leg 09/08/2018  . Orthostatic hypotension 05/22/2017  . Syncope and collapse 05/21/2017  . Chronic pain syndrome 05/21/2017  . Altered mental status 05/20/2017  . GERD (gastroesophageal reflux disease) 05/20/2017  . Depression 05/20/2017  . History of stroke 05/20/2017  . Premature beats 05/20/2017  . Cervical spondylosis without myelopathy 05/15/2017  . Lumbar spondylosis 05/15/2017  . 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 Decreased range of motion of wrist 11/28/2016  . Anxiety 05/10/2016  . Head injury 07/30/2013  . Difficulty walking 07/30/2013  . COPD (chronic obstructive pulmonary disease) ([place]) 07/30/2013   PCP:  Redmond Pulling,  Jama Flavors, MD Pharmacy:   [facility], [place] [address] Phone: [phone] Fax: [phone]  [id number] [place], [place] - [address] [place] [address] Phone: [phone] Fax: [phone]     Social Determinants of Health  (SDOH) Interventions    Readmission Risk Interventions Readmission Risk Prevention Plan 04/16/2020  Medication Screening Complete  Transportation Screening Complete  Some recent data might be hidden

## 2020-12-13 DIAGNOSIS — S60221A Contusion of right hand, initial encounter: Secondary | ICD-10-CM | POA: Diagnosis not present

## 2020-12-13 NOTE — ED Notes (Signed)
I fed pt breakfast. She ate very little.

## 2020-12-13 NOTE — ED Notes (Signed)
Spoke with family member [name] and relayed plan for pts.  [name] request to speak with Caseworkers.  Caseworker given family members phone number.

## 2020-12-13 NOTE — ED Provider Notes (Signed)
80 year old female who presented to the emergency department with frequent falls.  She is unable to ambulate and unsafe discharge.  Social work and physical therapy have been consulted for possible SNF placement.  Currently patient is resting, vitals are stable, no acute change from a medical standpoint.   Lorelle Gibbs, DO 12/13/20 [phone]

## 2020-12-13 NOTE — TOC Progression Note (Signed)
Transition of Care [facility]) - Progression Note    Patient Details  Name: Meagan Holt MRN: [id number] Date of Birth: 03/10/1941  [facility]) CM/SW [place], [place] Phone Number:  [phone] 12/13/2020, 2:25 PM  Clinical Narrative:     CSW spoke with patient and niece Creed Copper [phone] and updated on SNF placement bed offers.  Both patient and Ms. Rosendo Gros agreed on [facility] placement.  CSW contacted Ebony Hail at [facility] for [employer] waiver confirmation.  Expected Discharge Plan: [place] Barriers to Discharge: Continued Medical Work up  Expected Discharge Plan and Services Expected Discharge Plan: [place] In-house Referral: NA Discharge Planning Services: NA Post Acute Care Choice: NA                   DME Arranged: N/A DME Agency: NA       HH Arranged: NA HH Agency: NA         Social Determinants of Health (SDOH) Interventions    Readmission Risk Interventions Readmission Risk Prevention Plan 04/16/2020  Medication Screening Complete  Transportation Screening Complete  Some recent data might be hidden

## 2020-12-13 NOTE — ED Notes (Signed)
Pt feed ate 40% of meal.

## 2020-12-13 NOTE — ED Notes (Signed)
Assisted pt with meal, feed patient.  Patient ate 50% of lunch.

## 2020-12-14 ENCOUNTER — Ambulatory Visit: Payer: Medicare Other | Admitting: Orthopaedic Surgery

## 2020-12-14 DIAGNOSIS — S60221A Contusion of right hand, initial encounter: Secondary | ICD-10-CM | POA: Diagnosis not present

## 2020-12-14 LAB — RESP PANEL BY RT-PCR (FLU A&B, COVID) ARPGX2
Influenza A by PCR: NEGATIVE
Influenza B by PCR: NEGATIVE
SARS Coronavirus 2 by RT PCR: NEGATIVE

## 2020-12-14 NOTE — ED Notes (Signed)
Bunny boots placed bilaterally.

## 2020-12-14 NOTE — ED Provider Notes (Signed)
Discharge summary.  Patient is an 80 year old female that has been having frequent falls she has a history of COPD hiatal hernia stroke depression chronic back pain.  She is unable to take care of her self at home she is stable in the emergency department and will be discharged to the nursing home   Milton Ferguson, MD 12/14/20 1015

## 2020-12-14 NOTE — ED Notes (Signed)
C/o severe pain to both heels.  Bunny boot ordered.

## 2020-12-14 NOTE — ED Notes (Signed)
Healing bruse noted to right eye.

## 2020-12-14 NOTE — Discharge Instructions (Signed)
Patient needs to go to the [facility] in [place]

## 2020-12-14 NOTE — TOC Transition Note (Signed)
Transition of Care [facility]) - CM/SW Discharge Note   Patient Details  Name: Meagan Holt MRN: [id number] Date of Birth: Apr 05, 1941  Transition of Care [facility]) CM/SW Contact:  Ova Freshwater Phone Number: [phone] 12/14/2020, 9:57 AM   Clinical Narrative:     Patient will d/c to [facility], room [phone], report# [id number].  EDP/ED RN updated.    Final next level of care: Skilled Nursing Facility Barriers to Discharge: Continued Medical Work up   Patient Goals and CMS Choice Patient states their goals for this hospitalization and ongoing recovery are:: SNF CMS Medicare.gov Compare Post Acute Care list provided to:: Patient Choice offered to / list presented to : Patient  Discharge Placement                       Discharge Plan and Services In-house Referral: NA Discharge Planning Services: NA Post Acute Care Choice: NA          DME Arranged: N/A DME Agency: NA       HH Arranged: NA HH Agency: NA        Social Determinants of Health (SDOH) Interventions     Readmission Risk Interventions Readmission Risk Prevention Plan 04/16/2020  Medication Screening Complete  Transportation Screening Complete  Some recent data might be hidden

## 2020-12-14 NOTE — ED Notes (Signed)
Pericare done

## 2020-12-14 NOTE — TOC Progression Note (Signed)
Transition of Care [facility]) - Progression Note    Patient Details  Name: Meagan Holt MRN: [id number] Date of Birth: 08/08/1941  Transition of Care [facility]) CM/SW [place], [place] Phone Number: [phone] 12/14/2020, 9:34 AM  Clinical Narrative:     CSW received confirmation from [facility] at [facility] [place] waiver has been approved and patient has bed offer.  Ebony Hail will contact this CSW with room and report number.   Expected Discharge Plan: [place] Barriers to Discharge: Continued Medical Work up  Expected Discharge Plan and Services Expected Discharge Plan: [place] In-house Referral: NA Discharge Planning Services: NA Post Acute Care Choice: NA                   DME Arranged: N/A DME Agency: NA       HH Arranged: NA HH Agency: NA         Social Determinants of Health (SDOH) Interventions    Readmission Risk Interventions Readmission Risk Prevention Plan 04/16/2020  Medication Screening Complete  Transportation Screening Complete  Some recent data might be hidden

## 2021-05-11 ENCOUNTER — Other Ambulatory Visit: Payer: Self-pay

## 2021-05-11 ENCOUNTER — Encounter (HOSPITAL_COMMUNITY): Payer: Self-pay | Admitting: Emergency Medicine

## 2021-05-11 ENCOUNTER — Inpatient Hospital Stay (HOSPITAL_COMMUNITY)
Admission: EM | Admit: 2021-05-11 | Discharge: 2021-05-24 | DRG: 190 | Disposition: A | Discharge status: Skilled Nursing Facility | Payer: Medicare Other | Attending: Internal Medicine | Admitting: Internal Medicine

## 2021-05-11 ENCOUNTER — Emergency Department (HOSPITAL_COMMUNITY): Payer: Medicare Other

## 2021-05-11 DIAGNOSIS — H548 Legal blindness, as defined in USA: Secondary | ICD-10-CM | POA: Diagnosis present

## 2021-05-11 DIAGNOSIS — Z8701 Personal history of pneumonia (recurrent): Secondary | ICD-10-CM

## 2021-05-11 DIAGNOSIS — E860 Dehydration: Secondary | ICD-10-CM | POA: Diagnosis present

## 2021-05-11 DIAGNOSIS — L304 Erythema intertrigo: Secondary | ICD-10-CM | POA: Diagnosis present

## 2021-05-11 DIAGNOSIS — Z66 Do not resuscitate: Secondary | ICD-10-CM | POA: Diagnosis present

## 2021-05-11 DIAGNOSIS — Z91040 Latex allergy status: Secondary | ICD-10-CM

## 2021-05-11 DIAGNOSIS — E876 Hypokalemia: Secondary | ICD-10-CM | POA: Diagnosis present

## 2021-05-11 DIAGNOSIS — J811 Chronic pulmonary edema: Secondary | ICD-10-CM

## 2021-05-11 DIAGNOSIS — R54 Age-related physical debility: Secondary | ICD-10-CM | POA: Diagnosis present

## 2021-05-11 DIAGNOSIS — R0902 Hypoxemia: Secondary | ICD-10-CM

## 2021-05-11 DIAGNOSIS — R296 Repeated falls: Secondary | ICD-10-CM

## 2021-05-11 DIAGNOSIS — Z87442 Personal history of urinary calculi: Secondary | ICD-10-CM

## 2021-05-11 DIAGNOSIS — J9621 Acute and chronic respiratory failure with hypoxia: Secondary | ICD-10-CM | POA: Diagnosis not present

## 2021-05-11 DIAGNOSIS — R0602 Shortness of breath: Secondary | ICD-10-CM

## 2021-05-11 DIAGNOSIS — M549 Dorsalgia, unspecified: Secondary | ICD-10-CM | POA: Diagnosis present

## 2021-05-11 DIAGNOSIS — R197 Diarrhea, unspecified: Secondary | ICD-10-CM | POA: Diagnosis present

## 2021-05-11 DIAGNOSIS — Z6827 Body mass index (BMI) 27.0-27.9, adult: Secondary | ICD-10-CM

## 2021-05-11 DIAGNOSIS — R531 Weakness: Secondary | ICD-10-CM

## 2021-05-11 DIAGNOSIS — K219 Gastro-esophageal reflux disease without esophagitis: Secondary | ICD-10-CM | POA: Diagnosis present

## 2021-05-11 DIAGNOSIS — G8929 Other chronic pain: Secondary | ICD-10-CM | POA: Diagnosis present

## 2021-05-11 DIAGNOSIS — Z20822 Contact with and (suspected) exposure to covid-19: Secondary | ICD-10-CM | POA: Diagnosis present

## 2021-05-11 DIAGNOSIS — Z9981 Dependence on supplemental oxygen: Secondary | ICD-10-CM

## 2021-05-11 DIAGNOSIS — J441 Chronic obstructive pulmonary disease with (acute) exacerbation: Secondary | ICD-10-CM | POA: Diagnosis not present

## 2021-05-11 DIAGNOSIS — Z8673 Personal history of transient ischemic attack (TIA), and cerebral infarction without residual deficits: Secondary | ICD-10-CM

## 2021-05-11 DIAGNOSIS — D7589 Other specified diseases of blood and blood-forming organs: Secondary | ICD-10-CM | POA: Diagnosis present

## 2021-05-11 DIAGNOSIS — T502X5A Adverse effect of carbonic-anhydrase inhibitors, benzothiadiazides and other diuretics, initial encounter: Secondary | ICD-10-CM | POA: Diagnosis present

## 2021-05-11 DIAGNOSIS — J439 Emphysema, unspecified: Secondary | ICD-10-CM | POA: Diagnosis not present

## 2021-05-11 DIAGNOSIS — E440 Moderate protein-calorie malnutrition: Secondary | ICD-10-CM | POA: Insufficient documentation

## 2021-05-11 DIAGNOSIS — R739 Hyperglycemia, unspecified: Secondary | ICD-10-CM | POA: Diagnosis not present

## 2021-05-11 DIAGNOSIS — M1990 Unspecified osteoarthritis, unspecified site: Secondary | ICD-10-CM | POA: Diagnosis present

## 2021-05-11 DIAGNOSIS — J189 Pneumonia, unspecified organism: Secondary | ICD-10-CM | POA: Diagnosis present

## 2021-05-11 DIAGNOSIS — Z79899 Other long term (current) drug therapy: Secondary | ICD-10-CM

## 2021-05-11 DIAGNOSIS — D539 Nutritional anemia, unspecified: Secondary | ICD-10-CM | POA: Diagnosis present

## 2021-05-11 DIAGNOSIS — Z881 Allergy status to other antibiotic agents status: Secondary | ICD-10-CM

## 2021-05-11 DIAGNOSIS — E875 Hyperkalemia: Secondary | ICD-10-CM | POA: Diagnosis not present

## 2021-05-11 DIAGNOSIS — F32A Depression, unspecified: Secondary | ICD-10-CM | POA: Diagnosis present

## 2021-05-11 DIAGNOSIS — Z9884 Bariatric surgery status: Secondary | ICD-10-CM

## 2021-05-11 DIAGNOSIS — Z7951 Long term (current) use of inhaled steroids: Secondary | ICD-10-CM

## 2021-05-11 DIAGNOSIS — Z7901 Long term (current) use of anticoagulants: Secondary | ICD-10-CM

## 2021-05-11 DIAGNOSIS — D1431 Benign neoplasm of right bronchus and lung: Secondary | ICD-10-CM | POA: Diagnosis present

## 2021-05-11 DIAGNOSIS — Y92230 Patient room in hospital as the place of occurrence of the external cause: Secondary | ICD-10-CM | POA: Diagnosis not present

## 2021-05-11 DIAGNOSIS — F17210 Nicotine dependence, cigarettes, uncomplicated: Secondary | ICD-10-CM | POA: Diagnosis present

## 2021-05-11 DIAGNOSIS — Z7982 Long term (current) use of aspirin: Secondary | ICD-10-CM

## 2021-05-11 DIAGNOSIS — E785 Hyperlipidemia, unspecified: Secondary | ICD-10-CM | POA: Diagnosis present

## 2021-05-11 DIAGNOSIS — D509 Iron deficiency anemia, unspecified: Secondary | ICD-10-CM | POA: Diagnosis present

## 2021-05-11 DIAGNOSIS — I482 Chronic atrial fibrillation, unspecified: Secondary | ICD-10-CM | POA: Diagnosis present

## 2021-05-11 DIAGNOSIS — E669 Obesity, unspecified: Secondary | ICD-10-CM | POA: Diagnosis present

## 2021-05-11 DIAGNOSIS — I5033 Acute on chronic diastolic (congestive) heart failure: Secondary | ICD-10-CM | POA: Diagnosis present

## 2021-05-11 LAB — CBC WITH DIFFERENTIAL/PLATELET
Abs Immature Granulocytes: 0.12 10*3/uL — ABNORMAL HIGH (ref 0.00–0.07)
Basophils Absolute: 0 10*3/uL (ref 0.0–0.1)
Basophils Relative: 0 %
Eosinophils Absolute: 0 10*3/uL (ref 0.0–0.5)
Eosinophils Relative: 0 %
HCT: 37.2 % (ref 36.0–46.0)
Hemoglobin: 11.6 g/dL — ABNORMAL LOW (ref 12.0–15.0)
Immature Granulocytes: 1 %
Lymphocytes Relative: 5 %
Lymphs Abs: 0.7 10*3/uL (ref 0.7–4.0)
MCH: 30.9 pg (ref 26.0–34.0)
MCHC: 31.2 g/dL (ref 30.0–36.0)
MCV: 99.2 fL (ref 80.0–100.0)
Monocytes Absolute: 1.1 10*3/uL — ABNORMAL HIGH (ref 0.1–1.0)
Monocytes Relative: 9 %
Neutro Abs: 11.4 10*3/uL — ABNORMAL HIGH (ref 1.7–7.7)
Neutrophils Relative %: 85 %
Platelets: 187 10*3/uL (ref 150–400)
RBC: 3.75 MIL/uL — ABNORMAL LOW (ref 3.87–5.11)
RDW: 14.8 % (ref 11.5–15.5)
WBC: 13.4 10*3/uL — ABNORMAL HIGH (ref 4.0–10.5)
nRBC: 0 % (ref 0.0–0.2)

## 2021-05-11 LAB — BRAIN NATRIURETIC PEPTIDE: B Natriuretic Peptide: 656 pg/mL — ABNORMAL HIGH (ref 0.0–100.0)

## 2021-05-11 LAB — COMPREHENSIVE METABOLIC PANEL
ALT: 18 U/L (ref 0–44)
AST: 17 U/L (ref 15–41)
Albumin: 2.7 g/dL — ABNORMAL LOW (ref 3.5–5.0)
Alkaline Phosphatase: 91 U/L (ref 38–126)
Anion gap: 6 (ref 5–15)
BUN: 19 mg/dL (ref 8–23)
CO2: 28 mmol/L (ref 22–32)
Calcium: 8.4 mg/dL — ABNORMAL LOW (ref 8.9–10.3)
Chloride: 102 mmol/L (ref 98–111)
Creatinine, Ser: 0.62 mg/dL (ref 0.44–1.00)
GFR, Estimated: >60 mL/min (ref >60)
Glucose, Bld: 104 mg/dL — ABNORMAL HIGH (ref 70–99)
Potassium: 4 mmol/L (ref 3.5–5.1)
Sodium: 136 mmol/L (ref 135–145)
Total Bilirubin: 1.4 mg/dL — ABNORMAL HIGH (ref 0.3–1.2)
Total Protein: 6.1 g/dL — ABNORMAL LOW (ref 6.5–8.1)

## 2021-05-11 LAB — RESP PANEL BY RT-PCR (FLU A&B, COVID) ARPGX2
Influenza A by PCR: NEGATIVE
Influenza B by PCR: NEGATIVE
SARS Coronavirus 2 by RT PCR: NEGATIVE

## 2021-05-11 LAB — TROPONIN I (HIGH SENSITIVITY)
Troponin I (High Sensitivity): 18 ng/L — ABNORMAL HIGH (ref <18)
Troponin I (High Sensitivity): 18 ng/L — ABNORMAL HIGH (ref <18)

## 2021-05-11 MED ORDER — IPRATROPIUM-ALBUTEROL 0.5-2.5 (3) MG/3ML IN SOLN
3.0000 mL | Freq: Once | RESPIRATORY_TRACT | Status: DC
  Filled 2021-05-11: qty 3

## 2021-05-11 MED ORDER — POLYETHYLENE GLYCOL 3350 17 G PO PACK
17.0000 g | PACK | Freq: Every day | ORAL | Status: DC | PRN
  Administered 2021-05-22 – 2021-05-23 (×2): 17 g via ORAL
  Filled 2021-05-11 (×2): qty 1

## 2021-05-11 MED ORDER — ACETAMINOPHEN 325 MG PO TABS
650.0000 mg | ORAL_TABLET | Freq: Four times a day (QID) | ORAL | Status: DC | PRN
  Administered 2021-05-19: 650 mg via ORAL
  Filled 2021-05-11 (×2): qty 2

## 2021-05-11 MED ORDER — IPRATROPIUM-ALBUTEROL 0.5-2.5 (3) MG/3ML IN SOLN
3.0000 mL | Freq: Once | RESPIRATORY_TRACT | Status: AC
  Administered 2021-05-11: 3 mL via RESPIRATORY_TRACT

## 2021-05-11 MED ORDER — SODIUM CHLORIDE 0.9 % IV SOLN
500.0000 mg | INTRAVENOUS | Status: DC
  Administered 2021-05-11: 500 mg via INTRAVENOUS
  Filled 2021-05-11: qty 500

## 2021-05-11 MED ORDER — IPRATROPIUM-ALBUTEROL 0.5-2.5 (3) MG/3ML IN SOLN
3.0000 mL | Freq: Four times a day (QID) | RESPIRATORY_TRACT | Status: AC
  Administered 2021-05-12 (×4): 3 mL via RESPIRATORY_TRACT
  Filled 2021-05-11 (×4): qty 3

## 2021-05-11 MED ORDER — METOPROLOL SUCCINATE ER 25 MG PO TB24
12.5000 mg | ORAL_TABLET | Freq: Every day | ORAL | Status: DC
  Administered 2021-05-12 – 2021-05-24 (×12): 12.5 mg via ORAL
  Filled 2021-05-11 (×14): qty 1

## 2021-05-11 MED ORDER — RIVAROXABAN 20 MG PO TABS
20.0000 mg | ORAL_TABLET | Freq: Every day | ORAL | Status: DC
  Administered 2021-05-12 – 2021-05-24 (×13): 20 mg via ORAL
  Filled 2021-05-11 (×13): qty 1

## 2021-05-11 MED ORDER — ONDANSETRON HCL 4 MG/2ML IJ SOLN: 4.0000 mg | Freq: Four times a day (QID) | INTRAMUSCULAR | Status: DC | PRN

## 2021-05-11 MED ORDER — IPRATROPIUM BROMIDE 0.02 % IN SOLN: 2.5000 mL | Freq: Once | RESPIRATORY_TRACT | Status: DC

## 2021-05-11 MED ORDER — ACETAMINOPHEN 650 MG RE SUPP
650.0000 mg | Freq: Four times a day (QID) | RECTAL | Status: DC | PRN
Start: 2021-05-11 — End: 2021-05-12

## 2021-05-11 MED ORDER — ONDANSETRON HCL 4 MG PO TABS: 4.0000 mg | ORAL_TABLET | Freq: Four times a day (QID) | ORAL | Status: DC | PRN

## 2021-05-11 MED ORDER — ALBUTEROL SULFATE HFA 108 (90 BASE) MCG/ACT IN AERS
4.0000 | INHALATION_SPRAY | Freq: Once | RESPIRATORY_TRACT | Status: AC
  Administered 2021-05-11: 4 via RESPIRATORY_TRACT
  Filled 2021-05-11: qty 6.7

## 2021-05-11 MED ORDER — IPRATROPIUM-ALBUTEROL 0.5-2.5 (3) MG/3ML IN SOLN
3.0000 mL | RESPIRATORY_TRACT | Status: DC | PRN
  Administered 2021-05-13: 3 mL via RESPIRATORY_TRACT
  Filled 2021-05-11: qty 3

## 2021-05-11 MED ORDER — GUAIFENESIN-DM 100-10 MG/5ML PO SYRP
10.0000 mL | ORAL_SOLUTION | Freq: Three times a day (TID) | ORAL | Status: DC
  Administered 2021-05-11 – 2021-05-12 (×3): 10 mL via ORAL
  Filled 2021-05-11 (×3): qty 10

## 2021-05-11 MED ORDER — METHYLPREDNISOLONE SODIUM SUCC 125 MG IJ SOLR
125.0000 mg | Freq: Once | INTRAMUSCULAR | Status: AC
  Administered 2021-05-11: 125 mg via INTRAVENOUS
  Filled 2021-05-11: qty 2

## 2021-05-11 MED ORDER — IPRATROPIUM-ALBUTEROL 0.5-2.5 (3) MG/3ML IN SOLN: 3.0000 mL | Freq: Four times a day (QID) | RESPIRATORY_TRACT | Status: DC

## 2021-05-11 NOTE — ED Notes (Signed)
Patient transported to CT 

## 2021-05-11 NOTE — ED Notes (Signed)
Director of apartment  Clarise Cruz [phone]

## 2021-05-11 NOTE — ED Triage Notes (Signed)
Pt from home with no family. Increased falls and decreased ability to care for herself. Pt unable to ambulate well, increased weakness, sob. Sitting her in urine and feces.   Pt found to be in the 70% on room air.  EMS placed pt on 4 L [place]  The director of the [place] apartment would not allow pt to come back to apartment and she is contacting [place]

## 2021-05-11 NOTE — H&P (Signed)
History and Physical    Meagan Holt [id number] DOB: May 30, 1941 DOA: 05/11/2021  PCP: [name], MD   Patient coming from: Home  I have personally briefly reviewed patient's old medical records in [place]  Chief Complaint: Difficulty breathing  HPI: Meagan Holt is a 80 y.o. female with medical history significant for COPD, atrial fibrillation, depression. Patient was brought to the ED via EMS with reports of increasing difficulty breathing, and worsening cough.  Patient reported generalized weakness, unable to ambulate and increased falls.  Patient's land lady checked on her and called EMS.  Patient was sitting in her own urine and feces.  She reports she did not hit her head when she fell.  Patient reports she has not been eating, and reports multiple loose stools over the past 2 days.  Patient lives alone.  On EMS arrival, O2 sats were noted to be 70% on room air, she was placed on 4 L. Per triage notes, - the director of the [facility] apartment would not allow pt to come back to apartment and she is contacting DSS.  ED Course: Temperature 99.2, O2 sats recorded at 82% on room air, 4 L started weaned to 2 L.  Sats currently 94 to 98% on 2 L.  Heart rate ranging from 36-106.  Respiratory rate 19-36.  WBC 13.4.  Troponin 18x2.  Chest x-ray shows cardiomegaly with bilateral pulmonary opacities suggestive of edema.  EKG shows atrial fibrillation rate of 107.  Wheezing and rhonchi noted in ED, patient received duo nebs, Solu-Medrol 125 mg.  Review of Systems: As per HPI all other systems reviewed and negative.  Past Medical History:  Diagnosis Date   Arthritis    "all my joints" (07/30/2013)   Chronic back pain    Chronic bronchitis ([place])    "get it q year" (07/30/2013)   COPD (chronic obstructive pulmonary disease) ([place])    Depression    Falls frequently    "fell twice in the last 2 days" (07/30/2013)   GERD (gastroesophageal reflux disease)    "rarely"  (07/30/2013)   H/O hiatal hernia    History of blood transfusion    Kidney stones    Stroke [facility])     Past Surgical History:  Procedure Laterality Date   APPENDECTOMY     BACK SURGERY     "put cages in mid back; did arthroscopic OR on lower back" (11/326/2014)   BILATERAL OOPHORECTOMY Bilateral    CATARACT EXTRACTION W/ INTRAOCULAR LENS  IMPLANT, BILATERAL Bilateral    CHOLECYSTECTOMY     EXCISIONAL HEMORRHOIDECTOMY     GASTRIC BYPASS     HERNIA REPAIR     "removed my belly button too" (07/30/2013)   KIDNEY STONE SURGERY     "twice" (07/30/2013)   KNEE ARTHROSCOPY Left    TONSILLECTOMY       reports that she has been smoking cigarettes. She has a 42.00 pack-year smoking history. She has never used smokeless tobacco. She reports current alcohol use. She reports that she does not use drugs.  Allergies  Allergen Reactions   Latex Itching and Swelling   Doxycycline Rash    Family History  Problem Relation Age of Onset   Stomach cancer Father     Prior to Admission medications   Medication Sig Start Date End Date Taking? Authorizing Provider  ADVAIR DISKUS 250-50 MCG/DOSE AEPB Inhale 1 puff into the lungs 2 (two) times daily. 04/19/20   Roxan Hockey, MD  albuterol (VENTOLIN  HFA) 108 (90 Base) MCG/ACT inhaler Inhale 2 puffs into the lungs every 6 (six) hours as needed for wheezing or shortness of breath. 04/19/20   Roxan Hockey, MD  aspirin EC 325 MG tablet Take 1 tablet (325 mg total) by mouth daily with breakfast. 04/19/20   Denton Brick, Courage, MD  buPROPion (WELLBUTRIN XL) 300 MG 24 hr tablet Take 300 mg by mouth at bedtime.     [provider]  furosemide (LASIX) 20 MG tablet Take 20 mg by mouth daily as needed for fluid or edema.    [provider]  gabapentin (NEURONTIN) 600 MG tablet Take 600 mg by mouth 3 (three) times daily. 03/29/20   [provider]  HYDROcodone Bitartrate ER 30 MG T24A Take 30 mg by mouth daily. 08/27/20   [provider]  HYDROcodone-acetaminophen (NORCO) 10-325 MG tablet Take 1 tablet by mouth 3 (three) times daily as needed. Patient taking differently: Take 1 tablet by mouth 3 (three) times daily as needed for moderate pain. 04/27/20   Kathie Dike, MD  ipratropium-albuterol (DUONEB) 0.5-2.5 (3) MG/3ML SOLN Take 3 mLs by nebulization 3 (three) times daily. 04/27/20   Kathie Dike, MD  metoprolol succinate (TOPROL-XL) 25 MG 24 hr tablet Take 12.5 mg by mouth daily. 03/21/21   [provider]  Multiple Vitamin (MULTIVITAMIN WITH MINERALS) TABS tablet Take 1 tablet by mouth daily.    [provider]  Multiple Vitamins-Minerals (ICAPS PO) Take 1 tablet by mouth daily.    [provider]  Omega-3 Fatty Acids (FISH OIL) 1000 MG CAPS Take 3 capsules by mouth daily.    [provider]  Polyethyl Glycol-Propyl Glycol 0.4-0.3 % SOLN Apply 1-2 drops to eye daily as needed (for dry eye relief).    [provider]  XARELTO 20 MG TABS tablet Take 1 tablet by mouth every evening. 04/21/21   [provider]  zolpidem (AMBIEN) 5 MG tablet Take 1 tablet (5 mg total) by mouth at bedtime as needed for sleep. 04/27/20   Kathie Dike, MD    Physical Exam: Vitals:   05/11/21 1730 05/11/21 1800 05/11/21 1815 05/11/21 1940  BP: 122/73 135/74  (!) 145/84  Pulse: (!) 47 (!) 36 96 100  Resp: (!) 28 (!) 28 (!) 27 (!) 28  Temp:      TempSrc:      SpO2: 96% 95% 96% 98%    Constitutional:  calm, comfortable Vitals:   05/11/21 1730 05/11/21 1800 05/11/21 1815 05/11/21 1940  BP: 122/73 135/74  (!) 145/84  Pulse: (!) 47 (!) 36 96 100  Resp: (!) 28 (!) 28 (!) 27 (!) 28  Temp:      TempSrc:      SpO2: 96% 95% 96% 98%   Eyes: PERRL, lids and conjunctivae normal ENMT: Mucous membranes are dry  Neck: normal, supple, no masses, no thyromegaly Respiratory: Chest sounds congested, also with diffuse rhonchi, Normal respiratory effort. No accessory muscle use.   Cardiovascular: Irregular rate and rhythm, no murmurs / rubs / gallops.  Trace bilateral extremity edema.  Lower extremities warm and well-perfused.    Abdomen: no tenderness, no masses palpated. No hepatosplenomegaly. Bowel sounds positive.  Musculoskeletal: no clubbing / cyanosis. No joint deformity upper and lower extremities. Good ROM, no contractures. Normal muscle tone.  Skin: no rashes, lesions, ulcers. No induration Neurologic: No apparent cranial nerve abnormality, moving extremities spontaneously.Marland Kitchen  Psychiatric: Normal judgment and insight. Alert and oriented x 3. Normal mood.   Labs on  Admission: I have personally reviewed following labs and imaging studies  CBC: Recent Labs  Lab 05/11/21 1501  WBC 13.4*  NEUTROABS 11.4*  HGB 11.6*  HCT 37.2  MCV 99.2  PLT 123XX123   Basic Metabolic Panel: Recent Labs  Lab 05/11/21 1501  NA 136  K 4.0  CL 102  CO2 28  GLUCOSE 104*  BUN 19  CREATININE 0.62  CALCIUM 8.4*   GFR: CrCl cannot be calculated (Unknown ideal weight.). Liver Function Tests: Recent Labs  Lab 05/11/21 1501  AST 17  ALT 18  ALKPHOS 91  BILITOT 1.4*  PROT 6.1*  ALBUMIN 2.7*   Radiological Exams on Admission: DG Chest Portable 1 View  Result Date: 05/11/2021 CLINICAL DATA:  Shortness of breath, worsening weakness, history of COPD EXAM: PORTABLE CHEST 1 VIEW COMPARISON:  12/11/2020 FINDINGS: Cardiomegaly. Diffuse bilateral interstitial pulmonary opacity. The visualized skeletal structures are unremarkable. IMPRESSION: Cardiomegaly with diffuse bilateral interstitial pulmonary opacity, likely mild edema. No focal airspace opacity. Electronically Signed   By: Eddie Candle M.D.   On: 05/11/2021 15:39    EKG: Independently reviewed.  Patient rate 107.  QTc 449.  Nonspecific T wave changes without change from prior.  Assessment/Plan Principal Problem:   COPD with acute exacerbation (HCC) Active Problems:   Depression   History of stroke   Acute on chronic  respiratory failure with hypoxia (HCC)   Recurrent falls  COPD with acute exacerbation-productive cough, dyspnea, rhonchi, congestion.  Chest x-ray with bilateral pulmonary opacities suggesting edema. -Obtain BNP -DuoNebs as needed and scheduled -Mucolytic's, supp O2 - 125 mg Solu-Medrol given, continue 60 twice daily -IV azithromycin  Acute hypoxic respiratory failure-O2 sats here 82% on room air, currently on 2 L. Likely from COPD exacerbation, but chest x-ray also suggesting edema, with trace peripheral edema. Patient denies being on chronic O2.   -Obtain echocardiogram, BNP -Hold Lasix she appears dehydrated with poor oral intake.  Diarrhea x 2 days, abdominal exam benign, mild leukocytosis of 13.4.  .-Hold off on fluids for now chest x-ray suggesting edema -Obtain stool C. difficile  Atrial fibrillation-rate controlled and on anticoagulation with Xarelto.  Reports of increased falls. -Continue Xarelto, metoprolol for now -Physical therapy evaluation -Will need to reassess anticoagulation with Xarelto with reports of falls.  DVT prophylaxis: Xarelto Code Status: Full code Family Communication: None at bedside Disposition Plan: ~ 2 days Consults called: None Admission status:  Inpt tele I certify that at the point of admission it is my clinical judgment that the patient will require inpatient hospital care spanning beyond 2 midnights from the point of admission due to high intensity of service, high risk for further deterioration and high frequency of surveillance required. The following factors support the patient status of inpatient:    Bethena Roys MD [facility]  05/11/2021, 9:56 PM

## 2021-05-11 NOTE — ED Provider Notes (Signed)
[facility] EMERGENCY DEPARTMENT Provider Note   CSN: [id number] Arrival date & time: 05/11/21  1415     History Chief Complaint  Patient presents with   Weakness    Meagan Holt is a 80 y.o. female.  HPI Patient is an 80 year old female with a history of COPD, frequent falls, GERD, stroke, atrial fibrillation with RVR, who presents to the emergency department due to frequent falls as well as difficulty with ADLs.  Presents to the emergency department from home with no family per EMS notes.  Reporting increased weakness as well as shortness of breath.  She was found covered in her urine and feces.  Was found to be saturating at 70% on room air and was placed on 4 L via nasal cannula with improvement to the 90s.  Patient extremely fatigued and has difficulty answering questions but appears to be A&O x3 at this time.  Echocardiogram on April 15, 2020 shows a left ventricular ejection fraction of 60% to 65%.    Past Medical History:  Diagnosis Date   Arthritis    "all my joints" (07/30/2013)   Chronic back pain    Chronic bronchitis ([place])    "get it q year" (07/30/2013)   COPD (chronic obstructive pulmonary disease) ([place])    Depression    Falls frequently    "fell twice in the last 2 days" (07/30/2013)   GERD (gastroesophageal reflux disease)    "rarely" (07/30/2013)   H/O hiatal hernia    History of blood transfusion    Kidney stones    Stroke [facility])     Patient Active Problem List   Diagnosis Date Noted   Other hyperlipidemia 06/27/2020   Recurrent major depressive disorder, in partial remission ([place]) 06/27/2020   Acute encephalopathy 04/26/2020   AMS (altered mental status) 04/25/2020   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/19/2020   Severe sepsis due to bilateral pneumonia with acute on chronic hypoxic respiratory failure 04/18/2020   CAP (community acquired pneumonia) 04/14/2020   Acute on chronic respiratory failure with hypoxia ([place]) 04/14/2020   Recurrent falls 04/14/2020    Atrial fibrillation with RVR ([place]) 04/14/2020   Leukocytosis 04/14/2020   Obesity (BMI 30.0-34.9) 04/14/2020   Benign paroxysmal positional vertigo 09/08/2018   Cellulitis of right lower leg 09/08/2018   Orthostatic hypotension 05/22/2017   Syncope and collapse 05/21/2017   Chronic pain syndrome 05/21/2017   Altered mental status 05/20/2017   GERD (gastroesophageal reflux disease) 05/20/2017   Depression 05/20/2017   History of stroke 05/20/2017   Premature beats 05/20/2017   Cervical spondylosis without myelopathy 05/15/2017   Lumbar spondylosis 05/15/2017   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   Decreased range of motion of wrist 11/28/2016   Anxiety 05/10/2016   Head injury 07/30/2013   Difficulty walking 07/30/2013   COPD (chronic obstructive pulmonary disease) ([place]) 07/30/2013    Past Surgical History:  Procedure Laterality Date   APPENDECTOMY     BACK SURGERY     "put cages in mid back; did arthroscopic OR on lower back" (11/326/2014)   BILATERAL OOPHORECTOMY Bilateral    CATARACT EXTRACTION W/ INTRAOCULAR LENS  IMPLANT, BILATERAL Bilateral    CHOLECYSTECTOMY     EXCISIONAL HEMORRHOIDECTOMY     GASTRIC BYPASS     HERNIA REPAIR     "removed my belly button too" (07/30/2013)   KIDNEY STONE SURGERY     "twice" (07/30/2013)   KNEE ARTHROSCOPY Left    TONSILLECTOMY  OB History   No obstetric history on file.     Family History  Problem Relation Age of Onset   Stomach cancer Father     Social History   Tobacco Use   Smoking status: Every Day    Packs/day: 1.00    Years: 42.00    Pack years: 42.00    Types: Cigarettes   Smokeless tobacco: Never  Substance Use Topics   Alcohol use: Yes    Comment: rarely    Drug use: No    Home Medications Prior to Admission medications   Medication Sig Start Date End Date Taking? Authorizing Provider  ADVAIR DISKUS 250-50 MCG/DOSE AEPB Inhale 1 puff into the lungs 2 (two) times  daily. 04/19/20   Roxan Hockey, MD  albuterol (VENTOLIN HFA) 108 (90 Base) MCG/ACT inhaler Inhale 2 puffs into the lungs every 6 (six) hours as needed for wheezing or shortness of breath. 04/19/20   Roxan Hockey, MD  aspirin EC 325 MG tablet Take 1 tablet (325 mg total) by mouth daily with breakfast. 04/19/20   Denton Brick, Courage, MD  buPROPion (WELLBUTRIN XL) 300 MG 24 hr tablet Take 300 mg by mouth at bedtime.     [provider]  furosemide (LASIX) 20 MG tablet Take 20 mg by mouth daily as needed for fluid or edema.    [provider]  gabapentin (NEURONTIN) 600 MG tablet Take 600 mg by mouth 3 (three) times daily. 03/29/20   [provider]  HYDROcodone Bitartrate ER 30 MG T24A Take 30 mg by mouth daily. 08/27/20   [provider]  HYDROcodone-acetaminophen (NORCO) 10-325 MG tablet Take 1 tablet by mouth 3 (three) times daily as needed. Patient taking differently: Take 1 tablet by mouth 3 (three) times daily as needed for moderate pain. 04/27/20   Kathie Dike, MD  ipratropium-albuterol (DUONEB) 0.5-2.5 (3) MG/3ML SOLN Take 3 mLs by nebulization 3 (three) times daily. 04/27/20   Kathie Dike, MD  metoprolol succinate (TOPROL-XL) 25 MG 24 hr tablet Take 12.5 mg by mouth daily. 03/21/21   [provider]  Multiple Vitamin (MULTIVITAMIN WITH MINERALS) TABS tablet Take 1 tablet by mouth daily.    [provider]  Multiple Vitamins-Minerals (ICAPS PO) Take 1 tablet by mouth daily.    [provider]  Omega-3 Fatty Acids (FISH OIL) 1000 MG CAPS Take 3 capsules by mouth daily.    [provider]  Polyethyl Glycol-Propyl Glycol 0.4-0.3 % SOLN Apply 1-2 drops to eye daily as needed (for dry eye relief).    [provider]  XARELTO 20 MG TABS tablet Take 1 tablet by mouth every evening. 04/21/21   [provider]  zolpidem (AMBIEN) 5 MG tablet Take 1 tablet (5 mg total) by mouth at bedtime as needed for sleep.  04/27/20   Kathie Dike, MD    Allergies    Latex and Doxycycline  Review of Systems   Review of Systems  Unable to perform ROS: Acuity of condition   Physical Exam Updated Vital Signs BP 135/74   Pulse 96   Temp 99.2 F (37.3 C) (Rectal)   Resp (!) 27   SpO2 96%   Physical Exam Vitals and nursing note reviewed.  Constitutional:      General: She is not in acute distress.    Appearance: She is not ill-appearing, toxic-appearing or diaphoretic.     Comments: Well-developed elderly female.  Lying supine.  Intermittently following commands and answering questions.  Appears fatigued.  HENT:  Head: Normocephalic and atraumatic.     Right Ear: External ear normal.     Left Ear: External ear normal.     Nose: Nose normal.     Mouth/Throat:     Mouth: Mucous membranes are moist.     Pharynx: Oropharynx is clear. No oropharyngeal exudate or posterior oropharyngeal erythema.  Eyes:     Extraocular Movements: Extraocular movements intact.  Cardiovascular:     Rate and Rhythm: Normal rate and regular rhythm.     Pulses: Normal pulses.     Heart sounds: Normal heart sounds. No murmur heard.   No friction rub. No gallop.  Pulmonary:     Effort: Pulmonary effort is normal. No respiratory distress.     Breath sounds: No stridor. Wheezing and rhonchi present. No rales.  Abdominal:     General: Abdomen is flat.     Palpations: Abdomen is soft.     Tenderness: There is no abdominal tenderness.  Genitourinary:    Comments: Intertrigo noted along bilateral inguinal regions. Musculoskeletal:        General: Normal range of motion.     Cervical back: Normal range of motion and neck supple. No tenderness.  Skin:    General: Skin is warm and dry.  Neurological:     General: No focal deficit present.     Mental Status: She is oriented to person, place, and time.     Comments: A&O x3.  Moving all 4 extremities.  No gross deficits.  Psychiatric:        Mood and Affect: Mood  normal.        Behavior: Behavior normal.   ED Results / Procedures / Treatments   Labs (all labs ordered are listed, but only abnormal results are displayed) Labs Reviewed  COMPREHENSIVE METABOLIC PANEL - Abnormal; Notable for the following components:      Result Value   Glucose, Bld 104 (*)    Calcium 8.4 (*)    Total Protein 6.1 (*)    Albumin 2.7 (*)    Total Bilirubin 1.4 (*)    All other components within normal limits  CBC WITH DIFFERENTIAL/PLATELET - Abnormal; Notable for the following components:   WBC 13.4 (*)    RBC 3.75 (*)    Hemoglobin 11.6 (*)    Neutro Abs 11.4 (*)    Monocytes Absolute 1.1 (*)    Abs Immature Granulocytes 0.12 (*)    All other components within normal limits  TROPONIN I (HIGH SENSITIVITY) - Abnormal; Notable for the following components:   Troponin I (High Sensitivity) 18 (*)    All other components within normal limits  TROPONIN I (HIGH SENSITIVITY) - Abnormal; Notable for the following components:   Troponin I (High Sensitivity) 18 (*)    All other components within normal limits  RESP PANEL BY RT-PCR (FLU A&B, COVID) ARPGX2  URINALYSIS, ROUTINE W REFLEX MICROSCOPIC   EKG EKG Interpretation  Date/Time:  Wednesday May 11 2021 14:25:16 EDT Ventricular Rate:  107 PR Interval:    QRS Duration: 102 QT Interval:  355 QTC Calculation: 449 R Axis:   122 Text Interpretation: Atrial fibrillation Low voltage, precordial leads Probable right ventricular hypertrophy Nonspecific T abnormalities, lateral leads No significant change since prior 4/22 Confirmed by Aletta Edouard [phone]) on 05/11/2021 4:33:17 PM  Radiology DG Chest Portable 1 View  Result Date: 05/11/2021 CLINICAL DATA:  Shortness of breath, worsening weakness, history of COPD EXAM: PORTABLE CHEST 1 VIEW COMPARISON:  12/11/2020 FINDINGS: Cardiomegaly. Diffuse  bilateral interstitial pulmonary opacity. The visualized skeletal structures are unremarkable. IMPRESSION: Cardiomegaly with  diffuse bilateral interstitial pulmonary opacity, likely mild edema. No focal airspace opacity. Electronically Signed   By: Eddie Candle M.D.   On: 05/11/2021 15:39    Procedures Procedures   Medications Ordered in ED Medications  albuterol (VENTOLIN HFA) 108 (90 Base) MCG/ACT inhaler 4 puff (4 puffs Inhalation Given 05/11/21 1515)  ipratropium-albuterol (DUONEB) 0.5-2.5 (3) MG/3ML nebulizer solution 3 mL (3 mLs Nebulization Given 05/11/21 1740)  methylPREDNISolone sodium succinate (SOLU-MEDROL) 125 mg/2 mL injection 125 mg (125 mg Intravenous Given 05/11/21 1620)    ED Course  I have reviewed the triage vital signs and the nursing notes.  Pertinent labs & imaging results that were available during my care of the patient were reviewed by me and considered in my medical decision making (see chart for details).  Clinical Course as of 05/11/21 1930  Wed Sep 07, 954  [id number] 80 year old female coming from home for evaluation of failure to thrive recent falls.  Inability to take care of self.  She is hypoxic and requiring oxygen.  A. fib which has been there since at least prior EKG in April.  Not on anticoagulation.  Getting labs chest x-ray and will need admission for further management. [MB]    Clinical Course User Index [MB] Hayden Rasmussen, MD   MDM Rules/Calculators/A&P                          Pt is a 80 y.o. female with history of COPD presents to the emergency department due to hypoxia.  Labs: CBC with leukocytosis of 13.4, hemoglobin of 11.6, neutrophils of 11.4, monocytes of 1.1, absolute immature granulocytes of 0.12. CMP with a glucose of 104, calcium of 8.4, total protein of 6.1, albumin of 2.7, total bilirubin of 1.4. Troponin of 18 with a repeat of 18. Respiratory panel is negative.  Imaging: Chest x-ray shows cardiomegaly with diffuse bilateral interstitial pulmonary opacities which are likely mild edema.  No focal airspace opacity.  ECG: Atrial fibrillation.  Probable  right ventricular hypertrophy.  Nonspecific T abnormalities in the lateral leads.  No significant change since prior tracing.  I, Rayna Sexton, PA-C, personally reviewed and evaluated these images and lab results as part of my medical decision-making.  Patient initially was hypoxic in the 70s on EMS arrival.  She was placed on 4 L via nasal cannula with improvement into the 90s.  Rhonchorous and wheezing on my exam.  Patient given Solu-Medrol, albuterol, as well as multiple duo nebs.  Still with wheezing and rhonchi.  Saturations fluctuating in the low to mid 90s on 4 L via nasal cannula.  Patient denies having oxygen at home.  Respiratory panel is negative.  Likely a COPD exacerbation.  Patient will require admission for further management.  We will discussed with the medicine team.  Note: Portions of this report may have been transcribed using voice recognition software. Every effort was made to ensure accuracy; however, inadvertent computerized transcription errors may be present.   Final Clinical Impression(s) / ED Diagnoses Final diagnoses:  Weakness  Hypoxia  COPD exacerbation [facility])   Rx / DC Orders ED Discharge Orders     None        Rayna Sexton, PA-C 05/11/21 1930    Milton Ferguson, MD 05/14/21 1327

## 2021-05-11 NOTE — ED Notes (Addendum)
Pt 82% on room air. Placed on 4 L [place]

## 2021-05-12 ENCOUNTER — Inpatient Hospital Stay (HOSPITAL_COMMUNITY): Payer: Medicare Other

## 2021-05-12 DIAGNOSIS — K219 Gastro-esophageal reflux disease without esophagitis: Secondary | ICD-10-CM | POA: Diagnosis present

## 2021-05-12 DIAGNOSIS — J441 Chronic obstructive pulmonary disease with (acute) exacerbation: Secondary | ICD-10-CM | POA: Diagnosis not present

## 2021-05-12 DIAGNOSIS — Z7189 Other specified counseling: Secondary | ICD-10-CM | POA: Diagnosis not present

## 2021-05-12 DIAGNOSIS — R0603 Acute respiratory distress: Secondary | ICD-10-CM | POA: Diagnosis not present

## 2021-05-12 DIAGNOSIS — E669 Obesity, unspecified: Secondary | ICD-10-CM | POA: Diagnosis present

## 2021-05-12 DIAGNOSIS — J189 Pneumonia, unspecified organism: Secondary | ICD-10-CM | POA: Diagnosis not present

## 2021-05-12 DIAGNOSIS — R531 Weakness: Secondary | ICD-10-CM | POA: Diagnosis present

## 2021-05-12 DIAGNOSIS — F32A Depression, unspecified: Secondary | ICD-10-CM | POA: Diagnosis present

## 2021-05-12 DIAGNOSIS — E876 Hypokalemia: Secondary | ICD-10-CM | POA: Diagnosis present

## 2021-05-12 DIAGNOSIS — Y92230 Patient room in hospital as the place of occurrence of the external cause: Secondary | ICD-10-CM | POA: Diagnosis not present

## 2021-05-12 DIAGNOSIS — J9621 Acute and chronic respiratory failure with hypoxia: Secondary | ICD-10-CM | POA: Diagnosis not present

## 2021-05-12 DIAGNOSIS — Z66 Do not resuscitate: Secondary | ICD-10-CM | POA: Diagnosis not present

## 2021-05-12 DIAGNOSIS — R9389 Abnormal findings on diagnostic imaging of other specified body structures: Secondary | ICD-10-CM | POA: Diagnosis not present

## 2021-05-12 DIAGNOSIS — F17210 Nicotine dependence, cigarettes, uncomplicated: Secondary | ICD-10-CM | POA: Diagnosis present

## 2021-05-12 DIAGNOSIS — E440 Moderate protein-calorie malnutrition: Secondary | ICD-10-CM | POA: Diagnosis not present

## 2021-05-12 DIAGNOSIS — E860 Dehydration: Secondary | ICD-10-CM | POA: Diagnosis present

## 2021-05-12 DIAGNOSIS — D539 Nutritional anemia, unspecified: Secondary | ICD-10-CM | POA: Diagnosis not present

## 2021-05-12 DIAGNOSIS — D1431 Benign neoplasm of right bronchus and lung: Secondary | ICD-10-CM | POA: Diagnosis present

## 2021-05-12 DIAGNOSIS — D509 Iron deficiency anemia, unspecified: Secondary | ICD-10-CM | POA: Diagnosis not present

## 2021-05-12 DIAGNOSIS — L304 Erythema intertrigo: Secondary | ICD-10-CM | POA: Diagnosis present

## 2021-05-12 DIAGNOSIS — I482 Chronic atrial fibrillation, unspecified: Secondary | ICD-10-CM | POA: Diagnosis not present

## 2021-05-12 DIAGNOSIS — R911 Solitary pulmonary nodule: Secondary | ICD-10-CM | POA: Diagnosis not present

## 2021-05-12 DIAGNOSIS — E785 Hyperlipidemia, unspecified: Secondary | ICD-10-CM | POA: Diagnosis present

## 2021-05-12 DIAGNOSIS — D7589 Other specified diseases of blood and blood-forming organs: Secondary | ICD-10-CM | POA: Diagnosis present

## 2021-05-12 DIAGNOSIS — Z9981 Dependence on supplemental oxygen: Secondary | ICD-10-CM | POA: Diagnosis not present

## 2021-05-12 DIAGNOSIS — Z515 Encounter for palliative care: Secondary | ICD-10-CM | POA: Diagnosis not present

## 2021-05-12 DIAGNOSIS — R197 Diarrhea, unspecified: Secondary | ICD-10-CM | POA: Diagnosis present

## 2021-05-12 DIAGNOSIS — Z20822 Contact with and (suspected) exposure to covid-19: Secondary | ICD-10-CM | POA: Diagnosis not present

## 2021-05-12 DIAGNOSIS — I5033 Acute on chronic diastolic (congestive) heart failure: Secondary | ICD-10-CM | POA: Diagnosis not present

## 2021-05-12 DIAGNOSIS — J439 Emphysema, unspecified: Secondary | ICD-10-CM | POA: Diagnosis not present

## 2021-05-12 DIAGNOSIS — R54 Age-related physical debility: Secondary | ICD-10-CM | POA: Diagnosis not present

## 2021-05-12 LAB — CBC
HCT: 37.3 % (ref 36.0–46.0)
Hemoglobin: 11 g/dL — ABNORMAL LOW (ref 12.0–15.0)
MCH: 30.4 pg (ref 26.0–34.0)
MCHC: 29.5 g/dL — ABNORMAL LOW (ref 30.0–36.0)
MCV: 103 fL — ABNORMAL HIGH (ref 80.0–100.0)
Platelets: 166 10*3/uL (ref 150–400)
RBC: 3.62 MIL/uL — ABNORMAL LOW (ref 3.87–5.11)
RDW: 14.7 % (ref 11.5–15.5)
WBC: 8.7 10*3/uL (ref 4.0–10.5)
nRBC: 0 % (ref 0.0–0.2)

## 2021-05-12 LAB — ECHOCARDIOGRAM COMPLETE
AR max vel: 1.62 cm2
AV Area VTI: 1.53 cm2
AV Area mean vel: 1.47 cm2
AV Mean grad: 6.5 mmHg
AV Peak grad: 12.7 mmHg
Ao pk vel: 1.78 m/s
Area-P 1/2: 5.46 cm2
MV VTI: 1.43 cm2
S' Lateral: 3.03 cm

## 2021-05-12 LAB — BASIC METABOLIC PANEL
Anion gap: 10 (ref 5–15)
BUN: 16 mg/dL (ref 8–23)
CO2: 24 mmol/L (ref 22–32)
Calcium: 8.3 mg/dL — ABNORMAL LOW (ref 8.9–10.3)
Chloride: 103 mmol/L (ref 98–111)
Creatinine, Ser: 0.56 mg/dL (ref 0.44–1.00)
GFR, Estimated: >60 mL/min (ref >60)
Glucose, Bld: 118 mg/dL — ABNORMAL HIGH (ref 70–99)
Potassium: 3.8 mmol/L (ref 3.5–5.1)
Sodium: 137 mmol/L (ref 135–145)

## 2021-05-12 MED ORDER — FUROSEMIDE 10 MG/ML IJ SOLN
40.0000 mg | Freq: Once | INTRAMUSCULAR | Status: AC
  Administered 2021-05-12: 40 mg via INTRAVENOUS
  Filled 2021-05-12: qty 4

## 2021-05-12 MED ORDER — ZOLPIDEM TARTRATE 5 MG PO TABS
5.0000 mg | ORAL_TABLET | Freq: Every evening | ORAL | Status: DC | PRN
  Administered 2021-05-19 – 2021-05-23 (×3): 5 mg via ORAL
  Filled 2021-05-12 (×3): qty 1

## 2021-05-12 NOTE — ED Notes (Signed)
ECHO @ bedside

## 2021-05-12 NOTE — ED Notes (Signed)
PT James @ bedside to evaluate patient.

## 2021-05-12 NOTE — Evaluation (Signed)
Physical Therapy Evaluation Patient Details Name: Meagan Holt MRN: [id number] DOB: Dec 30, 1940 Today's Date: 05/12/2021   History of Present Illness  Meagan Holt is a 80 y.o. female with medical history significant for COPD, atrial fibrillation, depression.  Patient was brought to the ED via EMS with reports of increasing difficulty breathing, and worsening cough.  Patient reported generalized weakness, unable to ambulate and increased falls.  Patient's land lady checked on her and called EMS.  Patient was sitting in her own urine and feces.  She reports she did not hit her head when she fell.  Patient reports she has not been eating, and reports multiple loose stools over the past 2 days.  Patient lives alone.   Clinical Impression  Patient demonstrates slow labored movement for sitting up at bedside with difficulty propping up on elbows due to generalized weakness, at high to mod risk for falls and limited to a few steps forward/backwards at bedside due to c/o fatigue and right knee weakness.  Patient tolerated sitting up in chair after therapy - RN aware.  Patient will benefit from continued physical therapy in hospital and recommended venue below to increase strength, balance, endurance for safe ADLs and gait.       Follow Up Recommendations SNF    Equipment Recommendations  None recommended by PT    Recommendations for Other Services       Precautions / Restrictions Precautions Precautions: Fall;Other (comment) Precaution Comments: Patient legally blind Restrictions Weight Bearing Restrictions: No      Mobility  Bed Mobility Overal bed mobility: Needs Assistance Bed Mobility: Supine to Sit     Supine to sit: Mod assist     General bed mobility comments: slow labored movement    Transfers Overall transfer level: Needs assistance Equipment used: Rolling walker (2 wheeled) Transfers: Sit to/from [facility] Sit to Stand: Min guard Stand pivot  transfers: Min assist       General transfer comment: increased time, labored movement  Ambulation/Gait Ambulation/Gait assistance: Min assist;Mod assist Gait Distance (Feet): 5 Feet Assistive device: Rolling walker (2 wheeled) Gait Pattern/deviations: Decreased step length - right;Decreased step length - left;Decreased stride length Gait velocity: decreased   General Gait Details: limited to a few steps forward/backwards at bedside mostly due to c/o fatigue and right knee weakness  Stairs            Wheelchair Mobility    Modified Rankin (Stroke Patients Only)       Balance Overall balance assessment: Needs assistance Sitting-balance support: Feet supported;No upper extremity supported Sitting balance-Leahy Scale: Fair Sitting balance - Comments: fair/good seated at EOB   Standing balance support: During functional activity;Bilateral upper extremity supported Standing balance-Leahy Scale: Fair Standing balance comment: using RW                             Pertinent Vitals/Pain Pain Assessment: No/denies pain    Home Living Family/patient expects to be discharged to:: Private residence Living Arrangements: Alone;Non-relatives/Friends Available Help at Discharge: Friend(s);Available PRN/intermittently Type of Home: Apartment Home Access: Level entry     Home Layout: One level Home Equipment: Walker - 4 wheels;Shower seat;Bedside commode;Grab bars - tub/shower      Prior Function Level of Independence: Needs assistance   Gait / Transfers Assistance Needed: household ambulator using Rollator  ADL's / Homemaking Assistance Needed: assisted by friends        Hand Dominance  Extremity/Trunk Assessment   Upper Extremity Assessment Upper Extremity Assessment: Generalized weakness    Lower Extremity Assessment Lower Extremity Assessment: Generalized weakness    Cervical / Trunk Assessment Cervical / Trunk Assessment: Normal   Communication   Communication: No difficulties  Cognition Arousal/Alertness: Awake/alert Behavior During Therapy: WFL for tasks assessed/performed Overall Cognitive Status: Within Functional Limits for tasks assessed                                        General Comments      Exercises     Assessment/Plan    PT Assessment Patient needs continued PT services  PT Problem List Decreased strength;Decreased activity tolerance;Decreased balance;Decreased mobility       PT Treatment Interventions DME instruction;Gait training;Stair training;Functional mobility training;Therapeutic activities;Therapeutic exercise;Balance training;Patient/family education    PT Goals (Current goals can be found in the Care Plan section)  Acute Rehab PT Goals Patient Stated Goal: return home after rehab PT Goal Formulation: With patient Time For Goal Achievement: 05/26/21 Potential to Achieve Goals: Good    Frequency Min 3X/week   Barriers to discharge        Co-evaluation               AM-PAC PT "6 Clicks" Mobility  Outcome Measure Help needed turning from your back to your side while in a flat bed without using bedrails?: A Little Help needed moving from lying on your back to sitting on the side of a flat bed without using bedrails?: A Lot Help needed moving to and from a bed to a chair (including a wheelchair)?: A Little Help needed standing up from a chair using your arms (e.g., wheelchair or bedside chair)?: A Little Help needed to walk in hospital room?: A Lot Help needed climbing 3-5 steps with a railing? : A Lot 6 Click Score: 15    End of Session Equipment Utilized During Treatment: Oxygen Activity Tolerance: Patient tolerated treatment well;Patient limited by fatigue Patient left: in chair;with call bell/phone within reach Nurse Communication: Mobility status PT Visit Diagnosis: Unsteadiness on feet (R26.81);Other abnormalities of gait and mobility  (R26.89);Muscle weakness (generalized) (M62.81)    Time: [id number] PT Time Calculation (min) (ACUTE ONLY): 30 min   Charges:   PT Evaluation $PT Eval Moderate Complexity: 1 Mod PT Treatments $Therapeutic Activity: 23-37 mins        9:50 AM, 05/12/21 Lonell Grandchild, MPT Physical Therapist with [facility] [phone] office [phone] mobile phone

## 2021-05-12 NOTE — Plan of Care (Signed)
  Problem: Acute Rehab PT Goals(only PT should resolve) Goal: Pt Will Go Supine/Side To Sit Outcome: Progressing Flowsheets (Taken 05/12/2021 0954) Pt will go Supine/Side to Sit:  with minimal assist  with min guard assist Goal: Patient Will Transfer Sit To/From Stand Outcome: Progressing Flowsheets (Taken 05/12/2021 0954) Patient will transfer sit to/from stand: with min guard assist Goal: Pt Will Transfer Bed To Chair/Chair To Bed Outcome: Progressing Flowsheets (Taken 05/12/2021 0954) Pt will Transfer Bed to Chair/Chair to Bed: min guard assist Goal: Pt Will Ambulate Outcome: Progressing Flowsheets (Taken 05/12/2021 0954) Pt will Ambulate:  25 feet  with minimal assist  with rolling walker   9:55 AM, 05/12/21 Lonell Grandchild, MPT Physical Therapist with [facility] [phone] office [phone] mobile phone

## 2021-05-12 NOTE — NC FL2 (Signed)
[place] LEVEL OF CARE SCREENING TOOL     IDENTIFICATION  Patient Name: Meagan Holt Birthdate: 10-19-1940 Sex: female Admission Date (Current Location): 05/11/2021  [facility] and [place] Number:  [employer] and Address:  [facility] [address] [place]      Provider Number: [id number]  Attending Physician Name and Address:  Cherene Altes, MD  Relative Name and Phone Number:       Current Level of Care: Hospital Recommended Level of Care: [place] Prior Approval Number:    Date Approved/Denied:   PASRR Number: [id number]  Discharge Plan: SNF    Current Diagnoses: Patient Active Problem List   Diagnosis Date Noted   COPD with acute exacerbation ([place]) 05/11/2021   Other hyperlipidemia 06/27/2020   Recurrent major depressive disorder, in partial remission ([place]) 06/27/2020   Acute encephalopathy 04/26/2020   AMS (altered mental status) 04/25/2020   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/19/2020   Severe sepsis due to bilateral pneumonia with acute on chronic hypoxic respiratory failure 04/18/2020   CAP (community acquired pneumonia) 04/14/2020   Acute on chronic respiratory failure with hypoxia ([place]) 04/14/2020   Recurrent falls 04/14/2020   Atrial fibrillation with RVR ([place]) 04/14/2020   Leukocytosis 04/14/2020   Obesity (BMI 30.0-34.9) 04/14/2020   Benign paroxysmal positional vertigo 09/08/2018   Cellulitis of right lower leg 09/08/2018   Orthostatic hypotension 05/22/2017   Syncope and collapse 05/21/2017   Chronic pain syndrome 05/21/2017   Altered mental status 05/20/2017   GERD (gastroesophageal reflux disease) 05/20/2017   Depression 05/20/2017   History of stroke 05/20/2017   Premature beats 05/20/2017   Cervical spondylosis without myelopathy 05/15/2017   Lumbar spondylosis 05/15/2017   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   Decreased range of motion of wrist  11/28/2016   Anxiety 05/10/2016   Head injury 07/30/2013   Difficulty walking 07/30/2013   COPD (chronic obstructive pulmonary disease) ([place]) 07/30/2013    Orientation RESPIRATION BLADDER Height & Weight     Self, Time, Situation, Place  O2 Incontinent, External catheter Weight:   Height:     BEHAVIORAL SYMPTOMS/MOOD NEUROLOGICAL BOWEL NUTRITION STATUS      Continent Diet (See d/c summary)  AMBULATORY STATUS COMMUNICATION OF NEEDS Skin   Extensive Assist Verbally Bruising                       Personal Care Assistance Level of Assistance  Bathing, Feeding, Dressing, Total care Bathing Assistance: Limited assistance Feeding assistance: Independent Dressing Assistance: Limited assistance Total Care Assistance: Limited assistance   Functional Limitations Info  Sight, Hearing, Speech Sight Info: Adequate Hearing Info: Adequate Speech Info: Adequate    SPECIAL CARE FACTORS FREQUENCY  PT (By licensed PT), OT (By licensed OT)     PT Frequency: 5 times weekly OT Frequency: 5 times weekly            Contractures Contractures Info: Not present    Additional Factors Info  Code Status, Allergies Code Status Info: FULL Allergies Info: Latex and doxycycline           Current Medications (05/12/2021):  This is the current hospital active medication list Current Facility-Administered Medications  Medication Dose Route Frequency Provider Last Rate Last Admin   acetaminophen (TYLENOL) tablet 650 mg  650 mg Oral Q6H PRN Emokpae, Ejiroghene E, MD       azithromycin (ZITHROMAX) 500 mg in sodium chloride 0.9 %  250 mL IVPB  500 mg Intravenous Q24H Emokpae, Ejiroghene E, MD   Stopped at 05/11/21 2346   guaiFENesin-dextromethorphan (ROBITUSSIN DM) 100-10 MG/5ML syrup 10 mL  10 mL Oral Q8H Emokpae, Ejiroghene E, MD   10 mL at 05/12/21 0905   ipratropium-albuterol (DUONEB) 0.5-2.5 (3) MG/3ML nebulizer solution 3 mL  3 mL Nebulization Q6H Emokpae, Ejiroghene E, MD   3 mL at 05/12/21  1353   ipratropium-albuterol (DUONEB) 0.5-2.5 (3) MG/3ML nebulizer solution 3 mL  3 mL Nebulization Q4H PRN Emokpae, Ejiroghene E, MD       metoprolol succinate (TOPROL-XL) 24 hr tablet 12.5 mg  12.5 mg Oral Daily Emokpae, Ejiroghene E, MD   12.5 mg at 05/12/21 0904   ondansetron (ZOFRAN) tablet 4 mg  4 mg Oral Q6H PRN Emokpae, Ejiroghene E, MD       Or   ondansetron (ZOFRAN) injection 4 mg  4 mg Intravenous Q6H PRN Emokpae, Ejiroghene E, MD       polyethylene glycol (MIRALAX / GLYCOLAX) packet 17 g  17 g Oral Daily PRN Emokpae, Ejiroghene E, MD       rivaroxaban (XARELTO) tablet 20 mg  20 mg Oral Daily Emokpae, Ejiroghene E, MD   20 mg at 05/12/21 0904   Current Outpatient Medications  Medication Sig Dispense Refill   albuterol (VENTOLIN HFA) 108 (90 Base) MCG/ACT inhaler Inhale 2 puffs into the lungs every 6 (six) hours as needed for wheezing or shortness of breath. 18 g 1   buPROPion (WELLBUTRIN XL) 300 MG 24 hr tablet Take 300 mg by mouth at bedtime.      furosemide (LASIX) 20 MG tablet Take 20 mg by mouth daily as needed for fluid or edema.     gabapentin (NEURONTIN) 600 MG tablet Take 600 mg by mouth 3 (three) times daily.     HYDROcodone Bitartrate ER 30 MG T24A Take 30 mg by mouth daily.     HYDROcodone-acetaminophen (NORCO) 10-325 MG tablet Take 1 tablet by mouth 3 (three) times daily as needed. (Patient taking differently: Take 1 tablet by mouth 3 (three) times daily as needed for moderate pain.) 30 tablet 0   metoprolol succinate (TOPROL-XL) 25 MG 24 hr tablet Take 12.5 mg by mouth daily.     Multiple Vitamin (MULTIVITAMIN WITH MINERALS) TABS tablet Take 1 tablet by mouth daily.     Multiple Vitamins-Minerals (ICAPS PO) Take 1 tablet by mouth daily.     Omega-3 Fatty Acids (FISH OIL) 1000 MG CAPS Take 3 capsules by mouth daily.     Polyethyl Glycol-Propyl Glycol 0.4-0.3 % SOLN Apply 1-2 drops to eye daily as needed (for dry eye relief).     XARELTO 20 MG TABS tablet Take 1 tablet by  mouth every evening.     zolpidem (AMBIEN) 5 MG tablet Take 1 tablet (5 mg total) by mouth at bedtime as needed for sleep. 30 tablet 0   ADVAIR DISKUS 250-50 MCG/DOSE AEPB Inhale 1 puff into the lungs 2 (two) times daily. (Patient not taking: Reported on 05/12/2021) 60 each 2   aspirin EC 325 MG tablet Take 1 tablet (325 mg total) by mouth daily with breakfast. (Patient not taking: Reported on 05/12/2021) 30 tablet 3   ipratropium-albuterol (DUONEB) 0.5-2.5 (3) MG/3ML SOLN Take 3 mLs by nebulization 3 (three) times daily. (Patient not taking: Reported on 05/12/2021) 360 mL      Discharge Medications: Please see discharge summary for a list of discharge medications.  Relevant Imaging Results:  Relevant  Lab Results:   Additional Information Pt SSN: [id number]  Iona Beard, [place]

## 2021-05-12 NOTE — ED Notes (Signed)
Patients bed soiled, cleaned with new bedding and gown, pur wick changed and replaced. CSW @ bedside @ this time.

## 2021-05-12 NOTE — ED Notes (Addendum)
Pt assisted with lunch tray.   Pt states she does not want niece updated and that she will speak to her later. Niece= Dorann Lodge @ [phone]  Pt appreciate and comfortable at this time.

## 2021-05-12 NOTE — TOC Initial Note (Signed)
Transition of Care [place]) - Initial/Assessment Note    Patient Details  Name: Meagan Holt MRN: [id number] Date of Birth: [birth year]  [facility] CM/SW Contact:    Iona Beard, [place] Phone Number: 05/12/2021, 2:36 PM  Clinical Narrative:                 PT is recommending SNF for pt. CSW met with pt in room to complete assessment. Pt states that she lives alone and her neighbors/friends assist with her ADLs. Pt states she has not had HH in the past. Pt has a walker and a rollator to use in the home when needed. CSW spoke with pt about PT recommendation for SNF. Pt is agreeable but does not want referral sent to [place]. TOC to send out pt referral for SNF and update with bed offers.   Expected Discharge Plan: Skilled Nursing Facility Barriers to Discharge: Continued Medical Work up   Patient Goals and CMS Choice Patient states their goals for this hospitalization and ongoing recovery are:: Go to SNF CMS Medicare.gov Compare Post Acute Care list provided to:: Patient Choice offered to / list presented to : Patient  Expected Discharge Plan and Services Expected Discharge Plan: [place] In-house Referral: Clinical Social Work Discharge Planning Services: CM Consult Post Acute Care Choice: [place] Living arrangements for the past 2 months: Apartment                                      Prior Living Arrangements/Services Living arrangements for the past 2 months: Apartment Lives with:: Self Patient language and need for interpreter reviewed:: Yes Do you feel safe going back to the place where you live?: Yes      Need for Family Participation in Patient Care: Yes (Comment) Care giver support system in place?: Yes (comment) Current home services: DME Criminal Activity/Legal Involvement Pertinent to Current Situation/Hospitalization: No - Comment as needed  Activities of Daily Living      Permission  Sought/Granted                  Emotional Assessment Appearance:: Appears stated age Attitude/Demeanor/Rapport: Engaged Affect (typically observed): Accepting Orientation: : Oriented to Self, Oriented to Place, Oriented to  Time, Oriented to Situation Alcohol / Substance Use: Not Applicable Psych Involvement: No (comment)  Admission diagnosis:  COPD with acute exacerbation ([place]) [J44.1] Patient Active Problem List   Diagnosis Date Noted   COPD with acute exacerbation ([place]) 05/11/2021   Other hyperlipidemia 06/27/2020   Recurrent major depressive disorder, in partial remission ([place]) 06/27/2020   Acute encephalopathy 04/26/2020   AMS (altered mental status) 04/25/2020   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/19/2020   Severe sepsis due to bilateral pneumonia with acute on chronic hypoxic respiratory failure 04/18/2020   CAP (community acquired pneumonia) 04/14/2020   Acute on chronic respiratory failure with hypoxia ([place]) 04/14/2020   Recurrent falls 04/14/2020   Atrial fibrillation with RVR ([place]) 04/14/2020   Leukocytosis 04/14/2020   Obesity (BMI 30.0-34.9) 04/14/2020   Benign paroxysmal positional vertigo 09/08/2018   Cellulitis of right lower leg 09/08/2018   Orthostatic hypotension 05/22/2017   Syncope and collapse 05/21/2017   Chronic pain syndrome 05/21/2017   Altered mental status 05/20/2017   GERD (gastroesophageal reflux disease) 05/20/2017   Depression 05/20/2017   History of stroke 05/20/2017   Premature beats 05/20/2017   Cervical spondylosis without  myelopathy 05/15/2017   Lumbar spondylosis 05/15/2017   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   Decreased range of motion of wrist 11/28/2016   Anxiety 05/10/2016   Head injury 07/30/2013   Difficulty walking 07/30/2013   COPD (chronic obstructive pulmonary disease) ([place]) 07/30/2013   PCP:  [name], MD Pharmacy:   [facility], [address] [place] [address] Phone: [phone] Fax: [phone]     Social Determinants of Health (SDOH) Interventions    Readmission Risk Interventions Readmission Risk Prevention Plan 05/12/2021 04/16/2020  Medication Screening Complete Complete  Transportation Screening Complete Complete  Some recent data might be hidden

## 2021-05-12 NOTE — Progress Notes (Signed)
Meagan Holt  [id number] DOB: 05-Apr-1941 DOA: 05/11/2021 PCP: [name], MD    Brief Narrative:  80 year old with a history of COPD, atrial fibrillation, and depression who was brought to the ED via EMS with increasing difficulty breathing and worsening cough.  She was found by caregiver sitting in her own urine and feces severely weak and unable to ambulate.  Patient admitted she had not been eating for a few days and reported multiple loose stools over 2 days.  She lives alone.  EMS recorded O2 sats of 70% on room air on arrival.  Consultants:  None  Code Status: FULL CODE  Antimicrobials:  Azithromycin 9/7 >  DVT prophylaxis: Xarelto  Subjective: Afebrile.  Vital signs stable.  Requiring 4 L nasal cannula to keep saturations in mid 90s. Is a bit lethargic/looks tired. Denies cp, n/v, or abdom pain.   Assessment & Plan:  Acute COPD exacerbation Improving - will not start steroids - support w/ supplemental O2 and nebs   Pulmonary edema No evidence of CHF via TTE August 2021 - gently diurese - repeat TTE   Acute hypoxic respiratory failure Due to above issues - wean O2 as able   Diarrhea Does not appear to have recurred since admit - monitor   Chronic atrial fibrillation On Xarelto chronically -continue beta-blocker  Macrocytosis Check 123456 and folic acid levels   Family Communication:  Status is: Observation  The patient will require care spanning > 2 midnights and should be moved to inpatient because: Inpatient level of care appropriate due to severity of illness  Dispo: The patient is from: Home              Anticipated d/c is to: SNF              Patient currently is not medically stable to d/c.   Difficult to place patient No   Objective: Blood pressure 116/63, pulse (!) 109, temperature (!) 97.2 F (36.2 C), temperature source Oral, resp. rate (!) 25, SpO2 (!) 88 %.  Intake/Output Summary (Last 24 hours) at 05/12/2021 0906 Last data filed at  05/11/2021 2346 Gross per 24 hour  Intake 138.93 ml  Output --  Net 138.93 ml   There were no vitals filed for this visit.  Examination: General: No acute respiratory distress - lethargic Lungs: fine crackles B bases - no wheezing  Cardiovascular: Regular rate and rhythm without murmur gallop or rub normal S1 and S2 Abdomen: Nontender, nondistended, soft, bowel sounds positive, no rebound, no ascites, no appreciable mass Extremities: 1+ B LE edema   CBC: Recent Labs  Lab 05/11/21 1501 05/12/21 0424  WBC 13.4* 8.7  NEUTROABS 11.4*  --   HGB 11.6* 11.0*  HCT 37.2 37.3  MCV 99.2 103.0*  PLT 187 [id number]   Basic Metabolic Panel: Recent Labs  Lab 05/11/21 1501 05/12/21 0424  NA 136 137  K 4.0 3.8  CL 102 103  CO2 28 24  GLUCOSE 104* 118*  BUN 19 16  CREATININE 0.62 0.56  CALCIUM 8.4* 8.3*   GFR: CrCl cannot be calculated (Unknown ideal weight.).  Liver Function Tests: Recent Labs  Lab 05/11/21 1501  AST 17  ALT 18  ALKPHOS 91  BILITOT 1.4*  PROT 6.1*  ALBUMIN 2.7*    HbA1C: Hgb A1c MFr Bld  Date/Time Value Ref Range Status  07/31/2013 03:50 AM 5.8 (H) <5.7 % Final    Comment:    (NOTE)  According to the ADA Clinical Practice Recommendations for 2011, when HbA1c is used as a screening test:  >=6.5%   Diagnostic of Diabetes Mellitus           (if abnormal result is confirmed) 5.7-6.4%   Increased risk of developing Diabetes Mellitus References:Diagnosis and Classification of Diabetes Mellitus,Diabetes [id number] 1):S62-S69 and Standards of Medical Care in         Diabetes - 2011,Diabetes P3829181 (Suppl 1):S11-S61.    Recent Results (from the past 240 hour(s))  Resp Panel by RT-PCR (Flu A&B, Covid) Nasopharyngeal Swab     Status: None   Collection Time: 05/11/21  2:50 PM   Specimen: Nasopharyngeal Swab; Nasopharyngeal(NP) swabs in vial transport medium  Result Value Ref  Range Status   SARS Coronavirus 2 by RT PCR NEGATIVE NEGATIVE Final    Comment: (NOTE) SARS-CoV-2 target nucleic acids are NOT DETECTED.  The SARS-CoV-2 RNA is generally detectable in upper respiratory specimens during the acute phase of infection. The lowest concentration of SARS-CoV-2 viral copies this assay can detect is 138 copies/mL. A negative result does not preclude SARS-Cov-2 infection and should not be used as the sole basis for treatment or other patient management decisions. A negative result may occur with  improper specimen collection/handling, submission of specimen other than nasopharyngeal swab, presence of viral mutation(s) within the areas targeted by this assay, and inadequate number of viral copies(<138 copies/mL). A negative result must be combined with clinical observations, patient history, and epidemiological information. The expected result is Negative.  Fact Sheet for Patients:  EntrepreneurPulse.com.au  Fact Sheet for Healthcare Providers:  IncredibleEmployment.be  This test is no t yet approved or cleared by the [place] FDA and  has been authorized for detection and/or diagnosis of SARS-CoV-2 by FDA under an Emergency Use Authorization (EUA). This EUA will remain  in effect (meaning this test can be used) for the duration of the COVID-19 declaration under Section 564(b)(1) of the Act, 21 U.S.C.section 360bbb-3(b)(1), unless the authorization is terminated  or revoked sooner.       Influenza A by PCR NEGATIVE NEGATIVE Final   Influenza B by PCR NEGATIVE NEGATIVE Final    Comment: (NOTE) The Xpert Xpress SARS-CoV-2/FLU/RSV plus assay is intended as an aid in the diagnosis of influenza from Nasopharyngeal swab specimens and should not be used as a sole basis for treatment. Nasal washings and aspirates are unacceptable for Xpert Xpress SARS-CoV-2/FLU/RSV testing.  Fact Sheet for  Patients: EntrepreneurPulse.com.au  Fact Sheet for Healthcare Providers: IncredibleEmployment.be  This test is not yet approved or cleared by the [place] FDA and has been authorized for detection and/or diagnosis of SARS-CoV-2 by FDA under an Emergency Use Authorization (EUA). This EUA will remain in effect (meaning this test can be used) for the duration of the COVID-19 declaration under Section 564(b)(1) of the Act, 21 U.S.C. section 360bbb-3(b)(1), unless the authorization is terminated or revoked.  Performed at [facility], [address], [place], [place] [address]      Scheduled Meds:  guaiFENesin-dextromethorphan  10 mL Oral Q8H   ipratropium-albuterol  3 mL Nebulization Q6H   metoprolol succinate  12.5 mg Oral Daily   rivaroxaban  20 mg Oral Daily   Continuous Infusions:  azithromycin Stopped (05/11/21 2346)     LOS: 0 days   Cherene Altes, MD [facility] Office  [phone] Pager - Text Page per Shea Evans  If 7PM-7AM, please contact night-coverage per Amion 05/12/2021, 9:06 AM

## 2021-05-12 NOTE — TOC Initial Note (Signed)
Transition of Care [facility]) - Initial/Assessment Note    Patient Details  Name: Meagan Holt MRN: [id number] Date of Birth: 1940-11-21  [facility]) CM/SW Contact:    Iona Beard, [place] Phone Number: 05/12/2021, 2:43 PM  Clinical Narrative:                 PT is recommending SNF for pt. CSW met with pt in room to complete assessment. Pt states that she lives alone and her neighbors/friends assist with her ADLs. Pt states she has not had HH in the past. Pt has a walker and a rollator to use in the home when needed. CSW spoke with pt about PT recommendation for SNF. Pt is agreeable but does not want referral sent to [place]. TOC to send out pt referral for SNF and update with bed offers.   Expected Discharge Plan: Skilled Nursing Facility Barriers to Discharge: Continued Medical Work up   Patient Goals and CMS Choice Patient states their goals for this hospitalization and ongoing recovery are:: Go to SNF CMS Medicare.gov Compare Post Acute Care list provided to:: Patient Choice offered to / list presented to : Patient  Expected Discharge Plan and Services Expected Discharge Plan: [place] In-house Referral: Clinical Social Work Discharge Planning Services: CM Consult Post Acute Care Choice: [place] Living arrangements for the past 2 months: Apartment                                      Prior Living Arrangements/Services Living arrangements for the past 2 months: Apartment Lives with:: Self Patient language and need for interpreter reviewed:: Yes Do you feel safe going back to the place where you live?: Yes      Need for Family Participation in Patient Care: Yes (Comment) Care giver support system in place?: Yes (comment) Current home services: DME Criminal Activity/Legal Involvement Pertinent to Current Situation/Hospitalization: No - Comment as needed  Activities of Daily Living      Permission  Sought/Granted                  Emotional Assessment Appearance:: Appears stated age Attitude/Demeanor/Rapport: Engaged Affect (typically observed): Accepting Orientation: : Oriented to Self, Oriented to Place, Oriented to  Time, Oriented to Situation Alcohol / Substance Use: Not Applicable Psych Involvement: No (comment)  Admission diagnosis:  COPD with acute exacerbation ([place]) [J44.1] Patient Active Problem List   Diagnosis Date Noted   COPD with acute exacerbation ([place]) 05/11/2021   Other hyperlipidemia 06/27/2020   Recurrent major depressive disorder, in partial remission ([place]) 06/27/2020   Acute encephalopathy 04/26/2020   AMS (altered mental status) 04/25/2020   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/19/2020   Severe sepsis due to bilateral pneumonia with acute on chronic hypoxic respiratory failure 04/18/2020   CAP (community acquired pneumonia) 04/14/2020   Acute on chronic respiratory failure with hypoxia ([place]) 04/14/2020   Recurrent falls 04/14/2020   Atrial fibrillation with RVR ([place]) 04/14/2020   Leukocytosis 04/14/2020   Obesity (BMI 30.0-34.9) 04/14/2020   Benign paroxysmal positional vertigo 09/08/2018   Cellulitis of right lower leg 09/08/2018   Orthostatic hypotension 05/22/2017   Syncope and collapse 05/21/2017   Chronic pain syndrome 05/21/2017   Altered mental status 05/20/2017   GERD (gastroesophageal reflux disease) 05/20/2017   Depression 05/20/2017   History of stroke 05/20/2017   Premature beats 05/20/2017   Cervical spondylosis without  myelopathy 05/15/2017   Lumbar spondylosis 05/15/2017   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   Decreased range of motion of wrist 11/28/2016   Anxiety 05/10/2016   Head injury 07/30/2013   Difficulty walking 07/30/2013   COPD (chronic obstructive pulmonary disease) ([place]) 07/30/2013   PCP:  [name], MD Pharmacy:   [facility], [address] [place] [address] Phone: [phone] Fax: [phone]     Social Determinants of Health (SDOH) Interventions    Readmission Risk Interventions Readmission Risk Prevention Plan 05/12/2021 04/16/2020  Medication Screening Complete Complete  Transportation Screening Complete Complete  Some recent data might be hidden

## 2021-05-13 DIAGNOSIS — J441 Chronic obstructive pulmonary disease with (acute) exacerbation: Secondary | ICD-10-CM | POA: Diagnosis not present

## 2021-05-13 LAB — GLUCOSE, CAPILLARY
Glucose-Capillary: 110 mg/dL — ABNORMAL HIGH (ref 70–99)
Glucose-Capillary: 117 mg/dL — ABNORMAL HIGH (ref 70–99)
Glucose-Capillary: 113 mg/dL — ABNORMAL HIGH (ref 70–99)
Glucose-Capillary: 137 mg/dL — ABNORMAL HIGH (ref 70–99)

## 2021-05-13 LAB — IRON AND TIBC
Iron: 16 ug/dL — ABNORMAL LOW (ref 28–170)
Saturation Ratios: 5 % — ABNORMAL LOW (ref 10.4–31.8)
TIBC: 338 ug/dL (ref 250–450)
UIBC: 322 ug/dL

## 2021-05-13 LAB — BASIC METABOLIC PANEL
Anion gap: 8 (ref 5–15)
BUN: 18 mg/dL (ref 8–23)
CO2: 34 mmol/L — ABNORMAL HIGH (ref 22–32)
Calcium: 8.5 mg/dL — ABNORMAL LOW (ref 8.9–10.3)
Chloride: 96 mmol/L — ABNORMAL LOW (ref 98–111)
Creatinine, Ser: 0.64 mg/dL (ref 0.44–1.00)
GFR, Estimated: >60 mL/min (ref >60)
Glucose, Bld: 121 mg/dL — ABNORMAL HIGH (ref 70–99)
Potassium: 3.5 mmol/L (ref 3.5–5.1)
Sodium: 138 mmol/L (ref 135–145)

## 2021-05-13 LAB — FOLATE: Folate: 16.4 ng/mL (ref >5.9)

## 2021-05-13 LAB — VITAMIN B12: Vitamin B-12: 356 pg/mL (ref 180–914)

## 2021-05-13 LAB — FERRITIN: Ferritin: 48 ng/mL (ref 11–307)

## 2021-05-13 LAB — RETICULOCYTES
Immature Retic Fract: 30.7 % — ABNORMAL HIGH (ref 2.3–15.9)
RBC.: 4.06 MIL/uL (ref 3.87–5.11)
Retic Count, Absolute: 94.2 10*3/uL (ref 19.0–186.0)
Retic Ct Pct: 2.3 % (ref 0.4–3.1)

## 2021-05-13 MED ORDER — FUROSEMIDE 10 MG/ML IJ SOLN
40.0000 mg | Freq: Two times a day (BID) | INTRAMUSCULAR | Status: DC
  Administered 2021-05-13 – 2021-05-16 (×6): 40 mg via INTRAVENOUS
  Filled 2021-05-13 (×6): qty 4

## 2021-05-13 MED ORDER — SODIUM CHLORIDE 0.9 % IV SOLN
250.0000 mg | Freq: Once | INTRAVENOUS | Status: AC
  Administered 2021-05-13: 250 mg via INTRAVENOUS
  Filled 2021-05-13: qty 20

## 2021-05-13 MED ORDER — ENSURE ENLIVE PO LIQD
237.0000 mL | Freq: Two times a day (BID) | ORAL | Status: DC
  Administered 2021-05-14 – 2021-05-24 (×16): 237 mL via ORAL

## 2021-05-13 MED ORDER — FUROSEMIDE 10 MG/ML IJ SOLN
40.0000 mg | Freq: Once | INTRAMUSCULAR | Status: AC
  Administered 2021-05-13: 40 mg via INTRAVENOUS
  Filled 2021-05-13: qty 4

## 2021-05-13 MED ORDER — CYANOCOBALAMIN 1000 MCG/ML IJ SOLN
1000.0000 ug | Freq: Once | INTRAMUSCULAR | Status: AC
  Administered 2021-05-13: 1000 ug via SUBCUTANEOUS
  Filled 2021-05-13: qty 1

## 2021-05-13 MED ORDER — POTASSIUM CHLORIDE CRYS ER 20 MEQ PO TBCR
40.0000 meq | EXTENDED_RELEASE_TABLET | Freq: Once | ORAL | Status: AC
  Administered 2021-05-13: 40 meq via ORAL
  Filled 2021-05-13: qty 2

## 2021-05-13 MED ORDER — METHYLPREDNISOLONE SODIUM SUCC 40 MG IJ SOLR
40.0000 mg | Freq: Once | INTRAMUSCULAR | Status: AC
  Administered 2021-05-13: 40 mg via INTRAVENOUS
  Filled 2021-05-13: qty 1

## 2021-05-13 NOTE — TOC Progression Note (Addendum)
Transition of Care [facility]) - Progression Note    Patient Details  Name: Meagan Holt MRN: [id number] Date of Birth: [birth year]  Transition of Care [facility]) CM/SW [place], [place] Phone Number: 05/13/2021, 1:55 PM  Clinical Narrative:    CSW spoke with Tammy at [facility] who stated that they do not currently have staffing to take new patients. CSW updated this in the [place]. CSW spoke to [facility] who does not have any beds until next Tuesday. CSW spoke with Tressa Busman at [facility] who does have beds and will review pts referral.   CSW spoke with pt about bed offers. Pt would like to accept bed at [facility]. CSW spoke with Tressa Busman who states they can accept pt. CSW updated by Attending MD that pt is not ready for d/c today. CSW updated MD that pt will need a COVID test completed the day before transition to SNF. CSW updated Tressa Busman and [facility] will follow.   Expected Discharge Plan: [place] Barriers to Discharge: Continued Medical Work up  Expected Discharge Plan and Services Expected Discharge Plan: [place] In-house Referral: Clinical Social Work Discharge Planning Services: CM Consult Post Acute Care Choice: [place] Living arrangements for the past 2 months: Apartment                                       Social Determinants of Health (SDOH) Interventions    Readmission Risk Interventions Readmission Risk Prevention Plan 05/12/2021 04/16/2020  Medication Screening Complete Complete  Transportation Screening Complete Complete  Some recent data might be hidden

## 2021-05-13 NOTE — Progress Notes (Signed)
Nutrition Brief Note  Patient identified on the Malnutrition Screening Tool (MST) Report  Per MD: 80 year old with a history of COPD, atrial fibrillation, and depression who was brought to the ED via EMS with increasing difficulty breathing and worsening cough.  She was found by caregiver sitting in her own urine and feces severely weak and unable to ambulate.  Patient admitted she had not been eating for a few days and reported multiple loose stools over 2 days.  She lives alone.  EMS recorded O2 sats of 70% on room air on arrival.  Wt Readings from Last 15 Encounters:  05/12/21 90.7 kg  12/11/20 89.8 kg  10/26/20 89.8 kg  08/18/20 91.6 kg  04/25/20 99.8 kg  04/14/20 99.8 kg  04/15/19 82.6 kg  03/25/19 81.6 kg  01/30/19 80.7 kg  08/08/18 87.1 kg  05/20/17 88.5 kg  11/07/16 91.2 kg  07/30/13 113.9 kg  Patient without significant weight change.   Body mass index is 32.27 kg/m. Patient meets criteria for obese based on current BMI.   Current diet order is regular, patient is consuming approximately 40% of meals at this time. Able to feed herself. Will add ONS to support increased energy and protein intake.  Last BM 9/6.    Labs and medications reviewed.  BMP Latest Ref Rng & Units 05/13/2021 05/12/2021 05/11/2021  Glucose 70 - 99 mg/dL 121(H) 118(H) 104(H)  BUN 8 - 23 mg/dL '18 16 19  '$ Creatinine 0.44 - 1.00 mg/dL 0.64 0.56 0.62  Sodium 135 - 145 mmol/L 138 137 136  Potassium 3.5 - 5.1 mmol/L 3.5 3.8 4.0  Chloride 98 - 111 mmol/L 96(L) 103 102  CO2 22 - 32 mmol/L 34(H) 24 28  Calcium 8.9 - 10.3 mg/dL 8.5(L) 8.3(L) 8.4(L)     No nutrition interventions warranted at this time. If nutrition issues arise, please consult RD.   Colman Cater MS,RD,CSG,LDN Contact: Shea Evans

## 2021-05-13 NOTE — Progress Notes (Signed)
Meagan Holt  [id number] DOB: [birth year] DOA: 05/11/2021 PCP: [name], MD    Brief Narrative:  80 year old with a history of COPD, atrial fibrillation, and depression who was brought to the ED via EMS with increasing difficulty breathing and worsening cough.  She was found by caregiver sitting in her own urine and feces severely weak and unable to ambulate.  Patient admitted she had not been eating for a few days and reported multiple loose stools over 2 days.  She lives alone.  EMS recorded O2 sats of 70% on room air on arrival.  Consultants:  None  Code Status: FULL CODE  Antimicrobials:  Azithromycin 9/7 >  DVT prophylaxis: Xarelto  Subjective: Afebrile. VSS. Sats 94% on 3LPM [place]. Appears to be in mild resp distress at time of my visit. Awake. Denies pain. Admits to sob. No abdom pain. Not in extremis, but not at baseline resp function either.   Assessment & Plan:  Acute COPD exacerbation Improving - will not start steroids - support w/ supplemental O2 and nebs   Pulmonary edema Appears to be the primary issue today - no evidence of CHF via TTE August 2021 - gently diurese - repeat TTE this admit notes EF 55-60% - suspect edema due to poor nutrition/low albumin - cont to diurese as BP and renal fxn will allow - f/u CXR in AM   Acute hypoxic respiratory failure Due to above issues - wean O2 as able   Diarrhea Does not appear to have recurred since admit - monitor   Chronic atrial fibrillation On Xarelto chronically -continue beta-blocker - rate controlled   Macrocytosis B12 marginal at 356 therefore will supplement - folate normal   Iron deficiency anemia  Fe quite low at 16 - likely nutritional in etiology - supplement via IV insfusion    Family Communication:  Status is: Inpatient  Remains inpatient appropriate because:Inpatient level of care appropriate due to severity of illness  Dispo: The patient is from: Home              Anticipated d/c is to:  SNF              Patient currently is not medically stable to d/c.   Difficult to place patient No   Objective: Blood pressure 127/87, pulse 77, temperature 98.6 F (37 C), temperature source Oral, resp. rate 18, weight 90.7 kg, SpO2 98 %.  Intake/Output Summary (Last 24 hours) at 05/13/2021 1015 Last data filed at 05/12/2021 1814 Gross per 24 hour  Intake 120 ml  Output --  Net 120 ml    Filed Weights   05/12/21 1600  Weight: 90.7 kg    Examination: General: more alert today - mild resp distress - not in extremis  Lungs: fine crackles B to mid lung fields B  Cardiovascular: Regular rate w/o M or rub  Abdomen: Nontender, nondistended, soft, bowel sounds positive Extremities: 1+ B LE edema   CBC: Recent Labs  Lab 05/11/21 1501 05/12/21 0424  WBC 13.4* 8.7  NEUTROABS 11.4*  --   HGB 11.6* 11.0*  HCT 37.2 37.3  MCV 99.2 103.0*  PLT 187 [id number]    Basic Metabolic Panel: Recent Labs  Lab 05/11/21 1501 05/12/21 0424 05/13/21 0614  NA 136 137 138  K 4.0 3.8 3.5  CL 102 103 96*  CO2 28 24 34*  GLUCOSE 104* 118* 121*  BUN '19 16 18  '$ CREATININE 0.62 0.56 0.64  CALCIUM 8.4* 8.3* 8.5*  GFR: Estimated Creatinine Clearance: 61.5 mL/min (by C-G formula based on SCr of 0.64 mg/dL).  Liver Function Tests: Recent Labs  Lab 05/11/21 1501  AST 17  ALT 18  ALKPHOS 91  BILITOT 1.4*  PROT 6.1*  ALBUMIN 2.7*     HbA1C: Hgb A1c MFr Bld  Date/Time Value Ref Range Status  07/31/2013 03:50 AM 5.8 (H) <5.7 % Final    Comment:    (NOTE)                                                                       According to the ADA Clinical Practice Recommendations for 2011, when HbA1c is used as a screening test:  >=6.5%   Diagnostic of Diabetes Mellitus           (if abnormal result is confirmed) 5.7-6.4%   Increased risk of developing Diabetes Mellitus References:Diagnosis and Classification of Diabetes Mellitus,Diabetes [id number] 1):S62-S69 and Standards of  Medical Care in         Diabetes - 2011,Diabetes P3829181 (Suppl 1):S11-S61.    Recent Results (from the past 240 hour(s))  Resp Panel by RT-PCR (Flu A&B, Covid) Nasopharyngeal Swab     Status: None   Collection Time: 05/11/21  2:50 PM   Specimen: Nasopharyngeal Swab; Nasopharyngeal(NP) swabs in vial transport medium  Result Value Ref Range Status   SARS Coronavirus 2 by RT PCR NEGATIVE NEGATIVE Final    Comment: (NOTE) SARS-CoV-2 target nucleic acids are NOT DETECTED.  The SARS-CoV-2 RNA is generally detectable in upper respiratory specimens during the acute phase of infection. The lowest concentration of SARS-CoV-2 viral copies this assay can detect is 138 copies/mL. A negative result does not preclude SARS-Cov-2 infection and should not be used as the sole basis for treatment or other patient management decisions. A negative result may occur with  improper specimen collection/handling, submission of specimen other than nasopharyngeal swab, presence of viral mutation(s) within the areas targeted by this assay, and inadequate number of viral copies(<138 copies/mL). A negative result must be combined with clinical observations, patient history, and epidemiological information. The expected result is Negative.  Fact Sheet for Patients:  EntrepreneurPulse.com.au  Fact Sheet for Healthcare Providers:  IncredibleEmployment.be  This test is no t yet approved or cleared by the [place] FDA and  has been authorized for detection and/or diagnosis of SARS-CoV-2 by FDA under an Emergency Use Authorization (EUA). This EUA will remain  in effect (meaning this test can be used) for the duration of the COVID-19 declaration under Section 564(b)(1) of the Act, 21 U.S.C.section 360bbb-3(b)(1), unless the authorization is terminated  or revoked sooner.       Influenza A by PCR NEGATIVE NEGATIVE Final   Influenza B by PCR NEGATIVE NEGATIVE Final     Comment: (NOTE) The Xpert Xpress SARS-CoV-2/FLU/RSV plus assay is intended as an aid in the diagnosis of influenza from Nasopharyngeal swab specimens and should not be used as a sole basis for treatment. Nasal washings and aspirates are unacceptable for Xpert Xpress SARS-CoV-2/FLU/RSV testing.  Fact Sheet for Patients: EntrepreneurPulse.com.au  Fact Sheet for Healthcare Providers: IncredibleEmployment.be  This test is not yet approved or cleared by the [place] FDA and has been authorized for detection and/or diagnosis of SARS-CoV-2  by FDA under an Emergency Use Authorization (EUA). This EUA will remain in effect (meaning this test can be used) for the duration of the COVID-19 declaration under Section 564(b)(1) of the Act, 21 U.S.C. section 360bbb-3(b)(1), unless the authorization is terminated or revoked.  Performed at [facility], [address] [place], [place] [address]      Scheduled Meds:  feeding supplement  237 mL Oral BID BM   metoprolol succinate  12.5 mg Oral Daily   rivaroxaban  20 mg Oral Daily      LOS: 1 day   Cherene Altes, MD [facility] Office  [phone] Pager - Text Page per Shea Evans  If 7PM-7AM, please contact night-coverage per Amion 05/13/2021, 10:15 AM

## 2021-05-14 ENCOUNTER — Inpatient Hospital Stay (HOSPITAL_COMMUNITY): Payer: Medicare Other

## 2021-05-14 DIAGNOSIS — J441 Chronic obstructive pulmonary disease with (acute) exacerbation: Secondary | ICD-10-CM | POA: Diagnosis not present

## 2021-05-14 LAB — BASIC METABOLIC PANEL
Anion gap: 12 (ref 5–15)
BUN: 15 mg/dL (ref 8–23)
CO2: 38 mmol/L — ABNORMAL HIGH (ref 22–32)
Calcium: 8.3 mg/dL — ABNORMAL LOW (ref 8.9–10.3)
Chloride: 91 mmol/L — ABNORMAL LOW (ref 98–111)
Creatinine, Ser: 0.52 mg/dL (ref 0.44–1.00)
GFR, Estimated: >60 mL/min (ref >60)
Glucose, Bld: 108 mg/dL — ABNORMAL HIGH (ref 70–99)
Potassium: 3.3 mmol/L — ABNORMAL LOW (ref 3.5–5.1)
Sodium: 141 mmol/L (ref 135–145)

## 2021-05-14 LAB — GLUCOSE, CAPILLARY
Glucose-Capillary: 117 mg/dL — ABNORMAL HIGH (ref 70–99)
Glucose-Capillary: 119 mg/dL — ABNORMAL HIGH (ref 70–99)

## 2021-05-14 MED ORDER — POTASSIUM CHLORIDE CRYS ER 20 MEQ PO TBCR
20.0000 meq | EXTENDED_RELEASE_TABLET | Freq: Two times a day (BID) | ORAL | Status: DC
  Administered 2021-05-14 – 2021-05-16 (×5): 20 meq via ORAL
  Filled 2021-05-14 (×5): qty 1

## 2021-05-14 MED ORDER — UMECLIDINIUM BROMIDE 62.5 MCG/INH IN AEPB
1.0000 | INHALATION_SPRAY | Freq: Every day | RESPIRATORY_TRACT | Status: DC
  Administered 2021-05-15 – 2021-05-24 (×10): 1 via RESPIRATORY_TRACT
  Filled 2021-05-14 (×2): qty 7

## 2021-05-14 MED ORDER — ALBUTEROL SULFATE (2.5 MG/3ML) 0.083% IN NEBU
2.5000 mg | INHALATION_SOLUTION | RESPIRATORY_TRACT | Status: DC | PRN
  Administered 2021-05-14 – 2021-05-15 (×2): 2.5 mg via RESPIRATORY_TRACT
  Filled 2021-05-14 (×2): qty 3

## 2021-05-14 MED ORDER — MOMETASONE FURO-FORMOTEROL FUM 200-5 MCG/ACT IN AERO
2.0000 | INHALATION_SPRAY | Freq: Two times a day (BID) | RESPIRATORY_TRACT | Status: DC
  Administered 2021-05-15 – 2021-05-24 (×19): 2 via RESPIRATORY_TRACT
  Filled 2021-05-14 (×2): qty 8.8

## 2021-05-14 NOTE — Progress Notes (Signed)
Physical Therapy Treatment Patient Details Name: Meagan Holt MRN: [id number] DOB: 1941/07/06 Today's Date: 05/14/2021    History of Present Illness Meagan Holt is a 80 y.o. female with medical history significant for COPD, atrial fibrillation, depression.  Patient was brought to the ED via EMS with reports of increasing difficulty breathing, and worsening cough.  Patient reported generalized weakness, unable to ambulate and increased falls.  Patient's land lady checked on her and called EMS.  Patient was sitting in her own urine and feces.  She reports she did not hit her head when she fell.  Patient reports she has not been eating, and reports multiple loose stools over the past 2 days.  Patient lives alone.    PT Comments    Patient agreeable to therapy but with reports of pain throughout her body. Nursing present during treatment and bed linens noted to be soiled with urine and pillow with blood stain. Contusion noted on back of head, notified nursing who cleansed wound while patient performing sitting balance edge of bed. Patient able to stand with min guard assist but needed verbal and tactile cues to place hands on walker secondary to patient being legally blind. Able to stand for 1 minute while bed linens changed. Able to scoot up in bed on edge of bed and return to supine with supervision but required max assist x2 to transition further up in bed secondary to fatigue. Nursing still present at end of session.  Patient will continue to benefit from skilled physical therapy in hospital and recommended venue below to increase strength, balance, endurance for safe ADLs and gait.   Follow Up Recommendations  SNF     Equipment Recommendations  None recommended by PT    Recommendations for Other Services       Precautions / Restrictions Precautions Precautions: Fall;Other (comment) Precaution Comments: Patient legally blind    Mobility  Bed Mobility Overal bed mobility: Needs  Assistance Bed Mobility: Supine to Sit;Rolling Rolling: Supervision   Supine to sit: Min assist     General bed mobility comments: slow labored movement, verbal cues to for hand placement and to feel for floor    Transfers Overall transfer level: Needs assistance Equipment used: Rolling walker (2 wheeled) Transfers: Sit to/from Stand Sit to Stand: Min guard         General transfer comment: increased time, labored movement - verbal ceus for hand placement on walker  Ambulation/Gait                 Stairs             Wheelchair Mobility    Modified Rankin (Stroke Patients Only)       Balance Overall balance assessment: Needs assistance Sitting-balance support: Feet supported;No upper extremity supported Sitting balance-Leahy Scale: Fair Sitting balance - Comments: fair/good seated at EOB   Standing balance support: During functional activity;Bilateral upper extremity supported Standing balance-Leahy Scale: Fair Standing balance comment: using RW                            Cognition Arousal/Alertness: Awake/alert Behavior During Therapy: WFL for tasks assessed/performed Overall Cognitive Status: Within Functional Limits for tasks assessed                                        Exercises General Exercises - Lower Extremity  Ankle Circles/Pumps: AROM;Both;15 reps    General Comments        Pertinent Vitals/Pain Pain Assessment: Faces Faces Pain Scale: Hurts little more Pain Location: all over Pain Descriptors / Indicators: Aching Pain Intervention(s): Monitored during session    Home Living                      Prior Function            PT Goals (current goals can now be found in the care plan section)      Frequency    Min 3X/week      PT Plan Current plan remains appropriate    Co-evaluation              AM-PAC PT "6 Clicks" Mobility   Outcome Measure  Help needed turning from  your back to your side while in a flat bed without using bedrails?: A Little Help needed moving from lying on your back to sitting on the side of a flat bed without using bedrails?: A Lot Help needed moving to and from a bed to a chair (including a wheelchair)?: A Lot Help needed standing up from a chair using your arms (e.g., wheelchair or bedside chair)?: A Little Help needed to walk in hospital room?: A Lot Help needed climbing 3-5 steps with a railing? : A Lot 6 Click Score: 14    End of Session Equipment Utilized During Treatment: Oxygen Activity Tolerance: Patient tolerated treatment well;Patient limited by fatigue;Patient limited by pain Patient left: with call bell/phone within reach;in bed;with nursing/sitter in room Nurse Communication: Mobility status PT Visit Diagnosis: Unsteadiness on feet (R26.81);Other abnormalities of gait and mobility (R26.89);Muscle weakness (generalized) (M62.81)     Time: [id number] PT Time Calculation (min) (ACUTE ONLY): 20 min  Charges:  $Therapeutic Activity: 8-22 mins                     10:55 AM, 05/14/21 Jerene Pitch, DPT Physical Therapy with [facility]  [phone] office

## 2021-05-14 NOTE — Progress Notes (Signed)
Passed by pt's room to see blood on bed linens. Into room to evaluate to find that pt has removed her IV, her purewick and her O2 nasal cannula. Pt states, "I don't know what you're talking about. I'm sorry! What did I do?" SaO2 registers 78% off of O2. O2 at 3lpm [place] replaced on pt and pt encouraged to take good deep breaths. Pt then cleaned of blood and urine, bed linens changed. SaO2 up to 90-91% with O2 on. Pt continues with very congested breath sounds and weak, congested cough. Pt currently talking on phone with family member.

## 2021-05-14 NOTE — Progress Notes (Signed)
Meagan Holt  [id number] DOB: 1941/03/24 DOA: 05/11/2021 PCP: [name], MD    Brief Narrative:  80 year old with a history of COPD, atrial fibrillation, and depression who was brought to the ED via EMS with increasing difficulty breathing and worsening cough.  She was found by caregiver sitting in her own urine and feces severely weak and unable to ambulate.  Patient admitted she had not been eating for a few days and reported multiple loose stools over 2 days.  She lives alone.  EMS recorded O2 sats of 70% on room air on arrival.  Consultants:  None  Code Status: FULL CODE  Antimicrobials:  Azithromycin 9/7 >  DVT prophylaxis: Xarelto  Subjective: Sats 97% on 3L conventional [place]. No acute changes on CXR today as reviewed by this MD. Desaturates severely w/ slight motion or dislodgement of O2. Is confused and unable to provide a reliable hx.   Assessment & Plan:  Acute COPD exacerbation Only minimal wheezing on exam - do not feel this is a signif issue at this time    Pulmonary edema no evidence of CHF via TTE August 2021 - cont diuresing - repeat TTE this admit notes EF 55-60% - suspect edema due to poor nutrition/low albumin - cont to diurese as BP and renal fxn will allow - f/u CXR today w/o new findings   Acute hypoxic respiratory failure Due to above issues - wean O2 as able - suspect this represents a signif degree of severe baseline emphysema - plan to obtain CT chest in AM for better imaging of lung parenchyma   Mild hypkalemia Due to diuresis - supplement and follow - f/u Mg  Diarrhea Does not appear to have recurred since admit - monitor   Chronic atrial fibrillation On Xarelto chronically -continue beta-blocker - rate controlled   Macrocytosis B12 marginal at 356 therefore supplemented during this admit - folate normal   Iron deficiency anemia  Fe quite low at 16 - likely nutritional in etiology - supplemented via IV insfusion this admit    Family  Communication:  Status is: Inpatient  Remains inpatient appropriate because:Inpatient level of care appropriate due to severity of illness  Dispo: The patient is from: Home              Anticipated d/c is to: SNF              Patient currently is not medically stable to d/c.   Difficult to place patient No   Objective: Blood pressure 139/85, pulse 99, temperature 98.3 F (36.8 C), temperature source Oral, resp. rate 18, weight 90.7 kg, SpO2 97 %.  Intake/Output Summary (Last 24 hours) at 05/14/2021 0854 Last data filed at 05/14/2021 0500 Gross per 24 hour  Intake 80 ml  Output 2300 ml  Net -2220 ml    Filed Weights   05/12/21 1600  Weight: 90.7 kg    Examination: General: more alert today - mild resp distress but not in extremis  Lungs: fine crackles B to mid lung fields - mild exp wheezing  Cardiovascular: Regular rate w/o M \ Abdomen: Nontender, nondistended, soft, bowel sounds positive Extremities: 1+ B LE edema w/o cyanosis   CBC: Recent Labs  Lab 05/11/21 1501 05/12/21 0424  WBC 13.4* 8.7  NEUTROABS 11.4*  --   HGB 11.6* 11.0*  HCT 37.2 37.3  MCV 99.2 103.0*  PLT 187 [id number]    Basic Metabolic Panel: Recent Labs  Lab 05/12/21 0424 05/13/21 0614 05/14/21 [id number]  NA 137 138 141  K 3.8 3.5 3.3*  CL 103 96* 91*  CO2 24 34* 38*  GLUCOSE 118* 121* 108*  BUN '16 18 15  '$ CREATININE 0.56 0.64 0.52  CALCIUM 8.3* 8.5* 8.3*    GFR: Estimated Creatinine Clearance: 61.5 mL/min (by C-G formula based on SCr of 0.52 mg/dL).  Liver Function Tests: Recent Labs  Lab 05/11/21 1501  AST 17  ALT 18  ALKPHOS 91  BILITOT 1.4*  PROT 6.1*  ALBUMIN 2.7*     HbA1C: Hgb A1c MFr Bld  Date/Time Value Ref Range Status  07/31/2013 03:50 AM 5.8 (H) <5.7 % Final    Comment:    (NOTE)                                                                       According to the ADA Clinical Practice Recommendations for 2011, when HbA1c is used as a screening test:  >=6.5%    Diagnostic of Diabetes Mellitus           (if abnormal result is confirmed) 5.7-6.4%   Increased risk of developing Diabetes Mellitus References:Diagnosis and Classification of Diabetes Mellitus,Diabetes [id number] 1):S62-S69 and Standards of Medical Care in         Diabetes - 2011,Diabetes [id number] (Suppl 1):S11-S61.    Recent Results (from the past 240 hour(s))  Resp Panel by RT-PCR (Flu A&B, Covid) Nasopharyngeal Swab     Status: None   Collection Time: 05/11/21  2:50 PM   Specimen: Nasopharyngeal Swab; Nasopharyngeal(NP) swabs in vial transport medium  Result Value Ref Range Status   SARS Coronavirus 2 by RT PCR NEGATIVE NEGATIVE Final    Comment: (NOTE) SARS-CoV-2 target nucleic acids are NOT DETECTED.  The SARS-CoV-2 RNA is generally detectable in upper respiratory specimens during the acute phase of infection. The lowest concentration of SARS-CoV-2 viral copies this assay can detect is 138 copies/mL. A negative result does not preclude SARS-Cov-2 infection and should not be used as the sole basis for treatment or other patient management decisions. A negative result may occur with  improper specimen collection/handling, submission of specimen other than nasopharyngeal swab, presence of viral mutation(s) within the areas targeted by this assay, and inadequate number of viral copies(<138 copies/mL). A negative result must be combined with clinical observations, patient history, and epidemiological information. The expected result is Negative.  Fact Sheet for Patients:  EntrepreneurPulse.com.au  Fact Sheet for Healthcare Providers:  IncredibleEmployment.be  This test is no t yet approved or cleared by the [place] FDA and  has been authorized for detection and/or diagnosis of SARS-CoV-2 by FDA under an Emergency Use Authorization (EUA). This EUA will remain  in effect (meaning this test can be used) for the duration of  the COVID-19 declaration under Section 564(b)(1) of the Act, 21 U.S.C.section 360bbb-3(b)(1), unless the authorization is terminated  or revoked sooner.       Influenza A by PCR NEGATIVE NEGATIVE Final   Influenza B by PCR NEGATIVE NEGATIVE Final    Comment: (NOTE) The Xpert Xpress SARS-CoV-2/FLU/RSV plus assay is intended as an aid in the diagnosis of influenza from Nasopharyngeal swab specimens and should not be used as a sole basis for treatment. Nasal washings and aspirates are unacceptable  for Xpert Xpress SARS-CoV-2/FLU/RSV testing.  Fact Sheet for Patients: EntrepreneurPulse.com.au  Fact Sheet for Healthcare Providers: IncredibleEmployment.be  This test is not yet approved or cleared by the [place] FDA and has been authorized for detection and/or diagnosis of SARS-CoV-2 by FDA under an Emergency Use Authorization (EUA). This EUA will remain in effect (meaning this test can be used) for the duration of the COVID-19 declaration under Section 564(b)(1) of the Act, 21 U.S.C. section 360bbb-3(b)(1), unless the authorization is terminated or revoked.  Performed at [facility], [address], [place], [place] [address]      Scheduled Meds:  feeding supplement  237 mL Oral BID BM   furosemide  40 mg Intravenous Q12H   metoprolol succinate  12.5 mg Oral Daily   rivaroxaban  20 mg Oral Daily      LOS: 2 days   Cherene Altes, MD [facility] Office  [phone] Pager - Text Page per Shea Evans  If 7PM-7AM, please contact night-coverage per Amion 05/14/2021, 8:54 AM

## 2021-05-14 NOTE — Progress Notes (Signed)
PT in to work with patient. Pt assisted to standing position by PT using FWW. While pt standing, peri-care performed and bed linens changed. Pt back to seated position. VS obtained after 5 minutes of rest, with HR 119 bpm, b/p wnl. Pt with some dyspnea with standing, then resp back to 20 /min, pt encouraged to perform pursed-lipped breathing. Repeat VS taken after 10 minutes of rest show HR in 90's, resp 20/min, SaO2 94% on 3 lpm [place]. Scheduled lopressor administered.

## 2021-05-15 DIAGNOSIS — I482 Chronic atrial fibrillation, unspecified: Secondary | ICD-10-CM | POA: Diagnosis present

## 2021-05-15 DIAGNOSIS — J441 Chronic obstructive pulmonary disease with (acute) exacerbation: Secondary | ICD-10-CM | POA: Diagnosis not present

## 2021-05-15 DIAGNOSIS — D539 Nutritional anemia, unspecified: Secondary | ICD-10-CM | POA: Diagnosis present

## 2021-05-15 DIAGNOSIS — D509 Iron deficiency anemia, unspecified: Secondary | ICD-10-CM | POA: Diagnosis present

## 2021-05-15 DIAGNOSIS — J9621 Acute and chronic respiratory failure with hypoxia: Secondary | ICD-10-CM | POA: Diagnosis not present

## 2021-05-15 LAB — BASIC METABOLIC PANEL
Anion gap: 14 (ref 5–15)
BUN: 15 mg/dL (ref 8–23)
CO2: 42 mmol/L — ABNORMAL HIGH (ref 22–32)
Calcium: 8.7 mg/dL — ABNORMAL LOW (ref 8.9–10.3)
Chloride: 83 mmol/L — ABNORMAL LOW (ref 98–111)
Creatinine, Ser: 0.53 mg/dL (ref 0.44–1.00)
GFR, Estimated: >60 mL/min (ref >60)
Glucose, Bld: 93 mg/dL (ref 70–99)
Potassium: 3.1 mmol/L — ABNORMAL LOW (ref 3.5–5.1)
Sodium: 139 mmol/L (ref 135–145)

## 2021-05-15 LAB — MAGNESIUM: Magnesium: 1.4 mg/dL — ABNORMAL LOW (ref 1.7–2.4)

## 2021-05-15 MED ORDER — MAGNESIUM SULFATE 2 GM/50ML IV SOLN
2.0000 g | Freq: Once | INTRAVENOUS | Status: AC
  Administered 2021-05-15: 2 g via INTRAVENOUS
  Filled 2021-05-15: qty 50

## 2021-05-15 MED ORDER — FERROUS SULFATE 325 (65 FE) MG PO TABS
325.0000 mg | ORAL_TABLET | Freq: Every day | ORAL | Status: DC
  Administered 2021-05-15 – 2021-05-24 (×10): 325 mg via ORAL
  Filled 2021-05-15 (×10): qty 1

## 2021-05-15 MED ORDER — FERROUS SULFATE 325 (65 FE) MG PO TABS: 325.0000 mg | ORAL_TABLET | Freq: Every day | ORAL | Status: DC

## 2021-05-15 MED ORDER — ALBUTEROL SULFATE (2.5 MG/3ML) 0.083% IN NEBU
2.5000 mg | INHALATION_SOLUTION | Freq: Three times a day (TID) | RESPIRATORY_TRACT | Status: DC
  Administered 2021-05-16: 2.5 mg via RESPIRATORY_TRACT
  Filled 2021-05-15: qty 3

## 2021-05-15 NOTE — Progress Notes (Signed)
Progress Note    Meagan Holt   [id number]  DOB: [birth year]  DOA: 05/11/2021     3  PCP: [name], MD  Initial CC: cough, SOB  Hospital Course: 80 year old with a history of COPD, atrial fibrillation, and depression who was brought to the ED via EMS with increasing difficulty breathing and worsening cough.  She was found by caregiver sitting in her own urine and feces severely weak and unable to ambulate.  Patient admitted she had not been eating for a few days and reported multiple loose stools over 2 days.  She lives alone.  EMS recorded O2 sats of 70% on room air on arrival.  Interval History:  No events overnight.  States that her breathing feels improved compared to admission and she is coughing less and also less short of breath.  ROS: Constitutional: negative for chills, fatigue, and fevers, Respiratory: negative for sputum and wheezing, Cardiovascular: negative for chest pain, and Gastrointestinal: negative for abdominal pain  Assessment & Plan: Acute COPD exacerbation - initially treated with IV steroids on admission with good response; have since been discontinued - no further wheezing - continue current inhalers   Pulmonary edema no evidence of CHF via TTE August 2021 - cont diuresing - repeat TTE this admit notes EF 55-60% - suspect edema due to poor nutrition/low albumin  - cont to diurese as BP and renal fxn will allow   Acute on chronic hypoxic respiratory failure Due to above issues - wean O2 as able - suspect this represents a signif degree of severe baseline emphysema  - on 2L O2 at home chronically  - CT chest if no improvement or worsening    Hypokalemia - replete as needed   Diarrhea - resolved  Does not appear to have recurred since admit - monitor    Chronic atrial fibrillation On Xarelto chronically -continue beta-blocker - rate controlled    Macrocytic anemia - B12 marginal at 356 therefore supplemented during this admit  - folate  normal, 16.4   Iron deficiency anemia  - iron 16, sat ratio 5%, ferritin 48 - s/p IV iron on admission - repeat iron studies in 3-4 months  Old records reviewed in assessment of this patient  Antimicrobials:   DVT prophylaxis:  rivaroxaban (XARELTO) tablet 20 mg   Code Status:   Code Status: Full Code Family Communication:   Disposition Plan: Status is: Inpatient  Remains inpatient appropriate because:Inpatient level of care appropriate due to severity of illness  Dispo: The patient is from: Home              Anticipated d/c is to: Home              Patient currently is not medically stable to d/c.   Difficult to place patient No  Risk of unplanned readmission score: Unplanned Admission- Pilot do not use: 13.2   Objective: Blood pressure 131/86, pulse 95, temperature 98.7 F (37.1 C), temperature source Oral, resp. rate 18, weight 90.7 kg, SpO2 90 %.  Examination: General appearance: alert, cooperative, fatigued, and no distress Head: Normocephalic, without obvious abnormality, atraumatic Eyes:  EOMI Lungs:  good air movement; minimal wheezing Heart: S1, S2 normal Abdomen: normal findings: bowel sounds normal and soft, non-tender Extremities:  no edema Skin: mobility and turgor normal Neurologic: Grossly normal  Consultants:    Procedures:    Data Reviewed: I have personally reviewed following labs and imaging studies Results for orders placed or performed during the hospital encounter  of 05/11/21 (from the past 24 hour(s))  Magnesium     Status: Abnormal   Collection Time: 05/15/21  5:56 AM  Result Value Ref Range   Magnesium 1.4 (L) 1.7 - 2.4 mg/dL  Basic metabolic panel     Status: Abnormal   Collection Time: 05/15/21  5:56 AM  Result Value Ref Range   Sodium 139 135 - 145 mmol/L   Potassium 3.1 (L) 3.5 - 5.1 mmol/L   Chloride 83 (L) 98 - 111 mmol/L   CO2 42 (H) 22 - 32 mmol/L   Glucose, Bld 93 70 - 99 mg/dL   BUN 15 8 - 23 mg/dL   Creatinine, Ser  0.53 0.44 - 1.00 mg/dL   Calcium 8.7 (L) 8.9 - 10.3 mg/dL   GFR, Estimated >60 >60 mL/min   Anion gap 14 5 - 15    Recent Results (from the past 240 hour(s))  Resp Panel by RT-PCR (Flu A&B, Covid) Nasopharyngeal Swab     Status: None   Collection Time: 05/11/21  2:50 PM   Specimen: Nasopharyngeal Swab; Nasopharyngeal(NP) swabs in vial transport medium  Result Value Ref Range Status   SARS Coronavirus 2 by RT PCR NEGATIVE NEGATIVE Final    Comment: (NOTE) SARS-CoV-2 target nucleic acids are NOT DETECTED.  The SARS-CoV-2 RNA is generally detectable in upper respiratory specimens during the acute phase of infection. The lowest concentration of SARS-CoV-2 viral copies this assay can detect is 138 copies/mL. A negative result does not preclude SARS-Cov-2 infection and should not be used as the sole basis for treatment or other patient management decisions. A negative result may occur with  improper specimen collection/handling, submission of specimen other than nasopharyngeal swab, presence of viral mutation(s) within the areas targeted by this assay, and inadequate number of viral copies(<138 copies/mL). A negative result must be combined with clinical observations, patient history, and epidemiological information. The expected result is Negative.  Fact Sheet for Patients:  EntrepreneurPulse.com.au  Fact Sheet for Healthcare Providers:  IncredibleEmployment.be  This test is no t yet approved or cleared by the [place] FDA and  has been authorized for detection and/or diagnosis of SARS-CoV-2 by FDA under an Emergency Use Authorization (EUA). This EUA will remain  in effect (meaning this test can be used) for the duration of the COVID-19 declaration under Section 564(b)(1) of the Act, 21 U.S.C.section 360bbb-3(b)(1), unless the authorization is terminated  or revoked sooner.       Influenza A by PCR NEGATIVE NEGATIVE Final   Influenza B by  PCR NEGATIVE NEGATIVE Final    Comment: (NOTE) The Xpert Xpress SARS-CoV-2/FLU/RSV plus assay is intended as an aid in the diagnosis of influenza from Nasopharyngeal swab specimens and should not be used as a sole basis for treatment. Nasal washings and aspirates are unacceptable for Xpert Xpress SARS-CoV-2/FLU/RSV testing.  Fact Sheet for Patients: EntrepreneurPulse.com.au  Fact Sheet for Healthcare Providers: IncredibleEmployment.be  This test is not yet approved or cleared by the [place] FDA and has been authorized for detection and/or diagnosis of SARS-CoV-2 by FDA under an Emergency Use Authorization (EUA). This EUA will remain in effect (meaning this test can be used) for the duration of the COVID-19 declaration under Section 564(b)(1) of the Act, 21 U.S.C. section 360bbb-3(b)(1), unless the authorization is terminated or revoked.  Performed at [facility], [address] [place], [place] [address]      Radiology Studies: [facility] Chest [facility] 1 View  Result Date: 05/14/2021 CLINICAL DATA:  Pulmonary edema  and weakness.  COPD. EXAM: PORTABLE CHEST 1 VIEW COMPARISON:  05/11/2021 FINDINGS: Midline trachea. Mild cardiomegaly. No pleural effusion or pneumothorax. Moderate pulmonary interstitial thickening. No lobar consolidation. IMPRESSION: Cardiomegaly and moderate pulmonary interstitial thickening, felt to be similar. This could simply be secondary to smoking/chronic bronchitis. However, pulmonary venous congestion could look similar. No overt congestive failure. Electronically Signed   By: Abigail Miyamoto M.D.   On: 05/14/2021 08:19   DG Chest Port 1 View  Final Result    DG Chest Portable 1 View  Final Result      Scheduled Meds:  feeding supplement  237 mL Oral BID BM   ferrous sulfate  325 mg Oral Q breakfast   furosemide  40 mg Intravenous Q12H   metoprolol succinate  12.5 mg Oral Daily   mometasone-formoterol  2 puff Inhalation BID    potassium chloride  20 mEq Oral BID   rivaroxaban  20 mg Oral Daily   umeclidinium bromide  1 puff Inhalation Daily   PRN Meds: acetaminophen **OR** [DISCONTINUED] acetaminophen, albuterol, ondansetron **OR** ondansetron (ZOFRAN) IV, polyethylene glycol, zolpidem Continuous Infusions:   LOS: 3 days  Time spent: Greater than 50% of the 35 minute visit was spent in counseling/coordination of care for the patient as laid out in the A&P.   Dwyane Dee, MD [facility] 05/15/2021, 1:14 PM

## 2021-05-15 NOTE — Progress Notes (Signed)
MD request attempt to wean O2 to 2lpm [place] to keep SaO2 88% or greater. Current SaO2 90% on 3 lpm, rate decreased to 2.5 lpm [place] and will reevaluate pt in 15 minutes.

## 2021-05-15 NOTE — Progress Notes (Signed)
Pt's SaO2 on 2 lpm [place] is 89-90%. No resp difficulty noted.

## 2021-05-15 NOTE — Progress Notes (Signed)
In to reassess SaO2, pt noted with blood on gown and bed covers. Pt had pulled both protective overwrap and IV out. Bandage applied to IV site. Pt states, "I didn't do that. It just started getting wet." SaO2 checked and noted to be 90-91%. O2 decreased to 2 lpm and will reevaluate in 15 minutes.

## 2021-05-16 ENCOUNTER — Inpatient Hospital Stay (HOSPITAL_COMMUNITY): Payer: Medicare Other

## 2021-05-16 DIAGNOSIS — J441 Chronic obstructive pulmonary disease with (acute) exacerbation: Secondary | ICD-10-CM | POA: Diagnosis not present

## 2021-05-16 LAB — CBC WITH DIFFERENTIAL/PLATELET
Abs Immature Granulocytes: 0.12 10*3/uL — ABNORMAL HIGH (ref 0.00–0.07)
Basophils Absolute: 0.1 10*3/uL (ref 0.0–0.1)
Basophils Relative: 1 %
Eosinophils Absolute: 0 10*3/uL (ref 0.0–0.5)
Eosinophils Relative: 0 %
HCT: 48 % — ABNORMAL HIGH (ref 36.0–46.0)
Hemoglobin: 14.8 g/dL (ref 12.0–15.0)
Immature Granulocytes: 1 %
Lymphocytes Relative: 14 %
Lymphs Abs: 1.6 10*3/uL (ref 0.7–4.0)
MCH: 30.1 pg (ref 26.0–34.0)
MCHC: 30.8 g/dL (ref 30.0–36.0)
MCV: 97.8 fL (ref 80.0–100.0)
Monocytes Absolute: 1.2 10*3/uL — ABNORMAL HIGH (ref 0.1–1.0)
Monocytes Relative: 10 %
Neutro Abs: 8.7 10*3/uL — ABNORMAL HIGH (ref 1.7–7.7)
Neutrophils Relative %: 74 %
Platelets: 246 10*3/uL (ref 150–400)
RBC: 4.91 MIL/uL (ref 3.87–5.11)
RDW: 14.1 % (ref 11.5–15.5)
WBC: 11.7 10*3/uL — ABNORMAL HIGH (ref 4.0–10.5)
nRBC: 0 % (ref 0.0–0.2)

## 2021-05-16 LAB — BASIC METABOLIC PANEL
Anion gap: 13 (ref 5–15)
BUN: 16 mg/dL (ref 8–23)
CO2: 42 mmol/L — ABNORMAL HIGH (ref 22–32)
Calcium: 8.2 mg/dL — ABNORMAL LOW (ref 8.9–10.3)
Chloride: 79 mmol/L — ABNORMAL LOW (ref 98–111)
Creatinine, Ser: 0.59 mg/dL (ref 0.44–1.00)
GFR, Estimated: >60 mL/min (ref >60)
Glucose, Bld: 106 mg/dL — ABNORMAL HIGH (ref 70–99)
Potassium: 2.9 mmol/L — ABNORMAL LOW (ref 3.5–5.1)
Sodium: 134 mmol/L — ABNORMAL LOW (ref 135–145)

## 2021-05-16 LAB — MAGNESIUM: Magnesium: 1.6 mg/dL — ABNORMAL LOW (ref 1.7–2.4)

## 2021-05-16 MED ORDER — HYDROCODONE-ACETAMINOPHEN 10-325 MG PO TABS
1.0000 | ORAL_TABLET | Freq: Three times a day (TID) | ORAL | Status: DC | PRN
  Administered 2021-05-17 – 2021-05-24 (×14): 1 via ORAL
  Filled 2021-05-16 (×14): qty 1

## 2021-05-16 MED ORDER — FUROSEMIDE 20 MG PO TABS: 20.0000 mg | ORAL_TABLET | Freq: Every day | ORAL | Status: DC | PRN

## 2021-05-16 MED ORDER — ALBUTEROL SULFATE (2.5 MG/3ML) 0.083% IN NEBU
2.5000 mg | INHALATION_SOLUTION | Freq: Two times a day (BID) | RESPIRATORY_TRACT | Status: DC
  Administered 2021-05-16 – 2021-05-24 (×16): 2.5 mg via RESPIRATORY_TRACT
  Filled 2021-05-16 (×16): qty 3

## 2021-05-16 MED ORDER — GABAPENTIN 300 MG PO CAPS
600.0000 mg | ORAL_CAPSULE | Freq: Three times a day (TID) | ORAL | Status: DC
  Administered 2021-05-16 – 2021-05-18 (×8): 600 mg via ORAL
  Filled 2021-05-16 (×8): qty 2

## 2021-05-16 MED ORDER — BUPROPION HCL ER (XL) 300 MG PO TB24
300.0000 mg | ORAL_TABLET | Freq: Every day | ORAL | Status: DC
  Administered 2021-05-16 – 2021-05-23 (×8): 300 mg via ORAL
  Filled 2021-05-16: qty 1
  Filled 2021-05-16 (×2): qty 2
  Filled 2021-05-16 (×5): qty 1

## 2021-05-16 MED ORDER — HYDROCODONE BITARTRATE ER 30 MG PO T24A: 30.0000 mg | EXTENDED_RELEASE_TABLET | Freq: Every day | ORAL | Status: DC

## 2021-05-16 MED ORDER — POTASSIUM CHLORIDE CRYS ER 20 MEQ PO TBCR
40.0000 meq | EXTENDED_RELEASE_TABLET | Freq: Three times a day (TID) | ORAL | Status: AC
  Administered 2021-05-16 – 2021-05-17 (×4): 40 meq via ORAL
  Filled 2021-05-16 (×4): qty 2

## 2021-05-16 MED ORDER — MAGNESIUM SULFATE 2 GM/50ML IV SOLN
2.0000 g | Freq: Once | INTRAVENOUS | Status: AC
  Administered 2021-05-16: 2 g via INTRAVENOUS
  Filled 2021-05-16: qty 50

## 2021-05-16 NOTE — Progress Notes (Signed)
RT brought flutter to patient. However, patient is asleep at this time. RT will try later today.

## 2021-05-16 NOTE — Progress Notes (Signed)
PROGRESS NOTE    Meagan Holt  [id number] DOB: November 03, 1940 DOA: 05/11/2021 PCP: [name], MD    Brief Narrative:  80 year old female with history of COPD, chronic hypoxic respiratory failure on 2 L oxygen at home, chronic atrial fibrillation and depression, blindness brought to the emergency room when she was found by her caretaker sitting in her own urine and feces, extremely debilitated and unable to ambulate.  Patient herself reported that she had not been eating for few days and had multiple loose stools.  She lives at an apartment, independent living.  Uses 2 L oxygen at home.  EMS recorded oxygen of 70% on arrival but not using oxygen.  Admitted as COPD exacerbation, frailty and debility.   Assessment & Plan:   Principal Problem:   COPD with acute exacerbation ([place]) Active Problems:   Depression   History of stroke   Acute on chronic respiratory failure with hypoxia (HCC)   Recurrent falls   Atrial fibrillation, chronic (HCC)   IDA (iron deficiency anemia)   Macrocytic anemia  COPD with acute exacerbation: Treated with aggressive steroids and bronchodilator therapy.  Steroids discontinued.  Stabilizing. Continue bronchodilator therapy, chest physiotherapy and incentive spirometry.  Start flutter valve therapy today.  Keep on oxygen to keep saturations more than 90%. Empiric treatment with IV diuresis, severely depleted potassium.  Discontinue IV Lasix.  Change to oral Lasix as needed.  Echocardiogram with ejection fraction 55 to 60%. Patient continues to complain of shortness of breath despite prolonged treatment in the hospital.  We will check CT scan of the chest today without contrast to look for any pneumonia or complications.  She is already therapeutic on Xarelto.  Likelihood of pulmonary embolism is very low.  Hypokalemia and hypomagnesemia: Severe and persistent.  Aggressively replaced today and monitor levels.  Recheck levels tomorrow morning.  Chronic atrial  fibrillation: Rate controlled on beta-blockers.  She is on Xarelto and therapeutic.  Chronic iron deficiency anemia: Given IV iron in the hospital.  Delirium: Reported developed delirium while hospitalized.  No focal deficit.  Apparently patient on Wellbutrin, scheduled opiates and gabapentin. she will resume this to avoid withdrawals.  Physical debility and frailty: Work with PT OT.  Will benefit with rehab at a skilled nursing facility before returning home.   DVT prophylaxis:  rivaroxaban (XARELTO) tablet 20 mg   Code Status: Full code Family Communication: None Disposition Plan: Status is: Inpatient  Remains inpatient appropriate because:Inpatient level of care appropriate due to severity of illness  Dispo: The patient is from: Home              Anticipated d/c is to: SNF              Patient currently is not medically stable to d/c.   Difficult to place patient No         Consultants:  None  Procedures:  None  Antimicrobials:  None   Subjective: Patient seen and examined.  No overnight events.  Nursing reported that she had confusion at times and was pulling on lines and telemetry.  Patient herself looked quiet and comfortable on my exam.  She tells me that she has difficulty breathing.  No sputum production.  No fever.  Objective: Vitals:   05/15/21 2137 05/16/21 0447 05/16/21 0826 05/16/21 1002  BP: 122/74 122/60  134/83  Pulse: 100 62  60  Resp: 20 20    Temp: 98.8 F (37.1 C) 97.9 F (36.6 C)    TempSrc:  Oral Oral    SpO2: 91% 94% 96%   Weight:  78.4 kg      Intake/Output Summary (Last 24 hours) at 05/16/2021 1344 Last data filed at 05/16/2021 0700 Gross per 24 hour  Intake 130 ml  Output 2300 ml  Net -2170 ml   Filed Weights   05/12/21 1600 05/16/21 0447  Weight: 90.7 kg 78.4 kg    Examination:  General: Chronically sick looking.  Frail and debilitated.  Not in any obvious distress. Cardiovascular: S1-S2 normal.  Irregularly irregular.   No added sounds. Respiratory: Bilateral extensive conducted upper airway sounds.  Expiratory wheezes. Gastrointestinal: Soft.  Nontender.  Bowel sounds present. Ext: No swelling or edema.  No cyanosis. Neuro: Alert and oriented.  Easily distractible.  Moves all extremities.    Data Reviewed: I have personally reviewed following labs and imaging studies  CBC: Recent Labs  Lab 05/11/21 1501 05/12/21 0424 05/16/21 0626  WBC 13.4* 8.7 11.7*  NEUTROABS 11.4*  --  8.7*  HGB 11.6* 11.0* 14.8  HCT 37.2 37.3 48.0*  MCV 99.2 103.0* 97.8  PLT 187 166 0000000   Basic Metabolic Panel: Recent Labs  Lab 05/12/21 0424 05/13/21 0614 05/14/21 0646 05/15/21 0556 05/16/21 0626  NA 137 138 141 139 134*  K 3.8 3.5 3.3* 3.1* 2.9*  CL 103 96* 91* 83* 79*  CO2 24 34* 38* 42* 42*  GLUCOSE 118* 121* 108* 93 106*  BUN '16 18 15 15 16  '$ CREATININE 0.56 0.64 0.52 0.53 0.59  CALCIUM 8.3* 8.5* 8.3* 8.7* 8.2*  MG  --   --   --  1.4* 1.6*   GFR: Estimated Creatinine Clearance: 57.3 mL/min (by C-G formula based on SCr of 0.59 mg/dL). Liver Function Tests: Recent Labs  Lab 05/11/21 1501  AST 17  ALT 18  ALKPHOS 91  BILITOT 1.4*  PROT 6.1*  ALBUMIN 2.7*   No results for input(s): LIPASE, AMYLASE in the last 168 hours. No results for input(s): AMMONIA in the last 168 hours. Coagulation Profile: No results for input(s): INR, PROTIME in the last 168 hours. Cardiac Enzymes: No results for input(s): CKTOTAL, CKMB, CKMBINDEX, TROPONINI in the last 168 hours. BNP (last 3 results) No results for input(s): PROBNP in the last 8760 hours. HbA1C: No results for input(s): HGBA1C in the last 72 hours. CBG: Recent Labs  Lab 05/13/21 1148 05/13/21 1616 05/13/21 2141 05/14/21 0723 05/14/21 1111  GLUCAP 117* 113* 137* 117* 119*   Lipid Profile: No results for input(s): CHOL, HDL, LDLCALC, TRIG, CHOLHDL, LDLDIRECT in the last 72 hours. Thyroid Function Tests: No results for input(s): TSH, T4TOTAL,  FREET4, T3FREE, THYROIDAB in the last 72 hours. Anemia Panel: No results for input(s): VITAMINB12, FOLATE, FERRITIN, TIBC, IRON, RETICCTPCT in the last 72 hours. Sepsis Labs: No results for input(s): PROCALCITON, LATICACIDVEN in the last 168 hours.  Recent Results (from the past 240 hour(s))  Resp Panel by RT-PCR (Flu A&B, Covid) Nasopharyngeal Swab     Status: None   Collection Time: 05/11/21  2:50 PM   Specimen: Nasopharyngeal Swab; Nasopharyngeal(NP) swabs in vial transport medium  Result Value Ref Range Status   SARS Coronavirus 2 by RT PCR NEGATIVE NEGATIVE Final    Comment: (NOTE) SARS-CoV-2 target nucleic acids are NOT DETECTED.  The SARS-CoV-2 RNA is generally detectable in upper respiratory specimens during the acute phase of infection. The lowest concentration of SARS-CoV-2 viral copies this assay can detect is 138 copies/mL. A negative result does not preclude SARS-Cov-2 infection and  should not be used as the sole basis for treatment or other patient management decisions. A negative result may occur with  improper specimen collection/handling, submission of specimen other than nasopharyngeal swab, presence of viral mutation(s) within the areas targeted by this assay, and inadequate number of viral copies(<138 copies/mL). A negative result must be combined with clinical observations, patient history, and epidemiological information. The expected result is Negative.  Fact Sheet for Patients:  EntrepreneurPulse.com.au  Fact Sheet for Healthcare Providers:  IncredibleEmployment.be  This test is no t yet approved or cleared by the [place] FDA and  has been authorized for detection and/or diagnosis of SARS-CoV-2 by FDA under an Emergency Use Authorization (EUA). This EUA will remain  in effect (meaning this test can be used) for the duration of the COVID-19 declaration under Section 564(b)(1) of the Act, 21 U.S.C.section  360bbb-3(b)(1), unless the authorization is terminated  or revoked sooner.       Influenza A by PCR NEGATIVE NEGATIVE Final   Influenza B by PCR NEGATIVE NEGATIVE Final    Comment: (NOTE) The Xpert Xpress SARS-CoV-2/FLU/RSV plus assay is intended as an aid in the diagnosis of influenza from Nasopharyngeal swab specimens and should not be used as a sole basis for treatment. Nasal washings and aspirates are unacceptable for Xpert Xpress SARS-CoV-2/FLU/RSV testing.  Fact Sheet for Patients: EntrepreneurPulse.com.au  Fact Sheet for Healthcare Providers: IncredibleEmployment.be  This test is not yet approved or cleared by the [place] FDA and has been authorized for detection and/or diagnosis of SARS-CoV-2 by FDA under an Emergency Use Authorization (EUA). This EUA will remain in effect (meaning this test can be used) for the duration of the COVID-19 declaration under Section 564(b)(1) of the Act, 21 U.S.C. section 360bbb-3(b)(1), unless the authorization is terminated or revoked.  Performed at [facility], [address], [place], [place] [address]          Radiology Studies: No results found.      Scheduled Meds:  albuterol  2.5 mg Nebulization BID   buPROPion  300 mg Oral QHS   feeding supplement  237 mL Oral BID BM   ferrous sulfate  325 mg Oral Q breakfast   gabapentin  600 mg Oral TID   HYDROcodone Bitartrate ER  30 mg Oral Daily   metoprolol succinate  12.5 mg Oral Daily   mometasone-formoterol  2 puff Inhalation BID   potassium chloride  40 mEq Oral TID   rivaroxaban  20 mg Oral Daily   umeclidinium bromide  1 puff Inhalation Daily   Continuous Infusions:   LOS: 4 days    Time spent: 30 minutes    Barb Merino, MD [facility] Pager [phone]

## 2021-05-16 NOTE — TOC Progression Note (Signed)
Transition of Care [facility]) - Progression Note    Patient Details  Name: Meagan Holt MRN: [id number] Date of Birth: 18-Feb-1941  [facility]) CM/SW [place], [place] Phone Number: 05/16/2021, 1:20 PM  Clinical Narrative:    CSW received call that pts nephew Mr. Bennye Alm would like update on pt. CSW spoke with pt in room who states we can speak to her nephew with updates. CSW spoke with pts nephew to inform that plan is for pt to discharge to [facility] for SNF tomorrow. CSW also updated that DSS will be following pt at the facility. CSW spoke with DSS caseworker Morey Hummingbird to update her that pt will be going to [facility]. CSW confirmed DSS will work with pt at the facility on a long term plan.   CSW spoke to [place] with [facility] who states that pt can admit to the facility tomorrow. CSW updated MD and RN that pt will need a new COVID test before discharging to the facility tomorrow. TOC to follow.   Expected Discharge Plan: [place] Barriers to Discharge: Continued Medical Work up  Expected Discharge Plan and Services Expected Discharge Plan: [place] In-house Referral: Clinical Social Work Discharge Planning Services: CM Consult Post Acute Care Choice: [place] Living arrangements for the past 2 months: Apartment                                       Social Determinants of Health (SDOH) Interventions    Readmission Risk Interventions Readmission Risk Prevention Plan 05/12/2021 04/16/2020  Medication Screening Complete Complete  Transportation Screening Complete Complete  Some recent data might be hidden

## 2021-05-17 DIAGNOSIS — Z515 Encounter for palliative care: Secondary | ICD-10-CM

## 2021-05-17 DIAGNOSIS — R911 Solitary pulmonary nodule: Secondary | ICD-10-CM

## 2021-05-17 DIAGNOSIS — D1431 Benign neoplasm of right bronchus and lung: Secondary | ICD-10-CM | POA: Diagnosis present

## 2021-05-17 DIAGNOSIS — J189 Pneumonia, unspecified organism: Secondary | ICD-10-CM

## 2021-05-17 DIAGNOSIS — R9389 Abnormal findings on diagnostic imaging of other specified body structures: Secondary | ICD-10-CM

## 2021-05-17 DIAGNOSIS — Z7189 Other specified counseling: Secondary | ICD-10-CM

## 2021-05-17 DIAGNOSIS — J441 Chronic obstructive pulmonary disease with (acute) exacerbation: Secondary | ICD-10-CM | POA: Diagnosis not present

## 2021-05-17 LAB — CBC WITH DIFFERENTIAL/PLATELET
Abs Immature Granulocytes: 0.21 10*3/uL — ABNORMAL HIGH (ref 0.00–0.07)
Basophils Absolute: 0.1 10*3/uL (ref 0.0–0.1)
Basophils Relative: 0 %
Eosinophils Absolute: 0.1 10*3/uL (ref 0.0–0.5)
Eosinophils Relative: 1 %
HCT: 50.2 % — ABNORMAL HIGH (ref 36.0–46.0)
Hemoglobin: 15.2 g/dL — ABNORMAL HIGH (ref 12.0–15.0)
Immature Granulocytes: 2 %
Lymphocytes Relative: 10 %
Lymphs Abs: 1.4 10*3/uL (ref 0.7–4.0)
MCH: 30.2 pg (ref 26.0–34.0)
MCHC: 30.3 g/dL (ref 30.0–36.0)
MCV: 99.8 fL (ref 80.0–100.0)
Monocytes Absolute: 1 10*3/uL (ref 0.1–1.0)
Monocytes Relative: 8 %
Neutro Abs: 10.4 10*3/uL — ABNORMAL HIGH (ref 1.7–7.7)
Neutrophils Relative %: 79 %
Platelets: 274 10*3/uL (ref 150–400)
RBC: 5.03 MIL/uL (ref 3.87–5.11)
RDW: 14.3 % (ref 11.5–15.5)
WBC: 13.1 10*3/uL — ABNORMAL HIGH (ref 4.0–10.5)
nRBC: 0 % (ref 0.0–0.2)

## 2021-05-17 LAB — BASIC METABOLIC PANEL
Anion gap: 11 (ref 5–15)
BUN: 25 mg/dL — ABNORMAL HIGH (ref 8–23)
CO2: 41 mmol/L — ABNORMAL HIGH (ref 22–32)
Calcium: 9.2 mg/dL (ref 8.9–10.3)
Chloride: 87 mmol/L — ABNORMAL LOW (ref 98–111)
Creatinine, Ser: 0.73 mg/dL (ref 0.44–1.00)
GFR, Estimated: >60 mL/min (ref >60)
Glucose, Bld: 123 mg/dL — ABNORMAL HIGH (ref 70–99)
Potassium: 5.1 mmol/L (ref 3.5–5.1)
Sodium: 139 mmol/L (ref 135–145)

## 2021-05-17 LAB — SARS CORONAVIRUS 2 (TAT 6-24 HRS): SARS Coronavirus 2: NEGATIVE

## 2021-05-17 LAB — PHOSPHORUS: Phosphorus: 4.4 mg/dL (ref 2.5–4.6)

## 2021-05-17 LAB — MAGNESIUM: Magnesium: 2.4 mg/dL (ref 1.7–2.4)

## 2021-05-17 MED ORDER — AMOXICILLIN-POT CLAVULANATE 875-125 MG PO TABS
1.0000 | ORAL_TABLET | Freq: Two times a day (BID) | ORAL | Status: AC
  Administered 2021-05-17 – 2021-05-23 (×14): 1 via ORAL
  Filled 2021-05-17 (×14): qty 1

## 2021-05-17 MED ORDER — HYDROCODONE BITARTRATE ER 30 MG PO T24A: 30.0000 mg | EXTENDED_RELEASE_TABLET | Freq: Every day | ORAL | 0 refills | Status: AC

## 2021-05-17 MED ORDER — HYDROCODONE-ACETAMINOPHEN 10-325 MG PO TABS: 1.0000 | ORAL_TABLET | Freq: Three times a day (TID) | ORAL | 0 refills | Status: AC | PRN

## 2021-05-17 MED ORDER — FERROUS SULFATE 325 (65 FE) MG PO TABS: 325.0000 mg | ORAL_TABLET | Freq: Every day | ORAL | 3 refills | Status: AC

## 2021-05-17 MED ORDER — ZOLPIDEM TARTRATE 5 MG PO TABS: 5.0000 mg | ORAL_TABLET | Freq: Every evening | ORAL | 0 refills | Status: AC | PRN

## 2021-05-17 MED ORDER — AMOXICILLIN-POT CLAVULANATE 875-125 MG PO TABS: 1.0000 | ORAL_TABLET | Freq: Two times a day (BID) | ORAL | 0 refills | Status: AC

## 2021-05-17 NOTE — Progress Notes (Signed)
DNR bracelet placed on patient Right wrist.

## 2021-05-17 NOTE — Progress Notes (Signed)
CSW spoke to [place] with [facility] who states that pt has previous [employer] claims that need to be removed from her Medicare before they can accept pt. CSW was provided with two number for Medicare. [phone] and [phone]. CSW spoke to Medicare who claims that [facility] needs to send information to them before they can close anything. CSW spoke with [place] who states they do not have any claims with pt involved and we will need info from Medicare before they can assist. CSW confirmed information with Medicare. CSW supervisor Nancy Marus assisted in gathering the needed information. [employer] to fax needed information to Medicare. Tressa Busman with [facility] is going to follow up in the morning. Plan is for pt to transition to SNF as soon as the claim has been cleared. TOC to follow.

## 2021-05-17 NOTE — Discharge Summary (Addendum)
Physician Discharge Summary  Meagan Holt [id number] DOB: April 22, 1941 DOA: 05/11/2021  PCP: [name], MD  Admit date: 05/11/2021 Discharge date: 05/18/2021  Updated DC date to 9/14, otherwise no changes from DC summary as noted by Dr.Ghimire yesterday  Admitted From: Home Disposition: Skilled nursing facility  Recommendations for Outpatient Follow-up:  Follow up with PCP in 1-2 weeks Consult palliative care follow-up at a skilled nursing facility.  Home Health: Not applicable Equipment/Devices: Oxygen 3 to 5 L as needed.  Discharge Condition: Fair CODE STATUS: DNR/DNI Diet recommendation: Regular diet, nutritional supplements.  Discharge summary: 80 year old female with history of COPD, chronic hypoxic respiratory failure on 2-3 L oxygen at home, chronic atrial fibrillation and depression, blindness brought to the emergency room when she was found by her caretaker sitting in her own urine and feces, extremely debilitated and unable to ambulate.  Patient herself reported that she had not been eating for few days and had multiple loose stools.  She lives at an apartment, independent living.  Uses 2-3 L oxygen at home.  smokes and smoking as late as one month ago. EMS recorded oxygen of 70% on arrival but not using oxygen.  Admitted as COPD exacerbation, frailty and debility.     Assessment & Plan of care:   COPD with acute exacerbation: Treated with aggressive steroids and bronchodilator therapy.  Steroids discontinued.  Stabilizing. Continue bronchodilator therapy, chest physiotherapy and incentive spirometry.  Patient will continue steroid inhalers, scheduled bronchodilators 3 times a day and as needed bronchodilator.   Empiric antibiotic treatment for 10 days with Augmentin.   oral Lasix as needed.  Echocardiogram with ejection fraction 55 to 60%. Her oxygen requirement fluctuates, however we will keep on 2 to 4 L of oxygen or more to keep saturations more than 88%.  Right  upper lobe lung tumor: With persistent shortness of breath and wheezing, a CT scan of the chest was done that shows extensive emphysema, previously scarring's as well as 1 cm pleural-based mass on the right upper lobe suspicious for malignancy. Patient with very advanced COPD and frailty, seen by pulmonary and recommended conservative management.  Patient and family decided for conservative management.  Follow-up at pulmonary clinic for surveillance.   Hypokalemia and hypomagnesemia: Replaced aggressively with improvement.   Chronic atrial fibrillation: Rate controlled on beta-blockers.  She is on Xarelto and therapeutic.  Chronic iron deficiency anemia: Given IV iron in the hospital.  We will keep on oral iron.   Physical debility and frailty: Work with PT OT.  Will benefit with rehab at a skilled nursing facility.  Chronic pain problems: Patient is on multiple pain medications and follows up at pain clinic.  She is on long-acting oxycodone 30 mg daily as well as oxycodone 10 mg as needed.  She will continue this. Patient is also on multiple antidepressions including Wellbutrin, she is on gabapentin that she will continue.  Takes Ambien at night that she will continue.  Goal of care: Multiple goal of care discussions were held in the hospital.  Seen by palliative care team. Patient lives alone.  She has nieces and nephews.  One of her niece she designates as spokesperson, trying to complete healthcare power of attorney paperwork. Patient decided she wants to be DNR but full scope of treatment at this time. Patient decided that she would not like to pursue treatment for lung cancer if this proves to be a cancer. She will benefit with ongoing follow-up by palliative care medicine at a skilled  nursing facility. If her condition to worsen, she may even benefit with hospice.  Today, she is fairly stable and able to be transferred to a skilled nursing rehab.     DVT prophylaxis:  rivaroxaban  (XARELTO) tablet 20 mg    Discharge Diagnoses:  Principal Problem:   COPD with acute exacerbation ([place]) Active Problems:   Depression   History of stroke   Acute on chronic respiratory failure with hypoxia (HCC)   Recurrent falls   Atrial fibrillation, chronic (HCC)   IDA (iron deficiency anemia)   Macrocytic anemia   Lung tumor (benign), right    Discharge Instructions  Discharge Instructions     Ambulatory referral to Pulmonology   Complete by: As directed    Reason for referral: Lung Mass/Lung Nodule   Call MD for:  difficulty breathing, headache or visual disturbances   Complete by: As directed    Diet general   Complete by: As directed    Increase activity slowly   Complete by: As directed       Allergies as of 05/17/2021       Reactions   Latex Itching, Swelling   Doxycycline Rash        Medication List     TAKE these medications    Advair Diskus 250-50 MCG/ACT Aepb Generic drug: fluticasone-salmeterol Inhale 1 puff into the lungs 2 (two) times daily.   albuterol 108 (90 Base) MCG/ACT inhaler Commonly known as: VENTOLIN HFA Inhale 2 puffs into the lungs every 6 (six) hours as needed for wheezing or shortness of breath.   amoxicillin-clavulanate 875-125 MG tablet Commonly known as: AUGMENTIN Take 1 tablet by mouth every 12 (twelve) hours for 10 days.   buPROPion 300 MG 24 hr tablet Commonly known as: WELLBUTRIN XL Take 300 mg by mouth at bedtime.   ferrous sulfate 325 (65 FE) MG tablet Take 1 tablet (325 mg total) by mouth daily with breakfast. Start taking on: May 18, 2021   Fish Oil 1000 MG Caps Take 3 capsules by mouth daily.   furosemide 20 MG tablet Commonly known as: LASIX Take 20 mg by mouth daily as needed for fluid or edema.   gabapentin 600 MG tablet Commonly known as: NEURONTIN Take 600 mg by mouth 3 (three) times daily.   HYDROcodone Bitartrate ER 30 MG T24a Take 30 mg by mouth daily for 5 days.    HYDROcodone-acetaminophen 10-325 MG tablet Commonly known as: NORCO Take 1 tablet by mouth 3 (three) times daily as needed for up to 5 days for moderate pain.   ICAPS PO Take 1 tablet by mouth daily.   ipratropium-albuterol 0.5-2.5 (3) MG/3ML Soln Commonly known as: DUONEB Take 3 mLs by nebulization 3 (three) times daily.   metoprolol succinate 25 MG 24 hr tablet Commonly known as: TOPROL-XL Take 12.5 mg by mouth daily.   multivitamin with minerals Tabs tablet Take 1 tablet by mouth daily.   Polyethyl Glycol-Propyl Glycol 0.4-0.3 % Soln Apply 1-2 drops to eye daily as needed (for dry eye relief).   Xarelto 20 MG Tabs tablet Generic drug: rivaroxaban Take 1 tablet by mouth every evening.   zolpidem 5 MG tablet Commonly known as: AMBIEN Take 1 tablet (5 mg total) by mouth at bedtime as needed for up to 5 days for sleep.        Allergies  Allergen Reactions   Latex Itching and Swelling   Doxycycline Rash    Consultations: PCCM Palliative   Procedures/Studies: CT CHEST WO CONTRAST  Result Date: 05/16/2021 CLINICAL DATA:  Chest pain or SOB, pleurisy or effusion suspected Presenting with weakness and trouble breathing history of COPD, chronic hypoxic respiratory failure on 2 L oxygen at home EXAM: CT CHEST WITHOUT CONTRAST TECHNIQUE: Multidetector CT imaging of the chest was performed following the standard protocol without IV contrast. COMPARISON:  Chest x-ray 05/14/2021, CT abdomen pelvis 03/05/2012 FINDINGS: Cardiovascular: Question enlarged right atria with limited evaluation on this noncontrast study. No significant pericardial effusion. The thoracic aorta is normal in caliber. Severe atherosclerotic plaque of the thoracic aorta. Four-vessel coronary artery calcifications. Mediastinum/Nodes: Question right hilar adenopathy, noting limited sensitivity for the detection of hilar adenopathy on this noncontrast study. Enlarged mediastinal lymph nodes with as an example a  1.4 cm right paratracheal lymph node (2:46). Or axillary lymph nodes. Thyroid gland, trachea, and esophagus demonstrate no significant findings. Lungs/Pleura: Mild Paraseptal and centrilobular emphysematous changes. Bilateral diffuse centrilobular and tree-in-bud nodularities. Scattered ground-glass airspace opacities within the lung apices with largest region measuring up to 1.7 cm (4:28). There is a 1.1 cm pulmonary nodule within the right upper lobe with associated tethering of the pleura (4:60, 5:39). There is an associated 0.4 cm subjacent pulmonary nodule (4:75). Bilateral upper lobe dependent atelectasis. Diffuse bronchial wall thickening. No pleural effusion. No pneumothorax. Upper Abdomen: Status post cholecystectomy.  No acute abnormality. Musculoskeletal: No chest wall abnormality. No suspicious lytic or blastic osseous lesions. No acute displaced fracture. Multilevel degenerative changes of the spine. IMPRESSION: 1. Diffuse bronchial wall thickening with associated bilateral centrilobular and tree-in-bud nodularity suggestive of bronchiolitis versus atypical infection. 2. A 1.1 cm pulmonary nodule within the right upper lobe with associated tethering of the pleura is concerning for malignancy. Associated subjacent 0.4 cm pulmonary nodule. Consider one of the following in 3 months for both low-risk and high-risk individuals: (a) repeat chest CT, (b) follow-up PET-CT, or (c) tissue sampling. This recommendation follows the consensus statement: Guidelines for Management of Incidental Pulmonary Nodules Detected on CT Images: From the Fleischner Society 2017; Radiology 2017; 284:228-243. 3. Scattered biapical ground-glass airspace opacities which may represent infection/inflammation with underlying adenocarcinoma not excluded. Initial follow-up with CT at 6-12 months is recommended to confirm persistence. If persistent, repeat CT is recommended every 2 years until 5 years of stability has been established.  This recommendation follows the consensus statement: Guidelines for Management of Incidental Pulmonary Nodules Detected on CT Images: From the Fleischner Society 2017; Radiology 2017; 284:228-243. 4. Mediastinal lymphadenopathy with question of right hilar lymphadenopathy. Limited evaluation on this noncontrast study. 5. Aortic Atherosclerosis (ICD10-I70.0) and Emphysema (ICD10-J43.9). 6. Four-vessel coronary artery calcifications. Electronically Signed   By: Iven Finn M.D.   On: 05/16/2021 18:46   DG Chest Port 1 View  Result Date: 05/14/2021 CLINICAL DATA:  Pulmonary edema and weakness.  COPD. EXAM: PORTABLE CHEST 1 VIEW COMPARISON:  05/11/2021 FINDINGS: Midline trachea. Mild cardiomegaly. No pleural effusion or pneumothorax. Moderate pulmonary interstitial thickening. No lobar consolidation. IMPRESSION: Cardiomegaly and moderate pulmonary interstitial thickening, felt to be similar. This could simply be secondary to smoking/chronic bronchitis. However, pulmonary venous congestion could look similar. No overt congestive failure. Electronically Signed   By: Abigail Miyamoto M.D.   On: 05/14/2021 08:19   DG Chest Portable 1 View  Result Date: 05/11/2021 CLINICAL DATA:  Shortness of breath, worsening weakness, history of COPD EXAM: PORTABLE CHEST 1 VIEW COMPARISON:  12/11/2020 FINDINGS: Cardiomegaly. Diffuse bilateral interstitial pulmonary opacity. The visualized skeletal structures are unremarkable. IMPRESSION: Cardiomegaly with diffuse bilateral interstitial pulmonary opacity, likely mild  edema. No focal airspace opacity. Electronically Signed   By: Eddie Candle M.D.   On: 05/11/2021 15:39   ECHOCARDIOGRAM COMPLETE  Result Date: 05/12/2021    ECHOCARDIOGRAM REPORT   Patient Name:   KAMBRY AIMONE [facility] Date of Exam: 05/12/2021 Medical Rec #:  [id number]       Height:       66.0 in Accession #:    [id number]      Weight:       198.0 lb Date of Birth:  1941/06/30       BSA:          1.991 m Patient Age:    31  years        BP:           116/63 mmHg Patient Gender: F               HR:           101 bpm. Exam Location:  [facility] Procedure: 2D Echo, Cardiac Doppler and Color Doppler Indications:    Acute respiratory distress  History:        Patient has prior history of Echocardiogram examinations, most                 recent 04/15/2020. COPD and Stroke, Arrythmias:Atrial                 Fibrillation, Signs/Symptoms:Syncope; Risk Factors:Current                 Smoker.  Sonographer:    Wenda Low Referring Phys: [place]  1. Left ventricular ejection fraction, by estimation, is 55 to 60%. The left ventricle has normal function. The left ventricle has no regional wall motion abnormalities. There is mild left ventricular hypertrophy. Left ventricular diastolic parameters are indeterminate.  2. Right ventricular systolic function is low normal. The right ventricular size is moderately enlarged. There is moderately elevated pulmonary artery systolic pressure.  3. Left atrial size was severely dilated.  4. Right atrial size was severely dilated.  5. The mitral valve is abnormal. Mild mitral valve regurgitation. No evidence of mitral stenosis.  6. The tricuspid valve is abnormal.  7. The aortic valve is abnormal. There is mild calcification of the aortic valve. There is mild thickening of the aortic valve. Aortic valve regurgitation is not visualized. No aortic stenosis is present.  8. Aortic dilatation noted. There is mild dilatation of the ascending aorta, measuring 39 mm.  9. The inferior vena cava is dilated in size with <50% respiratory variability, suggesting right atrial pressure of 15 mmHg. FINDINGS  Left Ventricle: Left ventricular ejection fraction, by estimation, is 55 to 60%. The left ventricle has normal function. The left ventricle has no regional wall motion abnormalities. The left ventricular internal cavity size was normal in size. There is  mild left ventricular hypertrophy.  Left ventricular diastolic parameters are indeterminate. Right Ventricle: The right ventricular size is moderately enlarged. Right vetricular wall thickness was not well visualized. Right ventricular systolic function is low normal. There is moderately elevated pulmonary artery systolic pressure. The tricuspid  regurgitant velocity is 3.34 m/s, and with an assumed right atrial pressure of 15 mmHg, the estimated right ventricular systolic pressure is 0000000 mmHg. Left Atrium: Left atrial size was severely dilated. Right Atrium: Right atrial size was severely dilated. Pericardium: There is no evidence of pericardial effusion. Mitral Valve: The mitral valve is abnormal. There is mild thickening of the mitral valve  leaflet(s). There is mild calcification of the mitral valve leaflet(s). Mild mitral annular calcification. Mild mitral valve regurgitation. No evidence of mitral valve stenosis. MV peak gradient, 8.4 mmHg. The mean mitral valve gradient is 1.0 mmHg. Tricuspid Valve: The tricuspid valve is abnormal. Tricuspid valve regurgitation is mild . No evidence of tricuspid stenosis. Aortic Valve: The aortic valve is abnormal. There is mild calcification of the aortic valve. There is mild thickening of the aortic valve. There is mild aortic valve annular calcification. Aortic valve regurgitation is not visualized. No aortic stenosis is present. Aortic valve mean gradient measures 6.5 mmHg. Aortic valve peak gradient measures 12.7 mmHg. Aortic valve area, by VTI measures 1.53 cm. Pulmonic Valve: The pulmonic valve was not well visualized. Pulmonic valve regurgitation is trivial. No evidence of pulmonic stenosis. Aorta: The aortic root is normal in size and structure and aortic dilatation noted. There is mild dilatation of the ascending aorta, measuring 39 mm. Venous: The inferior vena cava is dilated in size with less than 50% respiratory variability, suggesting right atrial pressure of 15 mmHg. IAS/Shunts: No atrial level  shunt detected by color flow Doppler.  LEFT VENTRICLE PLAX 2D LVIDd:         4.20 cm  Diastology LVIDs:         3.03 cm  LV e' medial:    10.30 cm/s LV PW:         1.20 cm  LV E/e' medial:  12.0 LV IVS:        1.20 cm  LV e' lateral:   14.20 cm/s LVOT diam:     2.00 cm  LV E/e' lateral: 8.7 LV SV:         53 LV SV Index:   27 LVOT Area:     3.14 cm  RIGHT VENTRICLE RV Basal diam:  4.42 cm RV Mid diam:    4.13 cm RV S prime:     10.60 cm/s LEFT ATRIUM              Index       RIGHT ATRIUM           Index LA diam:        5.20 cm  2.61 cm/m  RA Area:     32.20 cm LA Vol (A2C):   153.0 ml 76.84 ml/m RA Volume:   116.00 ml 58.26 ml/m LA Vol (A4C):   114.0 ml 57.25 ml/m LA Biplane Vol: 133.0 ml 66.80 ml/m  AORTIC VALVE AV Area (Vmax):    1.62 cm AV Area (Vmean):   1.47 cm AV Area (VTI):     1.53 cm AV Vmax:           178.00 cm/s AV Vmean:          115.500 cm/s AV VTI:            0.349 m AV Peak Grad:      12.7 mmHg AV Mean Grad:      6.5 mmHg LVOT Vmax:         91.80 cm/s LVOT Vmean:        54.200 cm/s LVOT VTI:          0.170 m LVOT/AV VTI ratio: 0.49  AORTA Ao Root diam: 3.20 cm Ao Asc diam:  3.90 cm MITRAL VALVE                TRICUSPID VALVE MV Area (PHT): 5.46 cm     TR Peak grad:  44.6 mmHg MV Area VTI:   1.43 cm     TR Vmax:        334.00 cm/s MV Peak grad:  8.4 mmHg MV Mean grad:  1.0 mmHg     SHUNTS MV Vmax:       1.45 m/s     Systemic VTI:  0.17 m MV Vmean:      48.7 cm/s    Systemic Diam: 2.00 cm MV Decel Time: 139 msec MV E velocity: 124.00 cm/s Carlyle Dolly MD Electronically signed by Carlyle Dolly MD Signature Date/Time: 05/12/2021/12:22:07 PM    Final    (Echo, Carotid, EGD, Colonoscopy, ERCP)    Subjective: Patient seen and examined multiple times today.  Herself denies any complaints.  Revisited with family.  Patient is alert oriented and is very keen to know about her CT scans.  We discussed about findings with patient and family.  She has some shortness of breath and wheezing but  patient tells me this is nothing new.   Discharge Exam: Vitals:   05/17/21 0756 05/17/21 0927  BP:    Pulse:  92  Resp:    Temp:    SpO2: 93% 91%   Vitals:   05/17/21 0754 05/17/21 0755 05/17/21 0756 05/17/21 0927  BP:      Pulse:    92  Resp:      Temp:      TempSrc:      SpO2: 91% 91% 93% 91%  Weight:        General: Pt is alert, awake, not in acute distress Frail and debilitated.  Chronically sick looking.  Currently on 4 L oxygen. Cardiovascular: RRR, S1/S2 +, no rubs, no gallops Respiratory: Bilateral extensive conducted upper airway sounds.  Expiratory wheezes. Poor bilateral air entry. Abdominal: Soft, NT, ND, bowel sounds + Extremities: no edema, no cyanosis    The results of significant diagnostics from this hospitalization (including imaging, microbiology, ancillary and laboratory) are listed below for reference.     Microbiology: Recent Results (from the past 240 hour(s))  Resp Panel by RT-PCR (Flu A&B, Covid) Nasopharyngeal Swab     Status: None   Collection Time: 05/11/21  2:50 PM   Specimen: Nasopharyngeal Swab; Nasopharyngeal(NP) swabs in vial transport medium  Result Value Ref Range Status   SARS Coronavirus 2 by RT PCR NEGATIVE NEGATIVE Final    Comment: (NOTE) SARS-CoV-2 target nucleic acids are NOT DETECTED.  The SARS-CoV-2 RNA is generally detectable in upper respiratory specimens during the acute phase of infection. The lowest concentration of SARS-CoV-2 viral copies this assay can detect is 138 copies/mL. A negative result does not preclude SARS-Cov-2 infection and should not be used as the sole basis for treatment or other patient management decisions. A negative result may occur with  improper specimen collection/handling, submission of specimen other than nasopharyngeal swab, presence of viral mutation(s) within the areas targeted by this assay, and inadequate number of viral copies(<138 copies/mL). A negative result must be combined  with clinical observations, patient history, and epidemiological information. The expected result is Negative.  Fact Sheet for Patients:  EntrepreneurPulse.com.au  Fact Sheet for Healthcare Providers:  IncredibleEmployment.be  This test is no t yet approved or cleared by the [place] FDA and  has been authorized for detection and/or diagnosis of SARS-CoV-2 by FDA under an Emergency Use Authorization (EUA). This EUA will remain  in effect (meaning this test can be used) for the duration of the COVID-19 declaration under Section 564(b)(1) of the  Act, 21 U.S.C.section 360bbb-3(b)(1), unless the authorization is terminated  or revoked sooner.       Influenza A by PCR NEGATIVE NEGATIVE Final   Influenza B by PCR NEGATIVE NEGATIVE Final    Comment: (NOTE) The Xpert Xpress SARS-CoV-2/FLU/RSV plus assay is intended as an aid in the diagnosis of influenza from Nasopharyngeal swab specimens and should not be used as a sole basis for treatment. Nasal washings and aspirates are unacceptable for Xpert Xpress SARS-CoV-2/FLU/RSV testing.  Fact Sheet for Patients: EntrepreneurPulse.com.au  Fact Sheet for Healthcare Providers: IncredibleEmployment.be  This test is not yet approved or cleared by the [place] FDA and has been authorized for detection and/or diagnosis of SARS-CoV-2 by FDA under an Emergency Use Authorization (EUA). This EUA will remain in effect (meaning this test can be used) for the duration of the COVID-19 declaration under Section 564(b)(1) of the Act, 21 U.S.C. section 360bbb-3(b)(1), unless the authorization is terminated or revoked.  Performed at [facility], [address] [place], [place] [address]   SARS CORONAVIRUS 2 (TAT 6-24 HRS) Nasopharyngeal Nasopharyngeal Swab     Status: None   Collection Time: 05/16/21  1:20 PM   Specimen: Nasopharyngeal Swab  Result Value Ref Range Status    SARS Coronavirus 2 NEGATIVE NEGATIVE Final    Comment: (NOTE) SARS-CoV-2 target nucleic acids are NOT DETECTED.  The SARS-CoV-2 RNA is generally detectable in upper and lower respiratory specimens during the acute phase of infection. Negative results do not preclude SARS-CoV-2 infection, do not rule out co-infections with other pathogens, and should not be used as the sole basis for treatment or other patient management decisions. Negative results must be combined with clinical observations, patient history, and epidemiological information. The expected result is Negative.  Fact Sheet for Patients: SugarRoll.be  Fact Sheet for Healthcare Providers: https://www.woods-mathews.com/  This test is not yet approved or cleared by the [place] FDA and  has been authorized for detection and/or diagnosis of SARS-CoV-2 by FDA under an Emergency Use Authorization (EUA). This EUA will remain  in effect (meaning this test can be used) for the duration of the COVID-19 declaration under Se ction 564(b)(1) of the Act, 21 U.S.C. section 360bbb-3(b)(1), unless the authorization is terminated or revoked sooner.  Performed at [facility], [place] [address] [place], [place] [address]      Labs: BNP (last 3 results) Recent Labs    05/11/21 1502  BNP [id number]   Basic Metabolic Panel: Recent Labs  Lab 05/13/21 0614 05/14/21 0646 05/15/21 0556 05/16/21 0626 05/17/21 0641  NA 138 141 139 134* 139  K 3.5 3.3* 3.1* 2.9* 5.1  CL 96* 91* 83* 79* 87*  CO2 34* 38* 42* 42* 41*  GLUCOSE 121* 108* 93 106* 123*  BUN '18 15 15 16 '$ 25*  CREATININE 0.64 0.52 0.53 0.59 0.73  CALCIUM 8.5* 8.3* 8.7* 8.2* 9.2  MG  --   --  1.4* 1.6* 2.4  PHOS  --   --   --   --  4.4   Liver Function Tests: Recent Labs  Lab 05/11/21 1501  AST 17  ALT 18  ALKPHOS 91  BILITOT 1.4*  PROT 6.1*  ALBUMIN 2.7*   No results for input(s): LIPASE, AMYLASE in the last 168  hours. No results for input(s): AMMONIA in the last 168 hours. CBC: Recent Labs  Lab 05/11/21 1501 05/12/21 0424 05/16/21 0626 05/17/21 0641  WBC 13.4* 8.7 11.7* 13.1*  NEUTROABS 11.4*  --  8.7* 10.4*  HGB 11.6* 11.0* 14.8 15.2*  HCT 37.2 37.3 48.0* 50.2*  MCV 99.2 103.0* 97.8 99.8  PLT 187 166 246 274   Cardiac Enzymes: No results for input(s): CKTOTAL, CKMB, CKMBINDEX, TROPONINI in the last 168 hours. BNP: Invalid input(s): POCBNP CBG: Recent Labs  Lab 05/13/21 1148 05/13/21 1616 05/13/21 2141 05/14/21 0723 05/14/21 1111  GLUCAP 117* 113* 137* 117* 119*   D-Dimer No results for input(s): DDIMER in the last 72 hours. Hgb A1c No results for input(s): HGBA1C in the last 72 hours. Lipid Profile No results for input(s): CHOL, HDL, LDLCALC, TRIG, CHOLHDL, LDLDIRECT in the last 72 hours. Thyroid function studies No results for input(s): TSH, T4TOTAL, T3FREE, THYROIDAB in the last 72 hours.  Invalid input(s): FREET3 Anemia work up No results for input(s): VITAMINB12, FOLATE, FERRITIN, TIBC, IRON, RETICCTPCT in the last 72 hours. Urinalysis    Component Value Date/Time   COLORURINE YELLOW 12/11/2020 2018   APPEARANCEUR CLEAR 12/11/2020 2018   LABSPEC 1.019 12/11/2020 2018   PHURINE 5.0 12/11/2020 2018   GLUCOSEU NEGATIVE 12/11/2020 2018   HGBUR SMALL (A) 12/11/2020 2018   BILIRUBINUR NEGATIVE 12/11/2020 2018   KETONESUR 80 (A) 12/11/2020 2018   PROTEINUR 30 (A) 12/11/2020 2018   UROBILINOGEN 0.2 07/30/2013 1831   NITRITE NEGATIVE 12/11/2020 2018   LEUKOCYTESUR NEGATIVE 12/11/2020 2018   Sepsis Labs Invalid input(s): PROCALCITONIN,  WBC,  LACTICIDVEN Microbiology Recent Results (from the past 240 hour(s))  Resp Panel by RT-PCR (Flu A&B, Covid) Nasopharyngeal Swab     Status: None   Collection Time: 05/11/21  2:50 PM   Specimen: Nasopharyngeal Swab; Nasopharyngeal(NP) swabs in vial transport medium  Result Value Ref Range Status   SARS Coronavirus 2 by RT PCR  NEGATIVE NEGATIVE Final    Comment: (NOTE) SARS-CoV-2 target nucleic acids are NOT DETECTED.  The SARS-CoV-2 RNA is generally detectable in upper respiratory specimens during the acute phase of infection. The lowest concentration of SARS-CoV-2 viral copies this assay can detect is 138 copies/mL. A negative result does not preclude SARS-Cov-2 infection and should not be used as the sole basis for treatment or other patient management decisions. A negative result may occur with  improper specimen collection/handling, submission of specimen other than nasopharyngeal swab, presence of viral mutation(s) within the areas targeted by this assay, and inadequate number of viral copies(<138 copies/mL). A negative result must be combined with clinical observations, patient history, and epidemiological information. The expected result is Negative.  Fact Sheet for Patients:  EntrepreneurPulse.com.au  Fact Sheet for Healthcare Providers:  IncredibleEmployment.be  This test is no t yet approved or cleared by the [place] FDA and  has been authorized for detection and/or diagnosis of SARS-CoV-2 by FDA under an Emergency Use Authorization (EUA). This EUA will remain  in effect (meaning this test can be used) for the duration of the COVID-19 declaration under Section 564(b)(1) of the Act, 21 U.S.C.section 360bbb-3(b)(1), unless the authorization is terminated  or revoked sooner.       Influenza A by PCR NEGATIVE NEGATIVE Final   Influenza B by PCR NEGATIVE NEGATIVE Final    Comment: (NOTE) The Xpert Xpress SARS-CoV-2/FLU/RSV plus assay is intended as an aid in the diagnosis of influenza from Nasopharyngeal swab specimens and should not be used as a sole basis for treatment. Nasal washings and aspirates are unacceptable for Xpert Xpress SARS-CoV-2/FLU/RSV testing.  Fact Sheet for Patients: EntrepreneurPulse.com.au  Fact Sheet for  Healthcare Providers: IncredibleEmployment.be  This test is not yet approved or cleared  by the [place] and has been authorized for detection and/or diagnosis of SARS-CoV-2 by FDA under an Emergency Use Authorization (EUA). This EUA will remain in effect (meaning this test can be used) for the duration of the COVID-19 declaration under Section 564(b)(1) of the Act, 21 U.S.C. section 360bbb-3(b)(1), unless the authorization is terminated or revoked.  Performed at [facility], [address], [place], [place] [address]   SARS CORONAVIRUS 2 (TAT 6-24 HRS) Nasopharyngeal Nasopharyngeal Swab     Status: None   Collection Time: 05/16/21  1:20 PM   Specimen: Nasopharyngeal Swab  Result Value Ref Range Status   SARS Coronavirus 2 NEGATIVE NEGATIVE Final    Comment: (NOTE) SARS-CoV-2 target nucleic acids are NOT DETECTED.  The SARS-CoV-2 RNA is generally detectable in upper and lower respiratory specimens during the acute phase of infection. Negative results do not preclude SARS-CoV-2 infection, do not rule out co-infections with other pathogens, and should not be used as the sole basis for treatment or other patient management decisions. Negative results must be combined with clinical observations, patient history, and epidemiological information. The expected result is Negative.  Fact Sheet for Patients: SugarRoll.be  Fact Sheet for Healthcare Providers: https://www.woods-mathews.com/  This test is not yet approved or cleared by the [place] FDA and  has been authorized for detection and/or diagnosis of SARS-CoV-2 by FDA under an Emergency Use Authorization (EUA). This EUA will remain  in effect (meaning this test can be used) for the duration of the COVID-19 declaration under Se ction 564(b)(1) of the Act, 21 U.S.C. section 360bbb-3(b)(1), unless the authorization is terminated or revoked sooner.  Performed  at [facility], [place] [address] [place], [place] [address]      Time coordinating discharge:  40 minutes  SIGNED:   Barb Merino, MD  [facility] 05/17/2021, 2:41 PM

## 2021-05-17 NOTE — Consult Note (Signed)
[facility]   Patient name: Meagan Holt Admit date: 05/11/2021  DOB: 08/03/1941 LOS: 5  MRN: [id number] Consult date: 05/17/2021  Referring provider: Dr. Sloan Leiter, [facility]: Abnormal CT chest    History:  80 yo female smoker brought to [facility] ER with weakness and dyspnea.  SpO2 70% on room air.  Found covered in urine/feces at her apartment where she was living alone.  She was started on therapy for COPD exacerbation and acute on chronic diastolic CHF.  She had CT chest which showed changes of pneumonia and pleural based Lt lung nodule.  PCCM consulted to discuss management options.  Past medical history:  COPD on 3 liters oxygen, A fib, Depression  Significant events:  9/07 Admit 9/13 Palliative care consulted, DNR  Studies:  Echo 05/12/21 >> EF 55 to 60%, mild LVH, mod RV enlargement, severe LA dilation, mild MR, aortic root 39 mm CT chest 05/16/21 >> borderline LAN, mild paraseptal and centrilobular emphysema, diffuse bilateral centrilobular and tree in bud nodularity, scattered GGO, 1.1 cm nodule RUL tethered to pleura  Micro:  COVID/flu 9/07 >> negative COVID 9/12 >> negative  Lines:     Antibiotics:  Augmentin 9/13 >>   Consults:      Interim history:  She has cough with chest congestion.  Not having chest pain or wheeze.  Vital signs:  BP 116/81 (BP Location: Right Arm)   Pulse 92   Temp 98.5 F (36.9 C) (Oral)   Resp 18   Wt 78.4 kg   SpO2 91%   BMI 27.90 kg/m   Intake/output:  I/O last 3 completed shifts: In: 170 [P.O.:120; IV Piggyback:50] Out: 2900 [Urine:2900]   Physical exam:   General - alert ENT - no sinus tenderness, no stridor Cardiac - irregular Chest - b/l crackles, no wheeze Abdomen - soft, non tender, + bowel sounds Extremities - no cyanosis, clubbing, or edema Skin - no rashes Neuro - normal strength, moves extremities, follows commands Psych - normal mood and behavior   Best  practice:   DVT - xarelto SUP - not indicated Nutrition - regular diet Mobility - PT/OT   Assessment/plan:   Rt upper lung nodule. - difficult to whether this is malignancy - she would like to pursue a conservative approach, and would not be inclined to have biopsy or therapy at this time - can arrange for pulmonary office follow up in few weeks and can then reassess  COPD exacerbation from pneumonia. - continue ABx - continue inhaler regimen  Acute on chronic diastolic CHF. Atrial fibrillation. - continue toprol, xarelto  Depression. - continue bupropion, neurontin  Social determinants of health. - she will need rehab after this hospital admission, and then family is considering options for long term residency  Goals of care. - palliative care consulted - DNR  Resolved hospital problems:    Goals of care/Family discussions:  Code status: DNR  Updated pt's daughter at bedside.  Labs:   CMP Latest Ref Rng & Units 05/17/2021 05/16/2021 05/15/2021  Glucose 70 - 99 mg/dL 123(H) 106(H) 93  BUN 8 - 23 mg/dL 25(H) 16 15  Creatinine 0.44 - 1.00 mg/dL 0.73 0.59 0.53  Sodium 135 - 145 mmol/L 139 134(L) 139  Potassium 3.5 - 5.1 mmol/L 5.1 2.9(L) 3.1(L)  Chloride 98 - 111 mmol/L 87(L) 79(L) 83(L)  CO2 22 - 32 mmol/L 41(H) 42(H) 42(H)  Calcium 8.9 - 10.3 mg/dL 9.2 8.2(L) 8.7(L)  Total Protein 6.5 - 8.1 g/dL - - -  Total Bilirubin 0.3 - 1.2 mg/dL - - -  Alkaline Phos 38 - 126 U/L - - -  AST 15 - 41 U/L - - -  ALT 0 - 44 U/L - - -    CBC Latest Ref Rng & Units 05/17/2021 05/16/2021 05/12/2021  WBC 4.0 - 10.5 K/uL 13.1(H) 11.7(H) 8.7  Hemoglobin 12.0 - 15.0 g/dL 15.2(H) 14.8 11.0(L)  Hematocrit 36.0 - 46.0 % 50.2(H) 48.0(H) 37.3  Platelets 150 - 400 K/uL 274 246 166    ABG    Component Value Date/Time   PHART 7.305 (L) 04/25/2020 2245   PCO2ART 57.8 (H) 04/25/2020 2245   PO2ART 60.5 (L) 04/25/2020 2245   HCO3 25.2 04/25/2020 2245   TCO2 28.7 07/31/2013 0131   O2SAT  87.4 04/25/2020 2245    CBG (last 3)  No results for input(s): GLUCAP in the last 72 hours.   Past surgical history:  She  has a past surgical history that includes Gastric bypass; Cholecystectomy; Appendectomy; Tonsillectomy; Excisional hemorrhoidectomy; Knee arthroscopy (Left); Cataract extraction w/ intraocular lens  implant, bilateral (Bilateral); Back surgery; Bilateral oophorectomy (Bilateral); Kidney stone surgery; and Hernia repair.  Social history:  She  reports that she has been smoking cigarettes. She has a 42.00 pack-year smoking history. She has never used smokeless tobacco. She reports current alcohol use. She reports that she does not use drugs.   Review of systems:  Reviewed and negative  Family history:  Her family history includes Stomach cancer in her father.    Medications:   No current facility-administered medications on file prior to encounter.   Current Outpatient Medications on File Prior to Encounter  Medication Sig   albuterol (VENTOLIN HFA) 108 (90 Base) MCG/ACT inhaler Inhale 2 puffs into the lungs every 6 (six) hours as needed for wheezing or shortness of breath.   buPROPion (WELLBUTRIN XL) 300 MG 24 hr tablet Take 300 mg by mouth at bedtime.    furosemide (LASIX) 20 MG tablet Take 20 mg by mouth daily as needed for fluid or edema.   gabapentin (NEURONTIN) 600 MG tablet Take 600 mg by mouth 3 (three) times daily.   metoprolol succinate (TOPROL-XL) 25 MG 24 hr tablet Take 12.5 mg by mouth daily.   Multiple Vitamin (MULTIVITAMIN WITH MINERALS) TABS tablet Take 1 tablet by mouth daily.   Multiple Vitamins-Minerals (ICAPS PO) Take 1 tablet by mouth daily.   Omega-3 Fatty Acids (FISH OIL) 1000 MG CAPS Take 3 capsules by mouth daily.   Polyethyl Glycol-Propyl Glycol 0.4-0.3 % SOLN Apply 1-2 drops to eye daily as needed (for dry eye relief).   XARELTO 20 MG TABS tablet Take 1 tablet by mouth every evening.   zolpidem (AMBIEN) 5 MG tablet Take 1 tablet (5 mg  total) by mouth at bedtime as needed for sleep.   ADVAIR DISKUS 250-50 MCG/DOSE AEPB Inhale 1 puff into the lungs 2 (two) times daily. (Patient not taking: Reported on 05/12/2021)   ipratropium-albuterol (DUONEB) 0.5-2.5 (3) MG/3ML SOLN Take 3 mLs by nebulization 3 (three) times daily. (Patient not taking: Reported on 05/12/2021)     Signature:  Chesley Mires, MD [place] Pager - [phone] 05/17/2021, 2:31 PM

## 2021-05-17 NOTE — Progress Notes (Signed)
PT Cancellation Note  Patient Details Name: SKYAH LONGCORE MRN: [id number] DOB: August 01, 1941   Cancelled Treatment:    Reason Eval/Treat Not Completed: Other (comment) (MD in room, not available for therapy. Will attempt later if available.)  Ihor Austin, LPTA/CLT; CBIS [phone]  Aldona Lento 05/17/2021, 11:28 AM

## 2021-05-17 NOTE — Consult Note (Signed)
Consultation Note Date: 05/17/2021   Patient Name: Meagan Holt  DOB: 06-Sep-1940  MRN: [id number]  Age / Sex: 80 y.o., female  PCP: [name], MD Referring Physician: Barb Merino, MD  Reason for Consultation: Establishing goals of care  HPI/Patient Profile: 80 y.o. female  with past medical history of COPD, atrial fibrillation, stroke, blindness, GERD, kidney stones, depression admitted on 05/11/2021 with shortness of breath, weakness, falls after found at home sitting in urine and feces. Treated for COPD exacerbation.   Clinical Assessment and Goals of Care: I met today with Meagan Holt and no family at bedside although we were joined by her niece [name] and her husband later on. Meagan Holt is overwhelmed with the information that she likely has cancer. She has been struggling with declining health for some time now. She has been on the phone calling her family to let them know what is going on. Meagan Holt shares that her main thoughts are about dying. She says this is something that she has thought about over time. With more conversation it becomes evident that Meagan Holt does not want to suffer and is not interested in aggressive/invasive measures or work up. She does not desire biopsy or treatment of her lung mass. She wants to do what she can to feel as good as possible. She knows that she needs more help and cannot care for herself. We discuss code status and she confirms that she would not want to be resuscitated or be on life support or breathing machines. She talks of her mother dying peacefully but her father not so peacefully. I reassure her that there is much we can do to ensure that she is comfortable when her time comes. She is also reassured that her nephew, Durene Fruits, is coming to get her dog to take him home to care for him in [place].   I updated [name] regarding my conversation with Meagan Holt and the  wishes she expressed to me regarding no desire for aggressive/invasive measures or work up. [name] understands and plans to speak with her further herself as well.   I left Advance Directive at bedside for Meagan Holt and family to review. If they cannot review (they have to return to [facility]) I will review with Meagan Holt tomorrow.   Primary Decision Maker PATIENT    SUMMARY OF RECOMMENDATIONS   - DNR decided - Treat the treatable - Will need placement - Recommend outpatient palliative to follow  Code Status/Advance Care Planning: DNR   Symptom Management:  Per attending.   Palliative Prophylaxis:  Aspiration, Bowel Regimen, Delirium Protocol, Frequent Pain Assessment, Oral Care, and Turn Reposition  Prognosis:  Overall prognosis poor mainly due to progressing COPD and declining functional status.   Discharge Planning: [place] for rehab with Palliative care service follow-up      Primary Diagnoses: Present on Admission:  COPD with acute exacerbation ([place])  Acute on chronic respiratory failure with hypoxia ([place])  Depression   I have reviewed the medical  record, interviewed the patient and family, and examined the patient. The following aspects are pertinent.  Past Medical History:  Diagnosis Date   Arthritis    "all my joints" (07/30/2013)   Chronic back pain    Chronic bronchitis ([place])    "get it q year" (07/30/2013)   COPD (chronic obstructive pulmonary disease) ([place])    Depression    Falls frequently    "fell twice in the last 2 days" (07/30/2013)   GERD (gastroesophageal reflux disease)    "rarely" (07/30/2013)   H/O hiatal hernia    History of blood transfusion    Kidney stones    Stroke [facility])    Social History   Socioeconomic History   Marital status: Widowed    Spouse name: Not on file   Number of children: Not on file   Years of education: Not on file   Highest education level: Not on file  Occupational History   Not  on file  Tobacco Use   Smoking status: Every Day    Packs/day: 1.00    Years: 42.00    Pack years: 42.00    Types: Cigarettes   Smokeless tobacco: Never  Substance and Sexual Activity   Alcohol use: Yes    Comment: rarely    Drug use: No   Sexual activity: Yes  Other Topics Concern   Not on file  Social History Narrative   Not on file   Social Determinants of Health   Financial Resource Strain: Not on file  Food Insecurity: Not on file  Transportation Needs: Not on file  Physical Activity: Not on file  Stress: Not on file  Social Connections: Not on file   Family History  Problem Relation Age of Onset   Stomach cancer Father    Scheduled Meds:  albuterol  2.5 mg Nebulization BID   amoxicillin-clavulanate  1 tablet Oral Q12H   buPROPion  300 mg Oral QHS   feeding supplement  237 mL Oral BID BM   ferrous sulfate  325 mg Oral Q breakfast   gabapentin  600 mg Oral TID   HYDROcodone Bitartrate ER  30 mg Oral Daily   metoprolol succinate  12.5 mg Oral Daily   mometasone-formoterol  2 puff Inhalation BID   rivaroxaban  20 mg Oral Daily   umeclidinium bromide  1 puff Inhalation Daily   Continuous Infusions: PRN Meds:.acetaminophen **OR** [DISCONTINUED] acetaminophen, albuterol, furosemide, HYDROcodone-acetaminophen, ondansetron **OR** ondansetron (ZOFRAN) IV, polyethylene glycol, zolpidem Allergies  Allergen Reactions   Latex Itching and Swelling   Doxycycline Rash   Review of Systems  Constitutional:  Positive for activity change, appetite change and fatigue.  Respiratory:  Negative for shortness of breath.   Neurological:  Positive for weakness.   Physical Exam Vitals and nursing note reviewed.  Constitutional:      General: She is not in acute distress.    Appearance: She is ill-appearing.  Cardiovascular:     Rate and Rhythm: Normal rate.  Pulmonary:     Effort: No tachypnea, accessory muscle usage or respiratory distress.  Abdominal:     General: Abdomen  is flat.     Palpations: Abdomen is soft.  Neurological:     Mental Status: She is alert and oriented to person, place, and time.    Vital Signs: BP 116/81 (BP Location: Right Arm)   Pulse 92   Temp 98.5 F (36.9 C) (Oral)   Resp 18   Wt 78.4 kg   SpO2 91%  BMI 27.90 kg/m  Pain Scale: 0-10   Pain Score: 0-No pain   SpO2: SpO2: 91 % O2 Device:SpO2: 91 % O2 Flow Rate: .O2 Flow Rate (L/min): 10 L/min  IO: Intake/output summary:  Intake/Output Summary (Last 24 hours) at 05/17/2021 1047 Last data filed at 05/17/2021 1040 Gross per 24 hour  Intake 526 ml  Output 800 ml  Net -274 ml    LBM: Last BM Date: 05/14/21 Baseline Weight: Weight: 90.7 kg Most recent weight: Weight: 78.4 kg     Palliative Assessment/Data:      Time Total: 75 min  Greater than 50%  of this time was spent counseling and coordinating care related to the above assessment and plan.  Signed by: Vinie Sill, NP Palliative Medicine Team Pager # [phone] (M-F 8a-5p) Team Phone # [phone] (Nights/Weekends)

## 2021-05-18 ENCOUNTER — Inpatient Hospital Stay (HOSPITAL_COMMUNITY): Payer: Medicare Other

## 2021-05-18 DIAGNOSIS — Z515 Encounter for palliative care: Secondary | ICD-10-CM | POA: Diagnosis not present

## 2021-05-18 DIAGNOSIS — J441 Chronic obstructive pulmonary disease with (acute) exacerbation: Secondary | ICD-10-CM | POA: Diagnosis not present

## 2021-05-18 DIAGNOSIS — Z7189 Other specified counseling: Secondary | ICD-10-CM | POA: Diagnosis not present

## 2021-05-18 LAB — MAGNESIUM: Magnesium: 2 mg/dL (ref 1.7–2.4)

## 2021-05-18 LAB — BASIC METABOLIC PANEL
Anion gap: 12 (ref 5–15)
BUN: 27 mg/dL — ABNORMAL HIGH (ref 8–23)
CO2: 36 mmol/L — ABNORMAL HIGH (ref 22–32)
Calcium: 8.5 mg/dL — ABNORMAL LOW (ref 8.9–10.3)
Chloride: 86 mmol/L — ABNORMAL LOW (ref 98–111)
Creatinine, Ser: 0.74 mg/dL (ref 0.44–1.00)
GFR, Estimated: >60 mL/min (ref >60)
Glucose, Bld: 94 mg/dL (ref 70–99)
Potassium: 4.8 mmol/L (ref 3.5–5.1)
Sodium: 134 mmol/L — ABNORMAL LOW (ref 135–145)

## 2021-05-18 LAB — CBC WITH DIFFERENTIAL/PLATELET
Abs Immature Granulocytes: 0.24 10*3/uL — ABNORMAL HIGH (ref 0.00–0.07)
Basophils Absolute: 0.1 10*3/uL (ref 0.0–0.1)
Basophils Relative: 1 %
Eosinophils Absolute: 0.3 10*3/uL (ref 0.0–0.5)
Eosinophils Relative: 2 %
HCT: 44.6 % (ref 36.0–46.0)
Hemoglobin: 13.3 g/dL (ref 12.0–15.0)
Immature Granulocytes: 1 %
Lymphocytes Relative: 8 %
Lymphs Abs: 1.4 10*3/uL (ref 0.7–4.0)
MCH: 30 pg (ref 26.0–34.0)
MCHC: 29.8 g/dL — ABNORMAL LOW (ref 30.0–36.0)
MCV: 100.5 fL — ABNORMAL HIGH (ref 80.0–100.0)
Monocytes Absolute: 0.9 10*3/uL (ref 0.1–1.0)
Monocytes Relative: 6 %
Neutro Abs: 13.7 10*3/uL — ABNORMAL HIGH (ref 1.7–7.7)
Neutrophils Relative %: 82 %
Platelets: 241 10*3/uL (ref 150–400)
RBC: 4.44 MIL/uL (ref 3.87–5.11)
RDW: 14.2 % (ref 11.5–15.5)
WBC: 16.6 10*3/uL — ABNORMAL HIGH (ref 4.0–10.5)
nRBC: 0 % (ref 0.0–0.2)

## 2021-05-18 LAB — BLOOD GAS, ARTERIAL
Acid-Base Excess: 12.3 mmol/L — ABNORMAL HIGH (ref 0.0–2.0)
Bicarbonate: 34.6 mmol/L — ABNORMAL HIGH (ref 20.0–28.0)
FIO2: 100
O2 Saturation: 94.6 %
Patient temperature: 36.5
pCO2 arterial: 55.8 mmHg — ABNORMAL HIGH (ref 32.0–48.0)
pH, Arterial: 7.438 (ref 7.350–7.450)
pO2, Arterial: 74.9 mmHg — ABNORMAL LOW (ref 83.0–108.0)

## 2021-05-18 MED ORDER — MAGNESIUM SULFATE 2 GM/50ML IV SOLN
2.0000 g | Freq: Once | INTRAVENOUS | Status: AC
  Administered 2021-05-18: 2 g via INTRAVENOUS
  Filled 2021-05-18: qty 50

## 2021-05-18 MED ORDER — GUAIFENESIN-DM 100-10 MG/5ML PO SYRP: 5.0000 mL | ORAL_SOLUTION | ORAL | Status: DC | PRN

## 2021-05-18 MED ORDER — FUROSEMIDE 10 MG/ML IJ SOLN
20.0000 mg | Freq: Once | INTRAMUSCULAR | Status: AC
  Administered 2021-05-18: 20 mg via INTRAVENOUS
  Filled 2021-05-18: qty 2

## 2021-05-18 MED ORDER — CHLORHEXIDINE GLUCONATE CLOTH 2 % EX PADS
6.0000 | MEDICATED_PAD | Freq: Every day | CUTANEOUS | Status: DC
  Administered 2021-05-18 – 2021-05-23 (×6): 6 via TOPICAL

## 2021-05-18 MED ORDER — METHYLPREDNISOLONE SODIUM SUCC 125 MG IJ SOLR
125.0000 mg | Freq: Once | INTRAMUSCULAR | Status: AC
  Administered 2021-05-18: 125 mg via INTRAVENOUS
  Filled 2021-05-18: qty 2

## 2021-05-18 MED ORDER — METHYLPREDNISOLONE SODIUM SUCC 125 MG IJ SOLR
80.0000 mg | Freq: Two times a day (BID) | INTRAMUSCULAR | Status: DC
  Administered 2021-05-19 – 2021-05-20 (×4): 80 mg via INTRAVENOUS
  Filled 2021-05-18 (×4): qty 2

## 2021-05-18 NOTE — TOC Transition Note (Signed)
Transition of Care [facility]) - CM/SW Discharge Note   Patient Details  Name: Meagan Holt MRN: [id number] Date of Birth: September 20, 1940  [facility] CM/SW Contact:  Iona Beard, [place] Phone Number: 05/18/2021, 12:17 PM   Clinical Narrative:    CSW confirmed with Tressa Busman at [facility] that pt can admit to the facility today if confirmed fax had been sent from insurance company. CSW confirmed with Nancy Marus [facility] supervisor that she spoke with claim worker Margrett Rud [phone] who states she would send fax to Medicare by end of day yesterday. Tressa Busman is agreeable to accepting pt. Pt is COVID vaccinated and boosted x2. CSW confirmed pt had a new covid test that is negative.   Facility requesting updated discharge summary with todays date. CSW sent updated d/c summary to facility in the hub. CSW updated MD and RN. RN provided with room number and number for report. RN to call for transport when pt is ready. TOC signing off.   CSW spoke with pt about transition to facility, she is agreeable. CSW left VM for pts niece [name] to update of discharge.  Final next level of care: Skilled Nursing Facility Barriers to Discharge: Barriers Resolved   Patient Goals and CMS Choice Patient states their goals for this hospitalization and ongoing recovery are:: Go to SNF CMS Medicare.gov Compare Post Acute Care list provided to:: Patient Choice offered to / list presented to : Patient  Discharge Placement              Patient chooses bed at: [facility] Patient to be transferred to facility by: [facility] Name of family member notified: Creed Copper (Niece) [phone] Patient and family notified of of transfer: 05/18/21  Discharge Plan and Services In-house Referral: Clinical Social Work Discharge Planning Services: CM Consult Post Acute Care Choice: [place]                               Social Determinants of Health (SDOH) Interventions      Readmission Risk Interventions Readmission Risk Prevention Plan 05/12/2021 04/16/2020  Medication Screening Complete Complete  Transportation Screening Complete Complete  Some recent data might be hidden

## 2021-05-18 NOTE — Progress Notes (Signed)
Patient is currently still on [place] HFNC at around 13L with sat of 100%.  Patient had a SOB episode just before shift change and Bipap was ordered and patient was moved to [place].  CXR showed Mild worsening of bilateral pleural effusions and Lasix was given as well as Solumedrol re-started.  Patient BS are C/D.  I do not know if patient would tolerate Bipap at this time and would probably try to take mask off at every opportunity.  Patient has been playing with O2 sensor and placing cords in her mouth.  Bipap is at bedside if needed, but watching patient for any declining status but she is currently maintaining her sats.

## 2021-05-18 NOTE — Progress Notes (Signed)
Palliative:  HPI: 80 y.o. female  with past medical history of COPD, atrial fibrillation, stroke, blindness, GERD, kidney stones, depression admitted on 05/11/2021 with shortness of breath, weakness, falls after found at home sitting in urine and feces. Treated for COPD exacerbation.    I met this morning with Meagan Holt. She is still a little sleepy this morning but wishes to review Advance Directive with me. We did review and she makes her decisions very easily. She would like HCPOA to be [name] Rosendo Gros and secondary Letha [place]. She would NOT want extraordinary measures if in terminal state, comatose state, or advanced dementia. She also does NOT desire artificial nutrition. Final decisions she will leave up to [employer]. She feels comfortable with her decisions and has no questions or concerns. She would like some time to rest now. Emotional support provided.   Exam: Alert, oriented. No distress. Feels well just tired today. A lot of activity yesterday so she may be tired from all her visitors. Breathing regular, unlabored. Abd soft. Moves all extremities.   Plan: - Helped her to complete Advance Directive and fully discussed although she cannot see all aspects were discussed and she noted her decisions with "x." - Plans for SNF rehab. Outpatient palliative recommended. Anticipate need for long term care.   25 min  Vinie Sill, NP Palliative Medicine Team Pager [phone] (Please see [email or web address] for schedule) Team Phone [phone]    Greater than 50%  of this time was spent counseling and coordinating care related to the above assessment and plan

## 2021-05-18 NOTE — TOC Progression Note (Signed)
CSW called for EMS transport due to no answer with [phone]. Med necessity printed to the floor.

## 2021-05-18 NOTE — Progress Notes (Signed)
Patient was found in respiratory distress need 10 liters high flow to stabilized at 95%. Went to see patient around 5pm, found patient was tachypnic. Physical exam, significant diminished breathing sound B/L on exhalation. Chest xray showed no significant changes but mild worsening of B/L pleural effusion.  Impression is patient in COPD decompensation, restart IV solumedro and one dose of Mg given. Continue breathing treatment. Patient also complains about increasing sputums production, given the xray finding, will give one dose of Lasix.

## 2021-05-18 NOTE — Care Management Important Message (Signed)
Important Message  Patient Details  Name: Meagan Holt MRN: [id number] Date of Birth: 10-29-1940   Medicare Important Message Given:  Yes     Tommy Medal 05/18/2021, 12:11 PM

## 2021-05-18 NOTE — Progress Notes (Signed)
@   1615 upon entering room patient found sitting in wheelchair with complaints of SOB and O2 sat reading 85% on 7L HFNC. RN at bedside. Respiratory called. MD informed. ABG obtained STAT chest xray ordered. EKG obtained. Patient no longer feeling short of breath brought back to bed currently back on 7L HFNC with no-rebreather. Respiratory at bedside. Will continue to monitor patient closely.

## 2021-05-18 NOTE — Progress Notes (Signed)
Manufacturing engineer [facility])  Hospital Liaison RN note         Notified by [place] of patient/family request for [facility] Palliative services at [place] after discharge.              [place] Palliative team will follow up with patient after discharge.         Please call with any hospice or palliative related questions.         Thank you for the opportunity to participate in this patient's care.     Domenic Moras, BSN, RN [facility] Liaison (listed on [employer] under Hospice[facility])    [phone] (24h on call)

## 2021-05-18 NOTE — Progress Notes (Signed)
Called report to April manager at facility patient is going to. Waiting on transport to arrive to pick patient up by wheelchair.

## 2021-05-18 NOTE — Progress Notes (Signed)
Patient seen and examined, no changes from discharge summary yesterday -Plan for discharge to SNF with palliative care  Domenic Polite, MD

## 2021-05-18 NOTE — Progress Notes (Signed)
Attempted to call [phone] left message to set transport up NA x2

## 2021-05-19 ENCOUNTER — Inpatient Hospital Stay (HOSPITAL_COMMUNITY): Payer: Medicare Other

## 2021-05-19 DIAGNOSIS — Z66 Do not resuscitate: Secondary | ICD-10-CM

## 2021-05-19 DIAGNOSIS — J441 Chronic obstructive pulmonary disease with (acute) exacerbation: Secondary | ICD-10-CM | POA: Diagnosis not present

## 2021-05-19 DIAGNOSIS — R0603 Acute respiratory distress: Secondary | ICD-10-CM

## 2021-05-19 DIAGNOSIS — Z515 Encounter for palliative care: Secondary | ICD-10-CM | POA: Diagnosis not present

## 2021-05-19 DIAGNOSIS — Z7189 Other specified counseling: Secondary | ICD-10-CM | POA: Diagnosis not present

## 2021-05-19 LAB — BASIC METABOLIC PANEL
Anion gap: 6 (ref 5–15)
BUN: 23 mg/dL (ref 8–23)
CO2: 37 mmol/L — ABNORMAL HIGH (ref 22–32)
Calcium: 8.6 mg/dL — ABNORMAL LOW (ref 8.9–10.3)
Chloride: 87 mmol/L — ABNORMAL LOW (ref 98–111)
Creatinine, Ser: 0.78 mg/dL (ref 0.44–1.00)
GFR, Estimated: >60 mL/min (ref >60)
Glucose, Bld: 311 mg/dL — ABNORMAL HIGH (ref 70–99)
Potassium: 4.5 mmol/L (ref 3.5–5.1)
Sodium: 130 mmol/L — ABNORMAL LOW (ref 135–145)

## 2021-05-19 LAB — ECHOCARDIOGRAM COMPLETE
AR max vel: 1.75 cm2
AV Area VTI: 1.79 cm2
AV Area mean vel: 1.65 cm2
AV Mean grad: 6 mmHg
AV Peak grad: 10 mmHg
Ao pk vel: 1.58 m/s
Area-P 1/2: 5.5 cm2
Height: 66 in
MV VTI: 2.62 cm2
S' Lateral: 3.5 cm
Weight: 2765.45 oz

## 2021-05-19 LAB — GLUCOSE, CAPILLARY
Glucose-Capillary: 280 mg/dL — ABNORMAL HIGH (ref 70–99)
Glucose-Capillary: 206 mg/dL — ABNORMAL HIGH (ref 70–99)
Glucose-Capillary: 252 mg/dL — ABNORMAL HIGH (ref 70–99)

## 2021-05-19 LAB — CBC WITH DIFFERENTIAL/PLATELET
Abs Immature Granulocytes: 0.23 10*3/uL — ABNORMAL HIGH (ref 0.00–0.07)
Basophils Absolute: 0.1 10*3/uL (ref 0.0–0.1)
Basophils Relative: 0 %
Eosinophils Absolute: 0 10*3/uL (ref 0.0–0.5)
Eosinophils Relative: 0 %
HCT: 45 % (ref 36.0–46.0)
Hemoglobin: 13.9 g/dL (ref 12.0–15.0)
Immature Granulocytes: 1 %
Lymphocytes Relative: 2 %
Lymphs Abs: 0.4 10*3/uL — ABNORMAL LOW (ref 0.7–4.0)
MCH: 30.5 pg (ref 26.0–34.0)
MCHC: 30.9 g/dL (ref 30.0–36.0)
MCV: 98.7 fL (ref 80.0–100.0)
Monocytes Absolute: 0.1 10*3/uL (ref 0.1–1.0)
Monocytes Relative: 0 %
Neutro Abs: 17.9 10*3/uL — ABNORMAL HIGH (ref 1.7–7.7)
Neutrophils Relative %: 97 %
Platelets: 232 10*3/uL (ref 150–400)
RBC: 4.56 MIL/uL (ref 3.87–5.11)
RDW: 13.9 % (ref 11.5–15.5)
WBC: 18.6 10*3/uL — ABNORMAL HIGH (ref 4.0–10.5)
nRBC: 0 % (ref 0.0–0.2)

## 2021-05-19 LAB — HEMOGLOBIN A1C
Hgb A1c MFr Bld: 5.6 % (ref 4.8–5.6)
Mean Plasma Glucose: 114.02 mg/dL

## 2021-05-19 LAB — MAGNESIUM: Magnesium: 2.3 mg/dL (ref 1.7–2.4)

## 2021-05-19 MED ORDER — FUROSEMIDE 10 MG/ML IJ SOLN
40.0000 mg | Freq: Once | INTRAMUSCULAR | Status: AC
  Administered 2021-05-19: 40 mg via INTRAVENOUS
  Filled 2021-05-19: qty 4

## 2021-05-19 MED ORDER — ORAL CARE MOUTH RINSE
15.0000 mL | Freq: Two times a day (BID) | OROMUCOSAL | Status: DC
  Administered 2021-05-19 – 2021-05-24 (×9): 15 mL via OROMUCOSAL

## 2021-05-19 MED ORDER — GABAPENTIN 300 MG PO CAPS
300.0000 mg | ORAL_CAPSULE | Freq: Two times a day (BID) | ORAL | Status: DC
  Administered 2021-05-19 – 2021-05-21 (×5): 300 mg via ORAL
  Filled 2021-05-19 (×5): qty 1

## 2021-05-19 MED ORDER — INSULIN ASPART 100 UNIT/ML IJ SOLN
0.0000 [IU] | Freq: Three times a day (TID) | INTRAMUSCULAR | Status: DC
  Administered 2021-05-19: 8 [IU] via SUBCUTANEOUS
  Administered 2021-05-19: 5 [IU] via SUBCUTANEOUS
  Administered 2021-05-20 (×2): 3 [IU] via SUBCUTANEOUS
  Administered 2021-05-20: 5 [IU] via SUBCUTANEOUS
  Administered 2021-05-21 (×3): 3 [IU] via SUBCUTANEOUS
  Administered 2021-05-22: 2 [IU] via SUBCUTANEOUS
  Administered 2021-05-22 (×2): 5 [IU] via SUBCUTANEOUS
  Administered 2021-05-23: 3 [IU] via SUBCUTANEOUS

## 2021-05-19 NOTE — Plan of Care (Signed)
  Problem: Education: Goal: Knowledge of General Education information will improve Description: Including pain rating scale, medication(s)/side effects and non-pharmacologic comfort measures Outcome: Progressing   Problem: Elimination: Goal: Will not experience complications related to urinary retention Outcome: Progressing   

## 2021-05-19 NOTE — Progress Notes (Addendum)
PROGRESS NOTE    GLORIANN YONKE  [id number] DOB: August 03, 1941 DOA: 05/11/2021 PCP: [name], MD    Brief Narrative:  80 year old female with history of COPD, chronic hypoxic respiratory failure on 2 L oxygen at home, chronic atrial fibrillation and depression, blindness brought to the emergency room when she was found by her caretaker sitting in her own urine and feces, extremely debilitated and unable to ambulate.  Patient herself reported that she had not been eating for few days and had multiple loose stools.  She lives at an apartment, independent living.  Uses 2 L oxygen at home.  EMS recorded oxygen of 70% on arrival but not using oxygen.  Admitted as COPD exacerbation, frailty and debility.   Assessment & Plan:   Acute hypoxic respiratory failure Severe COPD exacerbation Acute on chronic diastolic CHF -Clinically improved steroids, bronchodilators, antibiotics -Discharge was planned on 9/14, same evening became acutely dyspneic with extremely poor air movement, chest x-ray was relatively unchanged except for mild pleural effusion -Transferred to the ICU yesterday, started on high-dose IV steroids, antibiotics, nebs, BiPAP was planned however she improved before this was added -Continue IV Lasix today -Clinical suspicion for acute PE is low as she is on therapeutic doses of Xarelto -Improving rapidly, cut down steroids tomorrow, transfer to telemetry in am -Unfortunately has extremely limited pulmonary reserves  Advanced COPD Ongoing tobacco abuse -Stable, counseled  Hypokalemia and hypomagnesemia:  -Replaced  Chronic atrial fibrillation:  -Rate controlled on beta-blockers.,  Xarelto  RUL nodule -Seen by pulmonary, unclear determine whether this could be malignancy, recommended conservative approach, surveillance in the setting of advanced COPD, age and frailty, recommended pulmonary office follow-up in a few weeks to reassess  Chronic iron deficiency anemia:  -was  given iron this admission, hemoglobin improved  Delirium: Reported developed delirium while hospitalized.  -Likely secondary to steroids and withdrawal of meds including Wellbutrin, opiates and gabapentin, this was received, mental status back to baseline   Deconditioning Physical debility and frailty: Work with PT OT.   -Plan for SNF at discharge, complete 48 hours if respiratory status continues to improve    Goals: DNR now, seen by palliative care this admission, plan for palliative care follow-up after discharge  DVT prophylaxis:  rivaroxaban (XARELTO) tablet 20 mg   Code Status: DNR Family Communication: None Disposition Plan: Status is: Inpatient  Remains inpatient appropriate because:Inpatient level of care appropriate due to severity of illness  Dispo: The patient is from: Home              Anticipated d/c is to: SNF              Patient currently is not medically stable to d/c.   Difficult to place patient No  Consultants:  Pulm  Procedures:  None  Antimicrobials:  None   Subjective: -Yesterday evening had respiratory distress and worsening hypoxia, requiring 15 L high flow nasal cannula and nonrebreather mask, was transferred to the ICU  Objective: Vitals:   05/19/21 0657 05/19/21 0800 05/19/21 0956 05/19/21 1104  BP:  106/77 106/68   Pulse: (!) 57 80 90 100  Resp: 20 13    Temp: (!) 97.4 F (36.3 C)   97.6 F (36.4 C)  TempSrc: Oral   Oral  SpO2: 91% 94%  (!) 89%  Weight:      Height:        Intake/Output Summary (Last 24 hours) at 05/19/2021 1208 Last data filed at 05/19/2021 0625 Gross per 24 hour  Intake 1250 ml  Output 2250 ml  Net -1000 ml   Filed Weights   05/12/21 1600 05/16/21 0447  Weight: 90.7 kg 78.4 kg    Examination:  General: Obese chronically ill female, sitting up in bed, awake alert oriented x3, no distress HEENT: Neck obese unable to assess JVD CVS: S1-S2, irregularly irregular rhythm Lungs: Improved air movement, no  expiratory wheezes noted today, decreased at the bases Abdomen: Soft, obese, nontender, bowel sounds present  extremities: No edema  skin: No rash on exposed skin   Data Reviewed: I have personally reviewed following labs and imaging studies  CBC: Recent Labs  Lab 05/16/21 0626 05/17/21 0641 05/18/21 0631 05/19/21 0427  WBC 11.7* 13.1* 16.6* 18.6*  NEUTROABS 8.7* 10.4* 13.7* 17.9*  HGB 14.8 15.2* 13.3 13.9  HCT 48.0* 50.2* 44.6 45.0  MCV 97.8 99.8 100.5* 98.7  PLT 246 274 241 [id number]   Basic Metabolic Panel: Recent Labs  Lab 05/15/21 0556 05/16/21 0626 05/17/21 0641 05/18/21 0631 05/19/21 0427  NA 139 134* 139 134* 130*  K 3.1* 2.9* 5.1 4.8 4.5  CL 83* 79* 87* 86* 87*  CO2 42* 42* 41* 36* 37*  GLUCOSE 93 106* 123* 94 311*  BUN 15 16 25* 27* 23  CREATININE 0.53 0.59 0.73 0.74 0.78  CALCIUM 8.7* 8.2* 9.2 8.5* 8.6*  MG 1.4* 1.6* 2.4 2.0 2.3  PHOS  --   --  4.4  --   --    GFR: Estimated Creatinine Clearance: 57.3 mL/min (by C-G formula based on SCr of 0.78 mg/dL). Liver Function Tests: No results for input(s): AST, ALT, ALKPHOS, BILITOT, PROT, ALBUMIN in the last 168 hours.  No results for input(s): LIPASE, AMYLASE in the last 168 hours. No results for input(s): AMMONIA in the last 168 hours. Coagulation Profile: No results for input(s): INR, PROTIME in the last 168 hours. Cardiac Enzymes: No results for input(s): CKTOTAL, CKMB, CKMBINDEX, TROPONINI in the last 168 hours. BNP (last 3 results) No results for input(s): PROBNP in the last 8760 hours. HbA1C: No results for input(s): HGBA1C in the last 72 hours. CBG: Recent Labs  Lab 05/13/21 1616 05/13/21 2141 05/14/21 0723 05/14/21 1111 05/19/21 1120  GLUCAP 113* 137* 117* 119* 280*   Lipid Profile: No results for input(s): CHOL, HDL, LDLCALC, TRIG, CHOLHDL, LDLDIRECT in the last 72 hours. Thyroid Function Tests: No results for input(s): TSH, T4TOTAL, FREET4, T3FREE, THYROIDAB in the last 72 hours. Anemia  Panel: No results for input(s): VITAMINB12, FOLATE, FERRITIN, TIBC, IRON, RETICCTPCT in the last 72 hours. Sepsis Labs: No results for input(s): PROCALCITON, LATICACIDVEN in the last 168 hours.  Recent Results (from the past 240 hour(s))  Resp Panel by RT-PCR (Flu A&B, Covid) Nasopharyngeal Swab     Status: None   Collection Time: 05/11/21  2:50 PM   Specimen: Nasopharyngeal Swab; Nasopharyngeal(NP) swabs in vial transport medium  Result Value Ref Range Status   SARS Coronavirus 2 by RT PCR NEGATIVE NEGATIVE Final    Comment: (NOTE) SARS-CoV-2 target nucleic acids are NOT DETECTED.  The SARS-CoV-2 RNA is generally detectable in upper respiratory specimens during the acute phase of infection. The lowest concentration of SARS-CoV-2 viral copies this assay can detect is 138 copies/mL. A negative result does not preclude SARS-Cov-2 infection and should not be used as the sole basis for treatment or other patient management decisions. A negative result may occur with  improper specimen collection/handling, submission of specimen other than nasopharyngeal swab, presence of viral mutation(s) within the  areas targeted by this assay, and inadequate number of viral copies(<138 copies/mL). A negative result must be combined with clinical observations, patient history, and epidemiological information. The expected result is Negative.  Fact Sheet for Patients:  EntrepreneurPulse.com.au  Fact Sheet for Healthcare Providers:  IncredibleEmployment.be  This test is no t yet approved or cleared by the [place] FDA and  has been authorized for detection and/or diagnosis of SARS-CoV-2 by FDA under an Emergency Use Authorization (EUA). This EUA will remain  in effect (meaning this test can be used) for the duration of the COVID-19 declaration under Section 564(b)(1) of the Act, 21 U.S.C.section 360bbb-3(b)(1), unless the authorization is terminated  or  revoked sooner.       Influenza A by PCR NEGATIVE NEGATIVE Final   Influenza B by PCR NEGATIVE NEGATIVE Final    Comment: (NOTE) The Xpert Xpress SARS-CoV-2/FLU/RSV plus assay is intended as an aid in the diagnosis of influenza from Nasopharyngeal swab specimens and should not be used as a sole basis for treatment. Nasal washings and aspirates are unacceptable for Xpert Xpress SARS-CoV-2/FLU/RSV testing.  Fact Sheet for Patients: EntrepreneurPulse.com.au  Fact Sheet for Healthcare Providers: IncredibleEmployment.be  This test is not yet approved or cleared by the [place] FDA and has been authorized for detection and/or diagnosis of SARS-CoV-2 by FDA under an Emergency Use Authorization (EUA). This EUA will remain in effect (meaning this test can be used) for the duration of the COVID-19 declaration under Section 564(b)(1) of the Act, 21 U.S.C. section 360bbb-3(b)(1), unless the authorization is terminated or revoked.  Performed at [facility] : [facility], [address] [place], [place] [address]   SARS CORONAVIRUS 2 (TAT 6-24 HRS) Nasopharyngeal Nasopharyngeal Swab     Status: None   Collection Time: 05/16/21  1:20 PM   Specimen: Nasopharyngeal Swab  Result Value Ref Range Status   SARS Coronavirus 2 NEGATIVE NEGATIVE Final    Comment: (NOTE) SARS-CoV-2 target nucleic acids are NOT DETECTED.  The SARS-CoV-2 RNA is generally detectable in upper and lower respiratory specimens during the acute phase of infection. Negative results do not preclude SARS-CoV-2 infection, do not rule out co-infections with other pathogens, and should not be used as the sole basis for treatment or other patient management decisions. Negative results must be combined with clinical observations, patient history, and epidemiological information. The expected result is Negative.  Fact Sheet for Patients: SugarRoll.be  Fact Sheet for  Healthcare Providers: https://www.woods-mathews.com/  This test is not yet approved or cleared by the [place] FDA and  has been authorized for detection and/or diagnosis of SARS-CoV-2 by FDA under an Emergency Use Authorization (EUA). This EUA will remain  in effect (meaning this test can be used) for the duration of the COVID-19 declaration under Se ction 564(b)(1) of the Act, 21 U.S.C. section 360bbb-3(b)(1), unless the authorization is terminated or revoked sooner.  Performed at [facility], [place] [address], [place], [place] [address]          Radiology Studies: DG CHEST PORT 1 VIEW  Result Date: 05/18/2021 CLINICAL DATA:  Shortness of breath, history of COPD EXAM: PORTABLE CHEST 1 VIEW COMPARISON:  Chest radiograph 05/14/2021 FINDINGS: The heart is mildly enlarged, unchanged. The mediastinal contours are stable. Lung volumes are low. Small bilateral pleural effusions are again seen, minimally increased in size with adjacent airspace disease likely reflecting atelectasis. Increased interstitial markings throughout both lungs are not significantly changed. Otherwise, there is no new focal airspace disease. There is no pneumothorax.  The known right upper lobe nodule is not seen on the current study. There is no acute osseous abnormality. There is multilevel degenerative change of the spine. IMPRESSION: 1. Slight interval increase in size of small bilateral pleural effusion since 05/14/2021, with adjacent airspace disease likely reflecting atelectasis. 2. Increased interstitial markings throughout both lungs are not significantly changed. 3. The known suspicious right upper lobe nodule is not seen on the current study. Electronically Signed   By: Valetta Mole M.D.   On: 05/18/2021 17:14        Scheduled Meds:  albuterol  2.5 mg Nebulization BID   amoxicillin-clavulanate  1 tablet Oral Q12H   buPROPion  300 mg Oral QHS   Chlorhexidine Gluconate Cloth  6 each  Topical Daily   feeding supplement  237 mL Oral BID BM   ferrous sulfate  325 mg Oral Q breakfast   furosemide  40 mg Intravenous Once   gabapentin  300 mg Oral BID   insulin aspart  0-15 Units Subcutaneous TID WC   methylPREDNISolone (SOLU-MEDROL) injection  80 mg Intravenous Q12H   metoprolol succinate  12.5 mg Oral Daily   mometasone-formoterol  2 puff Inhalation BID   rivaroxaban  20 mg Oral Daily   umeclidinium bromide  1 puff Inhalation Daily   Continuous Infusions:   LOS: 7 days    Time spent: 40 minutes    Domenic Polite, MD [facility]

## 2021-05-19 NOTE — Progress Notes (Signed)
Inpatient Diabetes Program Recommendations  AACE/ADA: New Consensus Statement on Inpatient Glycemic Control   Target Ranges:  Prepandial:   less than 140 mg/dL      Peak postprandial:   less than 180 mg/dL (1-2 hours)      Critically ill patients:  140 - 180 mg/dL   Results for EVADNA, EMANUEL (MRN [id number]) as of 05/19/2021 10:19  Ref. Range 05/15/2021 05:56 05/16/2021 06:26 05/17/2021 06:41 05/18/2021 06:31 05/19/2021 04:27  Glucose Latest Ref Range: 70 - 99 mg/dL 93 106  123 (H) 94 311 (H)    Review of Glycemic Control  Diabetes history: NO Outpatient Diabetes medications: NA Current orders for Inpatient glycemic control: None; Solumedrol 80 mg Q12H  Inpatient Diabetes Program Recommendations:    Insulin: Lab glucose 311 mg/dl at 4:27 am today. Anticipate steroids cause of hyperglycemia. While ordered steroids, please consider ordering Novolog 0-9 units TID with meals and HS.  Thanks, Barnie Alderman, RN, MSN, CDE Diabetes Coordinator Inpatient Diabetes Program [phone] (Team Pager from 8am to 5pm)

## 2021-05-19 NOTE — Evaluation (Signed)
Clinical/Bedside Swallow Evaluation Patient Details  Name: Meagan Holt MRN: [id number] Date of Birth: [birth year]  Today's Date: 05/19/2021 Time: SLP Start Time (ACUTE ONLY): 1400 SLP Stop Time (ACUTE ONLY): 1424 SLP Time Calculation (min) (ACUTE ONLY): 24 min  Past Medical History:  Past Medical History:  Diagnosis Date   Arthritis    "all my joints" (07/30/2013)   Chronic back pain    Chronic bronchitis ([place])    "get it q year" (07/30/2013)   COPD (chronic obstructive pulmonary disease) ([place])    Depression    Falls frequently    "fell twice in the last 2 days" (07/30/2013)   GERD (gastroesophageal reflux disease)    "rarely" (07/30/2013)   H/O hiatal hernia    History of blood transfusion    Kidney stones    Stroke [facility])    Past Surgical History:  Past Surgical History:  Procedure Laterality Date   APPENDECTOMY     BACK SURGERY     "put cages in mid back; did arthroscopic OR on lower back" (11/326/2014)   BILATERAL OOPHORECTOMY Bilateral    CATARACT EXTRACTION W/ INTRAOCULAR LENS  IMPLANT, BILATERAL Bilateral    CHOLECYSTECTOMY     EXCISIONAL HEMORRHOIDECTOMY     GASTRIC BYPASS     HERNIA REPAIR     "removed my belly button too" (07/30/2013)   KIDNEY STONE SURGERY     "twice" (07/30/2013)   KNEE ARTHROSCOPY Left    TONSILLECTOMY     HPI:  80 year old female with history of COPD, chronic hypoxic respiratory failure on 2 L oxygen at home, chronic atrial fibrillation and depression, blindness brought to the emergency room when she was found by her caretaker sitting in her own urine and feces, extremely debilitated and unable to ambulate.  Patient herself reported that she had not been eating for few days and had multiple loose stools.  She lives at an apartment, independent living.  Uses 2 L oxygen at home.  EMS recorded oxygen of 70% on arrival but not using oxygen.  Admitted as COPD exacerbation, frailty and debility. Discharge was planned on 9/14, same evening became  acutely dyspneic with extremely poor air movement, chest x-ray was relatively unchanged except for mild pleural effusion. Transferred to the ICU yesterday, started on high-dose IV steroids, antibiotics, nebs, BiPAP was planned however she improved before this was added. RUL Seen by pulmonary, unclear determine whether this could be malignancy, recommended conservative approach, surveillance in the setting of advanced COPD, age and frailty, recommended pulmonary office follow-up in a few weeks to reassess. BSE requested.    Assessment / Plan / Recommendation  Clinical Impression  Clinical swallow evaluation completed at bedside. Oral motor examination is WNL. Pt wears upper dentures and has lower dentures at home. Pt has low vision. Pt with strong, but congested cough. She had consumed most of her lunch meal prior to my arrival, but agreeable to more. Pt with occasional cough (immediate x1 and delayed x2) with straw sips thin liquids. She was then encouraged to take cup sips and did not exhibit signs of aspiration. Pt indicates that she does not use a straw at home She exhibited prolonged oral prep with solids (salad) and min residuals with lettuce, but was aware and able to clear. RN indicates that she has been taking her medication whole with water without incident.   SLP reviewed aspiration precautions and correlation between respiration and swallow in setting of COPD. Pt stated that she had to have her liquids thickened at  the [facility] previously and she would "never do that again". Recommend regular textures with set up assist due to low vision and thin liquids via cup sips and no straws, po medications whole with water. Above to RN. SLP will follow during acute stay.   SLP Visit Diagnosis: Dysphagia, unspecified (R13.10)    Aspiration Risk  Mild aspiration risk    Diet Recommendation Regular;Thin liquid   Liquid Administration via: Cup;No straw Medication Administration: Whole meds with  liquid Supervision: Patient able to self feed;Intermittent supervision to cue for compensatory strategies Compensations: Small sips/bites;Lingual sweep for clearance of pocketing Postural Changes: Seated upright at 90 degrees;Remain upright for at least 30 minutes after po intake    Other  Recommendations Oral Care Recommendations: Oral care BID;Staff/trained caregiver to provide oral care Other Recommendations: Clarify dietary restrictions    Recommendations for follow up therapy are one component of a multi-disciplinary discharge planning process, led by the attending physician.  Recommendations may be updated based on patient status, additional functional criteria and insurance authorization.  Follow up Recommendations 24 hour supervision/assistance      Frequency and Duration min 2x/week  1 week       Prognosis Prognosis for Safe Diet Advancement: Fair Barriers to Reach Goals:  (previous dysphagia) Barriers/Prognosis Comment: Pt stated that she would not consider thickening liquids currently as she had to do previously      Swallow Study   General Date of Onset: 05/11/21 HPI: 80 year old female with history of COPD, chronic hypoxic respiratory failure on 2 L oxygen at home, chronic atrial fibrillation and depression, blindness brought to the emergency room when she was found by her caretaker sitting in her own urine and feces, extremely debilitated and unable to ambulate.  Patient herself reported that she had not been eating for few days and had multiple loose stools.  She lives at an apartment, independent living.  Uses 2 L oxygen at home.  EMS recorded oxygen of 70% on arrival but not using oxygen.  Admitted as COPD exacerbation, frailty and debility. Discharge was planned on 9/14, same evening became acutely dyspneic with extremely poor air movement, chest x-ray was relatively unchanged except for mild pleural effusion. Transferred to the ICU yesterday, started on high-dose IV  steroids, antibiotics, nebs, BiPAP was planned however she improved before this was added. RUL Seen by pulmonary, unclear determine whether this could be malignancy, recommended conservative approach, surveillance in the setting of advanced COPD, age and frailty, recommended pulmonary office follow-up in a few weeks to reassess. BSE requested. Type of Study: Bedside Swallow Evaluation Previous Swallow Assessment: 2018 reg/thin Diet Prior to this Study: Regular;Thin liquids Temperature Spikes Noted: No Respiratory Status: Nasal cannula History of Recent Intubation: No Behavior/Cognition: Alert;Cooperative;Pleasant mood Oral Cavity Assessment: Within Functional Limits Oral Care Completed by SLP: No Oral Cavity - Dentition: Dentures, top (has lower dentures at home) Vision:  (low vision, needs set up assist) Self-Feeding Abilities: Able to feed self Patient Positioning: Upright in bed Baseline Vocal Quality: Normal Volitional Cough: Strong;Congested Volitional Swallow: Able to elicit    Oral/Motor/Sensory Function Overall Oral Motor/Sensory Function: Within functional limits   Ice Chips Ice chips: Not tested   Thin Liquid Thin Liquid: Impaired Presentation: Cup;Straw;Self Fed Pharyngeal  Phase Impairments: Cough - Immediate (with straw sips)    Nectar Thick Nectar Thick Liquid: Not tested ("Don't even go there!" (with NTL))   Honey Thick Honey Thick Liquid: Not tested   Puree Puree: Within functional limits Presentation: Spoon   Solid  Solid: Impaired Presentation: Self Fed Oral Phase Impairments: Impaired mastication Oral Phase Functional Implications: Oral residue (Pt able to feel and remove small pieces of lettuce)     Thank you,  Genene Churn, [place] 05/19/2021,2:58 PM

## 2021-05-19 NOTE — TOC Progression Note (Addendum)
Transition of Care [facility]) - Progression Note    Patient Details  Name: Meagan Holt MRN: [id number] Date of Birth: [birth year]  [facility] CM/SW Contact  Salome Arnt, [place] Phone Number: 05/19/2021, 9:05 AM  Clinical Narrative:  Marlinda Mike updated Tressa Busman at [facility] on pt. They still anticipate having bed available for pt when medically stable.  Pt will require COVID test within 48 hours of d/c- MD notified. Patriciaann Clan with DSS updated. Per Tressa Busman, okay for weekend admission if ready. TOC will continue to follow.     Expected Discharge Plan: Skilled Nursing Facility Barriers to Discharge: Barriers Resolved  Expected Discharge Plan and Services Expected Discharge Plan: [place] In-house Referral: Clinical Social Work Discharge Planning Services: CM Consult Post Acute Care Choice: [place] Living arrangements for the past 2 months: Apartment Expected Discharge Date: 05/17/21                                     Social Determinants of Health (SDOH) Interventions    Readmission Risk Interventions Readmission Risk Prevention Plan 05/12/2021 04/16/2020  Medication Screening Complete Complete  Transportation Screening Complete Complete  Some recent data might be hidden

## 2021-05-19 NOTE — Evaluation (Signed)
Physical Therapy Evaluation Patient Details Name: Meagan Holt MRN: [id number] DOB: Dec 22, 1940 Today's Date: 05/19/2021  History of Present Illness  Meagan Holt is a 80 y.o. female with medical history significant for COPD, atrial fibrillation, depression.  Patient was brought to the ED via EMS with reports of increasing difficulty breathing, and worsening cough.  Patient reported generalized weakness, unable to ambulate and increased falls.  Patient's land lady checked on her and called EMS.  Patient was sitting in her own urine and feces.  She reports she did not hit her head when she fell.  Patient reports she has not been eating, and reports multiple loose stools over the past 2 days.  Patient lives alone.   Clinical Impression  Patient demonstrates slow labored movement for sitting up at bedside with Min guard assist and frequent rest breaks due to c/o low back pain, able to take few steps forward/backwards at bedside, but declined to transfer to chair due to c/o low back pain and awaiting medication.  Patient demonstrates fair/good return for  repositioning self when getting back in bed.  Patient will benefit from continued physical therapy in hospital and recommended venue below to increase strength, balance, endurance for safe ADLs and gait.         Recommendations for follow up therapy are one component of a multi-disciplinary discharge planning process, led by the attending physician.  Recommendations may be updated based on patient status, additional functional criteria and insurance authorization.  Follow Up Recommendations SNF    Equipment Recommendations  None recommended by PT    Recommendations for Other Services       Precautions / Restrictions Precautions Precautions: Fall;Other (comment) Precaution Comments: Patient legally blind Restrictions Weight Bearing Restrictions: No      Mobility  Bed Mobility Overal bed mobility: Needs Assistance Bed Mobility:  Supine to Sit;Sit to Supine     Supine to sit: Min guard Sit to supine: Min guard   General bed mobility comments: increased time, labored movement    Transfers Overall transfer level: Needs assistance Equipment used: Rolling walker (2 wheeled) Transfers: Sit to/from Stand Sit to Stand: Min guard         General transfer comment: slow labored movement  Ambulation/Gait Ambulation/Gait assistance: Min assist;Mod assist Gait Distance (Feet): 4 Feet Assistive device: Rolling walker (2 wheeled) Gait Pattern/deviations: Decreased step length - right;Decreased step length - left;Decreased stride length Gait velocity: decreased   General Gait Details: limited to a few steps forward/backwards at bedside due to fatigue  Stairs            Wheelchair Mobility    Modified Rankin (Stroke Patients Only)       Balance Overall balance assessment: Needs assistance Sitting-balance support: Feet supported;No upper extremity supported Sitting balance-Leahy Scale: Fair Sitting balance - Comments: fair/good seated at EOB   Standing balance support: During functional activity;Bilateral upper extremity supported Standing balance-Leahy Scale: Fair Standing balance comment: using RW                             Pertinent Vitals/Pain Pain Assessment: Faces Faces Pain Scale: Hurts little more Pain Location: low back Pain Descriptors / Indicators: Aching Pain Intervention(s): Limited activity within patient's tolerance;Monitored during session;Repositioned;Patient requesting pain meds-RN notified    Home Living Family/patient expects to be discharged to:: Private residence Living Arrangements: Alone Available Help at Discharge: Friend(s);Available PRN/intermittently Type of Home: Apartment Home Access: Level entry  Home Layout: One level Home Equipment: [place] - 4 wheels;Shower seat;Bedside commode;Grab bars - tub/shower      Prior Function Level of  Independence: Needs assistance   Gait / Transfers Assistance Needed: household Geologist, engineering  ADL's / Homemaking Assistance Needed: assisted by friends        Hand Dominance   Dominant Hand: Right    Extremity/Trunk Assessment   Upper Extremity Assessment Upper Extremity Assessment: Defer to OT evaluation    Lower Extremity Assessment Lower Extremity Assessment: Generalized weakness    Cervical / Trunk Assessment Cervical / Trunk Assessment: Normal  Communication   Communication: No difficulties  Cognition Arousal/Alertness: Awake/alert Behavior During Therapy: WFL for tasks assessed/performed;Agitated Overall Cognitive Status: Within Functional Limits for tasks assessed                                        General Comments      Exercises     Assessment/Plan    PT Assessment Patient needs continued PT services  PT Problem List Decreased strength;Decreased activity tolerance;Decreased balance;Decreased mobility       PT Treatment Interventions DME instruction;Gait training;Stair training;Functional mobility training;Therapeutic activities;Therapeutic exercise;Balance training;Patient/family education    PT Goals (Current goals can be found in the Care Plan section)  Acute Rehab PT Goals Patient Stated Goal: return home after rehab PT Goal Formulation: With patient Time For Goal Achievement: 06/02/21 Potential to Achieve Goals: Good    Frequency Min 3X/week   Barriers to discharge        Co-evaluation               AM-PAC PT "6 Clicks" Mobility  Outcome Measure Help needed turning from your back to your side while in a flat bed without using bedrails?: A Little Help needed moving from lying on your back to sitting on the side of a flat bed without using bedrails?: A Little Help needed moving to and from a bed to a chair (including a wheelchair)?: A Little Help needed standing up from a chair using your arms (e.g.,  wheelchair or bedside chair)?: A Little Help needed to walk in hospital room?: A Lot Help needed climbing 3-5 steps with a railing? : A Lot 6 Click Score: 16    End of Session Equipment Utilized During Treatment: Oxygen Activity Tolerance: Patient tolerated treatment well;Patient limited by fatigue Patient left: in bed;with call bell/phone within reach Nurse Communication: Mobility status      Time: 1015-1040 PT Time Calculation (min) (ACUTE ONLY): 25 min   Charges:   PT Evaluation $PT Eval Moderate Complexity: 1 Mod PT Treatments $Therapeutic Activity: 23-37 mins        10:54 AM, 05/19/21 Lonell Grandchild, MPT Physical Therapist with [facility] [phone] office [phone] mobile phone

## 2021-05-19 NOTE — Progress Notes (Signed)
PT Cancellation Note  Patient Details Name: Meagan Holt MRN: [id number] DOB: 06/13/1941   Cancelled Treatment:    Reason Eval/Treat Not Completed: Medical issues which prohibited therapy.  Patient transferred to a higher level of care and will need new PT consult resume therapy when patient is medically stable.  Thank you.   8:55 AM, 05/19/21 Lonell Grandchild, MPT Physical Therapist with [facility] 336 [phone] office [phone] mobile phone

## 2021-05-19 NOTE — Progress Notes (Signed)
Palliative:  HPI: 80 y.o. female  with past medical history of COPD, atrial fibrillation, stroke, blindness, GERD, kidney stones, depression admitted on 05/11/2021 with shortness of breath, weakness, falls after found at home sitting in urine and feces. Treated for COPD exacerbation.    I met today with Meagan Holt. She is awake and alert. We discussed concern with respiratory decline yesterday. She reports conflict with 2 women at the time about trying to get in bed?? I am unsure what she is recalling. I explained to her my concern with the severity of her lung disease. I also discussed with her my concern for her overall wellbeing with her poor lung function and expectations that this will continue to give her more problems and impact her health. She confirms again desire for DNR status. I discuss with her about potential of BiPAP and she would want trial of BiPAP if needed (although unlikely to tolerate this intervention). I also expressed concern for her swallowing as I noted she coughed a lot while drinking her milk yesterday. She also drank her milk very quickly and even with reminders to slow down and take her time she was very quick and impulsive. I told her that I would recommend SLP evaluation and she tells me that she is familiar and "I've been through that before." She did not give more details. She gives me permission to call and speak/update Meagan Holt.   I called and spoke with niece, Meagan Holt. I updated her on her aunts' condition and events since yesterday. Meagan Holt shares with me that her aunt has lost much weight since she saw her last. She was surprised to see her so much declined. Meagan Holt also reports concern with her home and that her apartment had mold and is not suitable for anyone to live in those circumstances. She also noted soiled sheets, dog feces/urine over home, moldy food in fridge, cigarette burns in carpet. We discussed that there is no expectation that Meagan Holt will ever be able to live on her own.  I do not anticipate much improvement, if any, in her overall health and functional status. Meagan Holt understands that her aunts time is likely very limited. We discussed her stated wishes for DNR and no aggressive/invasive work up or treatments. Meagan Holt is supportive of these measures. Meagan Holt also knows that her aunt wants to be cremated. Meagan Holt expressed concern as she was unable to find her aunts purse/wallet in her home. Unsure if this is in her hospital room perhaps or if a friend may have this?? I have asked RN to search room and belongings for wallet.   All questions/concerns addressed. Emotional support provided.   Exam: Alert, mostly oriented. Blind. Confused about telemetry wires and pulling off occasionally (she forgets). Breathing with some congestion 8L nasal cannula without distress. Abd soft. Moves all extremities.   Plan: - Continue supportive measures. BiPAP trial if needed. DNR confirmed.  - Hopes for transition to SNF rehab.   45 min  Vinie Sill, NP Palliative Medicine Team Pager [phone] (Please see [email or web address] for schedule) Team Phone [phone]    Greater than 50%  of this time was spent counseling and coordinating care related to the above assessment and plan

## 2021-05-20 DIAGNOSIS — J441 Chronic obstructive pulmonary disease with (acute) exacerbation: Secondary | ICD-10-CM | POA: Diagnosis not present

## 2021-05-20 DIAGNOSIS — E440 Moderate protein-calorie malnutrition: Secondary | ICD-10-CM | POA: Insufficient documentation

## 2021-05-20 LAB — BASIC METABOLIC PANEL
Anion gap: 9 (ref 5–15)
BUN: 34 mg/dL — ABNORMAL HIGH (ref 8–23)
CO2: 36 mmol/L — ABNORMAL HIGH (ref 22–32)
Calcium: 8.9 mg/dL (ref 8.9–10.3)
Chloride: 85 mmol/L — ABNORMAL LOW (ref 98–111)
Creatinine, Ser: 0.7 mg/dL (ref 0.44–1.00)
GFR, Estimated: >60 mL/min (ref >60)
Glucose, Bld: 163 mg/dL — ABNORMAL HIGH (ref 70–99)
Potassium: 4.1 mmol/L (ref 3.5–5.1)
Sodium: 130 mmol/L — ABNORMAL LOW (ref 135–145)

## 2021-05-20 LAB — CBC WITH DIFFERENTIAL/PLATELET
Abs Immature Granulocytes: 0.25 10*3/uL — ABNORMAL HIGH (ref 0.00–0.07)
Basophils Absolute: 0.1 10*3/uL (ref 0.0–0.1)
Basophils Relative: 0 %
Eosinophils Absolute: 0 10*3/uL (ref 0.0–0.5)
Eosinophils Relative: 0 %
HCT: 43.1 % (ref 36.0–46.0)
Hemoglobin: 13.3 g/dL (ref 12.0–15.0)
Immature Granulocytes: 1 %
Lymphocytes Relative: 3 %
Lymphs Abs: 0.7 10*3/uL (ref 0.7–4.0)
MCH: 30.2 pg (ref 26.0–34.0)
MCHC: 30.9 g/dL (ref 30.0–36.0)
MCV: 97.7 fL (ref 80.0–100.0)
Monocytes Absolute: 0.5 10*3/uL (ref 0.1–1.0)
Monocytes Relative: 3 %
Neutro Abs: 18.9 10*3/uL — ABNORMAL HIGH (ref 1.7–7.7)
Neutrophils Relative %: 93 %
Platelets: 276 10*3/uL (ref 150–400)
RBC: 4.41 MIL/uL (ref 3.87–5.11)
RDW: 13.7 % (ref 11.5–15.5)
WBC: 20.3 10*3/uL — ABNORMAL HIGH (ref 4.0–10.5)
nRBC: 0 % (ref 0.0–0.2)

## 2021-05-20 LAB — GLUCOSE, CAPILLARY
Glucose-Capillary: 167 mg/dL — ABNORMAL HIGH (ref 70–99)
Glucose-Capillary: 219 mg/dL — ABNORMAL HIGH (ref 70–99)
Glucose-Capillary: 152 mg/dL — ABNORMAL HIGH (ref 70–99)
Glucose-Capillary: 343 mg/dL — ABNORMAL HIGH (ref 70–99)

## 2021-05-20 LAB — MAGNESIUM: Magnesium: 1.8 mg/dL (ref 1.7–2.4)

## 2021-05-20 MED ORDER — INSULIN ASPART 100 UNIT/ML IJ SOLN
6.0000 [IU] | Freq: Once | INTRAMUSCULAR | Status: AC
  Administered 2021-05-20: 6 [IU] via SUBCUTANEOUS

## 2021-05-20 MED ORDER — METHYLPREDNISOLONE SODIUM SUCC 125 MG IJ SOLR
60.0000 mg | Freq: Two times a day (BID) | INTRAMUSCULAR | Status: DC
  Administered 2021-05-21: 60 mg via INTRAVENOUS
  Filled 2021-05-20: qty 2

## 2021-05-20 MED ORDER — ADULT MULTIVITAMIN W/MINERALS CH
1.0000 | ORAL_TABLET | Freq: Every day | ORAL | Status: DC
  Administered 2021-05-20 – 2021-05-24 (×5): 1 via ORAL
  Filled 2021-05-20 (×5): qty 1

## 2021-05-20 MED ORDER — FUROSEMIDE 10 MG/ML IJ SOLN
20.0000 mg | Freq: Once | INTRAMUSCULAR | Status: AC
  Administered 2021-05-20: 20 mg via INTRAVENOUS
  Filled 2021-05-20: qty 2

## 2021-05-20 NOTE — Progress Notes (Signed)
Initial Nutrition Assessment  DOCUMENTATION CODES:  Non-severe (moderate) malnutrition in context of chronic illness  INTERVENTION:  Continue Ensure BID.  Add Magic cup TID with meals, each supplement provides 290 kcal and 9 grams of protein.  Add MVI with minerals daily.  NUTRITION DIAGNOSIS:  Moderate Malnutrition related to chronic illness (COPD, CHF) as evidenced by mild fat depletion, moderate fat depletion, mild muscle depletion, moderate muscle depletion.  GOAL:  Patient will meet greater than or equal to 90% of their needs  MONITOR:  PO intake, Supplement acceptance, Labs, Weight trends, I & O's  REASON FOR ASSESSMENT:  Diagnosis    ASSESSMENT:  80 yo female with a PMH of COPD, chronic hypoxic respiratory failure on 2 L oxygen at home, chronic atrial fibrillation and depression, blindness, and s/p gastric bypass (~20 years ago) brought to the emergency room when she was found by her caretaker sitting in her own urine and feces, extremely debilitated and unable to ambulate.  Patient herself reported that she had not been eating for few days and had multiple loose stools.  She lives at an apartment, independent living.  Uses 2 L oxygen at home.  EMS recorded oxygen of 70% on arrival but not using oxygen.  Admitted as COPD exacerbation, frailty and debility. Discharge was planned on 9/14, same evening became acutely dyspneic with extremely poor air movement, chest x-ray was relatively unchanged except for mild pleural effusion  Transferred to the ICU.  Pt with varied intakes over the past 8 meals, averaging 50%, improvement with last 3 meals - 80-100% intakes recorded.  Spoke with pt at bedside. Pt having lunch, a [place] sandwich, while listening to the TV. She reports that she has been liking the food here and eating well recently.  When asked about weight loss, she referenced her gastric bypass surgery 20 years ago, but could not recall any recent weight loss.   Per Epic, pt  has lost ~27  lbs (13.6%) in the last 4 days, which is significant and severe. Pt on IV Lasix with history of acute on chronic dCHF, so this is very likely fluid weight.  Recommend continuing Ensure BID and adding Magic Cup TID and MVI with minerals daily.  Supplements: Ensure BID  Medications: reviewed; Augmentin BID PO, ferrous sulfate, SSI, Solu-Medrol BID, Norco PRN PO (given once today)  Labs: reviewed; Na 130 (L), CBG 167-280 (H)  NUTRITION - FOCUSED PHYSICAL EXAM: Flowsheet Row Most Recent Value  Orbital Region Moderate depletion  Upper Arm Region Mild depletion  Thoracic and Lumbar Region No depletion  Buccal Region Moderate depletion  Temple Region Moderate depletion  Clavicle Bone Region Mild depletion  Clavicle and Acromion Bone Region Mild depletion  Scapular Bone Region Unable to assess  Dorsal Hand Moderate depletion  Patellar Region Moderate depletion  Anterior Thigh Region Moderate depletion  Posterior Calf Region Moderate depletion  Edema (RD Assessment) Mild  [BLE]  Hair Reviewed  Eyes Reviewed  Mouth Reviewed  Skin Reviewed  Nails Reviewed   Diet Order:   Diet Order             Diet general           Diet regular Room service appropriate? Yes; Fluid consistency: Thin  Diet effective now                  EDUCATION NEEDS:  Education needs have been addressed  Skin:  Skin Assessment: Reviewed RN Assessment (Ecchymosis, abrasions, scratch marks)  Last BM:  05/16/21 -  Type 6, large  Height:  Ht Readings from Last 1 Encounters:  05/17/21 '5\' 6"'$  (1.676 m)   Weight:  Wt Readings from Last 1 Encounters:  05/16/21 78.4 kg   BMI:  Body mass index is 27.9 kg/m.  Estimated Nutritional Needs:  Kcal:  1800-2000 Protein:  85-100 grams Fluid:  >1.8 L  Derrel Nip, RD, LDN (she/her/hers) Registered Dietitian I After-Hours/Weekend Pager # in [place]

## 2021-05-20 NOTE — Care Management Important Message (Signed)
Important Message  Patient Details  Name: Meagan Holt MRN: [id number] Date of Birth: June 23, 1941   Medicare Important Message Given:  Yes     Tommy Medal 05/20/2021, 2:59 PM

## 2021-05-20 NOTE — Progress Notes (Signed)
Pt arrived to room 301 from ICU via bed. Bed alarm on for safety, Call bell within reach. Saline lock intact, flushes easily. O2 @ 2lpm [place] on. VSS. Pt denies c/o pain or SOB, states, "I feel pretty darned good right now."

## 2021-05-20 NOTE — Progress Notes (Signed)
Speech Language Pathology Treatment: Dysphagia  Patient Details Name: NERY MUZNY MRN: [id number] DOB: 01/07/1941 Today's Date: 05/20/2021 Time: [id number] SLP Time Calculation (min) (ACUTE ONLY): 20 min  Assessment / Plan / Recommendation Clinical Impression  Pt seen for a f/u dysphagia tx with intake of regular/thin liquids with delayed swallow initiation noted (d/t presbyphagia) and delayed throat clearing x1 post-intake.  Pt educated re: swallowing/breathing reciprocity during intake with slow rate, small bites/sips recommended with pt requiring min verbal cues to reiterate strategies and implement within tx session.  Independent intake of thin via cup with pt stating "I was told not to use a straw, but I usually don't use these at home." Lingual clearing prn recommended with meals with pt in agreement, but d/t limited solid intake with SLP, this was not required during session. Recommend f/u x1 for meal consumption and education while in acute setting.    HPI HPI: 80 year old female with history of COPD, chronic hypoxic respiratory failure on 2 L oxygen at home, chronic atrial fibrillation and depression, blindness brought to the emergency room when she was found by her caretaker sitting in her own urine and feces, extremely debilitated and unable to ambulate.  Patient herself reported that she had not been eating for few days and had multiple loose stools.  She lives at an apartment, independent living.  Uses 2 L oxygen at home.  EMS recorded oxygen of 70% on arrival but not using oxygen.  Admitted as COPD exacerbation, frailty and debility. Discharge was planned on 9/14, same evening became acutely dyspneic with extremely poor air movement, chest x-ray was relatively unchanged except for mild pleural effusion. Transferred to the ICU yesterday, started on high-dose IV steroids, antibiotics, nebs, BiPAP was planned however she improved before this was added. RUL Seen by pulmonary, unclear determine  whether this could be malignancy, recommended conservative approach, surveillance in the setting of advanced COPD, age and frailty, recommended pulmonary office follow-up in a few weeks to reassess. BSE requested.      SLP Plan  Continue with current plan of care      Recommendations for follow up therapy are one component of a multi-disciplinary discharge planning process, led by the attending physician.  Recommendations may be updated based on patient status, additional functional criteria and insurance authorization.    Recommendations  Diet recommendations: Regular;Thin liquid Liquids provided via: Cup;No straw Medication Administration: Whole meds with liquid Compensations: Small sips/bites;Slow rate;Lingual sweep for clearance of pocketing                Oral Care Recommendations: Oral care BID Follow up Recommendations: 24 hour supervision/assistance SLP Visit Diagnosis: Dysphagia, unspecified (R13.10) Plan: Continue with current plan of care                       Elvina Sidle, M.S., CCC-SLP  05/20/2021, 12:30 PM

## 2021-05-20 NOTE — Progress Notes (Signed)
His breath PROGRESS NOTE    Meagan Holt  [id number] DOB: 1940-12-04 DOA: 05/11/2021 PCP: [name], MD    Brief Narrative:  80 year old female with history of COPD, chronic hypoxic respiratory failure on 2 L oxygen at home, chronic atrial fibrillation and depression, blindness brought to the emergency room when she was found by her caretaker sitting in her own urine and feces, extremely debilitated and unable to ambulate.  Patient herself reported that she had not been eating for few days and had multiple loose stools.  She lives at an apartment, independent living.  Uses 2 L oxygen at home.  EMS recorded oxygen of 70% on arrival but not using oxygen.  Admitted as COPD exacerbation, frailty and debility.   Assessment & Plan:   Acute hypoxic respiratory failure Severe COPD exacerbation Acute on chronic diastolic CHF -Clinically improved steroids, bronchodilators, antibiotics -Discharge was planned on 9/14, same evening became acutely dyspneic with extremely poor air movement, chest x-ray was relatively unchanged except for mild pleural effusion -Transferred to the ICU 9/14, started on high-dose IV steroids, antibiotics, nebs, BiPAP was planned however she improved before this was added -will cut down IV steroids today, low-dose Lasix x1 -Clinical suspicion for acute PE is low as she is on therapeutic doses of Xarelto -Transfer out of ICU today -Unfortunately has extremely limited pulmonary reserves  Advanced COPD Ongoing tobacco abuse -Stable, counseled  Hypokalemia and hypomagnesemia:  -Replaced  Chronic atrial fibrillation:  -Rate controlled on beta-blockers.,  Xarelto  RUL nodule -Seen by pulmonary, unclear determine whether this could be malignancy, recommended conservative approach, surveillance in the setting of advanced COPD, age and frailty, recommended pulmonary office follow-up in a few weeks to reassess  Chronic iron deficiency anemia:  -was given iron this  admission, hemoglobin improved  Delirium: Reported developed delirium while hospitalized.  -Likely secondary to steroids and withdrawal of meds including Wellbutrin, opiates and gabapentin, this was received, mental status back to baseline   Deconditioning Physical debility and frailty: Work with PT OT.   -Plan for SNF at discharge, complete 48 hours if respiratory status continues to improve    Goals: DNR now, seen by palliative care this admission, plan for palliative care follow-up after discharge  DVT prophylaxis:  rivaroxaban (XARELTO) tablet 20 mg   Code Status: DNR Family Communication: None Disposition Plan: Status is: Inpatient  Remains inpatient appropriate because:Inpatient level of care appropriate due to severity of illness  Dispo: The patient is from: Home              Anticipated d/c is to: SNF              Patient currently is not medically stable to d/c.   Difficult to place patient No  Consultants:  Pulm Palliative medicine  Procedures:  None  Antimicrobials:  None   Subjective: -Feels better today, breathing improving, intermittent cough  Objective: Vitals:   05/20/21 0900 05/20/21 1000 05/20/21 1100 05/20/21 1200  BP:  137/79  (!) 152/84  Pulse: (!) 101 93 (!) 40 85  Resp:    (!) 28  Temp:   98.4 F (36.9 C) 98.4 F (36.9 C)  TempSrc:   Oral Oral  SpO2: 92% 93% (!) 88%   Weight:      Height:        Intake/Output Summary (Last 24 hours) at 05/20/2021 1418 Last data filed at 05/20/2021 1300 Gross per 24 hour  Intake 240 ml  Output 1000 ml  Net -760  ml   Filed Weights   05/12/21 1600 05/16/21 0447  Weight: 90.7 kg 78.4 kg    Examination:  General: Obese chronically ill female sitting up in bed, awake alert oriented x2, no distress HEENT: Neck obese, no JVD CVS: S1-S2, irregularly irregular rhythm Lungs: Improved air movement, the bases Abdomen: Soft, nontender, obese, bowel sounds present Extremities: No edema  skin: No rash on  exposed skin   Data Reviewed: I have personally reviewed following labs and imaging studies  CBC: Recent Labs  Lab 05/16/21 0626 05/17/21 0641 05/18/21 0631 05/19/21 0427 05/20/21 0357  WBC 11.7* 13.1* 16.6* 18.6* 20.3*  NEUTROABS 8.7* 10.4* 13.7* 17.9* 18.9*  HGB 14.8 15.2* 13.3 13.9 13.3  HCT 48.0* 50.2* 44.6 45.0 43.1  MCV 97.8 99.8 100.5* 98.7 97.7  PLT 246 274 241 232 [id number]   Basic Metabolic Panel: Recent Labs  Lab 05/16/21 0626 05/17/21 0641 05/18/21 0631 05/19/21 0427 05/20/21 0357  NA 134* 139 134* 130* 130*  K 2.9* 5.1 4.8 4.5 4.1  CL 79* 87* 86* 87* 85*  CO2 42* 41* 36* 37* 36*  GLUCOSE 106* 123* 94 311* 163*  BUN 16 25* 27* 23 34*  CREATININE 0.59 0.73 0.74 0.78 0.70  CALCIUM 8.2* 9.2 8.5* 8.6* 8.9  MG 1.6* 2.4 2.0 2.3 1.8  PHOS  --  4.4  --   --   --    GFR: Estimated Creatinine Clearance: 57.3 mL/min (by C-G formula based on SCr of 0.7 mg/dL). Liver Function Tests: No results for input(s): AST, ALT, ALKPHOS, BILITOT, PROT, ALBUMIN in the last 168 hours.  No results for input(s): LIPASE, AMYLASE in the last 168 hours. No results for input(s): AMMONIA in the last 168 hours. Coagulation Profile: No results for input(s): INR, PROTIME in the last 168 hours. Cardiac Enzymes: No results for input(s): CKTOTAL, CKMB, CKMBINDEX, TROPONINI in the last 168 hours. BNP (last 3 results) No results for input(s): PROBNP in the last 8760 hours. HbA1C: Recent Labs    05/19/21 0427  HGBA1C 5.6   CBG: Recent Labs  Lab 05/19/21 1120 05/19/21 1650 05/19/21 2128 05/20/21 0736 05/20/21 1112  GLUCAP 280* 206* 252* 167* 219*   Lipid Profile: No results for input(s): CHOL, HDL, LDLCALC, TRIG, CHOLHDL, LDLDIRECT in the last 72 hours. Thyroid Function Tests: No results for input(s): TSH, T4TOTAL, FREET4, T3FREE, THYROIDAB in the last 72 hours. Anemia Panel: No results for input(s): VITAMINB12, FOLATE, FERRITIN, TIBC, IRON, RETICCTPCT in the last 72 hours. Sepsis  Labs: No results for input(s): PROCALCITON, LATICACIDVEN in the last 168 hours.  Recent Results (from the past 240 hour(s))  Resp Panel by RT-PCR (Flu A&B, Covid) Nasopharyngeal Swab     Status: None   Collection Time: 05/11/21  2:50 PM   Specimen: Nasopharyngeal Swab; Nasopharyngeal(NP) swabs in vial transport medium  Result Value Ref Range Status   SARS Coronavirus 2 by RT PCR NEGATIVE NEGATIVE Final    Comment: (NOTE) SARS-CoV-2 target nucleic acids are NOT DETECTED.  The SARS-CoV-2 RNA is generally detectable in upper respiratory specimens during the acute phase of infection. The lowest concentration of SARS-CoV-2 viral copies this assay can detect is 138 copies/mL. A negative result does not preclude SARS-Cov-2 infection and should not be used as the sole basis for treatment or other patient management decisions. A negative result may occur with  improper specimen collection/handling, submission of specimen other than nasopharyngeal swab, presence of viral mutation(s) within the areas targeted by this assay, and inadequate number of viral  copies(<138 copies/mL). A negative result must be combined with clinical observations, patient history, and epidemiological information. The expected result is Negative.  Fact Sheet for Patients:  EntrepreneurPulse.com.au  Fact Sheet for Healthcare Providers:  IncredibleEmployment.be  This test is no t yet approved or cleared by the [place] FDA and  has been authorized for detection and/or diagnosis of SARS-CoV-2 by FDA under an Emergency Use Authorization (EUA). This EUA will remain  in effect (meaning this test can be used) for the duration of the COVID-19 declaration under Section 564(b)(1) of the Act, 21 U.S.C.section 360bbb-3(b)(1), unless the authorization is terminated  or revoked sooner.       Influenza A by PCR NEGATIVE NEGATIVE Final   Influenza B by PCR NEGATIVE NEGATIVE Final     Comment: (NOTE) The Xpert Xpress SARS-CoV-2/FLU/RSV plus assay is intended as an aid in the diagnosis of influenza from Nasopharyngeal swab specimens and should not be used as a sole basis for treatment. Nasal washings and aspirates are unacceptable for Xpert Xpress SARS-CoV-2/FLU/RSV testing.  Fact Sheet for Patients: EntrepreneurPulse.com.au  Fact Sheet for Healthcare Providers: IncredibleEmployment.be  This test is not yet approved or cleared by the [place] FDA and has been authorized for detection and/or diagnosis of SARS-CoV-2 by FDA under an Emergency Use Authorization (EUA). This EUA will remain in effect (meaning this test can be used) for the duration of the COVID-19 declaration under Section 564(b)(1) of the Act, 21 U.S.C. section 360bbb-3(b)(1), unless the authorization is terminated or revoked.  Performed at [facility], [address], [place], [place] [address]   SARS CORONAVIRUS 2 (TAT 6-24 HRS) Nasopharyngeal Nasopharyngeal Swab     Status: None   Collection Time: 05/16/21  1:20 PM   Specimen: Nasopharyngeal Swab  Result Value Ref Range Status   SARS Coronavirus 2 NEGATIVE NEGATIVE Final    Comment: (NOTE) SARS-CoV-2 target nucleic acids are NOT DETECTED.  The SARS-CoV-2 RNA is generally detectable in upper and lower respiratory specimens during the acute phase of infection. Negative results do not preclude SARS-CoV-2 infection, do not rule out co-infections with other pathogens, and should not be used as the sole basis for treatment or other patient management decisions. Negative results must be combined with clinical observations, patient history, and epidemiological information. The expected result is Negative.  Fact Sheet for Patients: SugarRoll.be  Fact Sheet for Healthcare Providers: https://www.woods-mathews.com/  This test is not yet approved or cleared by the [place] FDA and  has been authorized for detection and/or diagnosis of SARS-CoV-2 by FDA under an Emergency Use Authorization (EUA). This EUA will remain  in effect (meaning this test can be used) for the duration of the COVID-19 declaration under Se ction 564(b)(1) of the Act, 21 U.S.C. section 360bbb-3(b)(1), unless the authorization is terminated or revoked sooner.  Performed at [facility], [place] [address] [place], [place] [address]          Radiology Studies: DG CHEST PORT 1 VIEW  Result Date: 05/18/2021 CLINICAL DATA:  Shortness of breath, history of COPD EXAM: PORTABLE CHEST 1 VIEW COMPARISON:  Chest radiograph 05/14/2021 FINDINGS: The heart is mildly enlarged, unchanged. The mediastinal contours are stable. Lung volumes are low. Small bilateral pleural effusions are again seen, minimally increased in size with adjacent airspace disease likely reflecting atelectasis. Increased interstitial markings throughout both lungs are not significantly changed. Otherwise, there is no new focal airspace disease. There is no pneumothorax. The known right upper lobe nodule is not seen on  the current study. There is no acute osseous abnormality. There is multilevel degenerative change of the spine. IMPRESSION: 1. Slight interval increase in size of small bilateral pleural effusion since 05/14/2021, with adjacent airspace disease likely reflecting atelectasis. 2. Increased interstitial markings throughout both lungs are not significantly changed. 3. The known suspicious right upper lobe nodule is not seen on the current study. Electronically Signed   By: Valetta Mole M.D.   On: 05/18/2021 17:14   ECHOCARDIOGRAM COMPLETE  Result Date: 05/19/2021    ECHOCARDIOGRAM REPORT   Patient Name:   Meagan Holt [facility] Date of Exam: 05/19/2021 Medical Rec #:  [id number]       Height:       66.0 in Accession #:    [id number]      Weight:       172.8 lb Date of Birth:  March 23, 1941       BSA:          1.880 m Patient  Age:    3 years        BP:           106/68 mmHg Patient Gender: F               HR:           89 bpm. Exam Location:  [facility] Procedure: 2D Echo, Cardiac Doppler and Color Doppler Indications:    Acute respiratory distress  History:        Patient has prior history of Echocardiogram examinations, most                 recent 05/12/2021. COPD and Stroke, Arrythmias:Atrial                 Fibrillation, Signs/Symptoms:Syncope; Risk Factors:Tobacco abuse                 and Diabetes.  Sonographer:    Wenda Low Referring Phys: [place]  1. Left ventricular ejection fraction, by estimation, is 60 to 65%. The left ventricle has normal function. The left ventricle has no regional wall motion abnormalities. There is mild left ventricular hypertrophy. Left ventricular diastolic parameters are indeterminate.  2. RV-RA gradient 18 mmHg suggests normal RVSP. Right ventricular systolic function is low normal. The right ventricular size is moderately enlarged.  3. Left atrial size was severely dilated.  4. Right atrial size was moderately dilated.  5. There is a trivial pericardial effusion posterior to the left ventricle.  6. The mitral valve is grossly normal. Mild mitral valve regurgitation.  7. The aortic valve is tricuspid. There is moderate calcification of the aortic valve. Aortic valve regurgitation is not visualized. Mild to moderate aortic valve sclerosis/calcification is present, without any evidence of aortic stenosis. Aortic valve mean gradient measures 6.0 mmHg.  8. Unable to estimate CVP. Comparison(s): Prior images reviewed side by side. RV function similar to prior study. Unable to estimate CVP. FINDINGS  Left Ventricle: Left ventricular ejection fraction, by estimation, is 60 to 65%. The left ventricle has normal function. The left ventricle has no regional wall motion abnormalities. The left ventricular internal cavity size was normal in size. There is  mild left ventricular  hypertrophy. Left ventricular diastolic function could not be evaluated due to atrial fibrillation. Left ventricular diastolic parameters are indeterminate. Right Ventricle: RV-RA gradient 18 mmHg suggests normal RVSP. The right ventricular size is moderately enlarged. No increase in right ventricular wall thickness. Right ventricular systolic function is low normal. Left  Atrium: Left atrial size was severely dilated. Right Atrium: Right atrial size was moderately dilated. Pericardium: Trivial pericardial effusion is present. The pericardial effusion is posterior to the left ventricle. Mitral Valve: The mitral valve is grossly normal. Mild to moderate mitral annular calcification. Mild mitral valve regurgitation. MV peak gradient, 4.4 mmHg. The mean mitral valve gradient is 2.0 mmHg. Tricuspid Valve: The tricuspid valve is grossly normal. Tricuspid valve regurgitation is mild. Aortic Valve: The aortic valve is tricuspid. There is moderate calcification of the aortic valve. Aortic valve regurgitation is not visualized. Mild to moderate aortic valve sclerosis/calcification is present, without any evidence of aortic stenosis. Aortic valve mean gradient measures 6.0 mmHg. Aortic valve peak gradient measures 10.0 mmHg. Aortic valve area, by VTI measures 1.79 cm. Pulmonic Valve: The pulmonic valve was grossly normal. Pulmonic valve regurgitation is trivial. Aorta: The aortic root is normal in size and structure. Venous: Unable to estimate CVP. The inferior vena cava was not well visualized. IAS/Shunts: No atrial level shunt detected by color flow Doppler.  LEFT VENTRICLE PLAX 2D LVIDd:         4.80 cm  Diastology LVIDs:         3.50 cm  LV e' medial:    8.81 cm/s LV PW:         1.30 cm  LV E/e' medial:  11.3 LV IVS:        1.30 cm  LV e' lateral:   11.00 cm/s LVOT diam:     2.00 cm  LV E/e' lateral: 9.0 LV SV:         55 LV SV Index:   29 LVOT Area:     3.14 cm  RIGHT VENTRICLE RV Basal diam:  3.60 cm RV Mid diam:     3.90 cm RV S prime:     11.70 cm/s LEFT ATRIUM              Index       RIGHT ATRIUM           Index LA diam:        5.20 cm  2.77 cm/m  RA Area:     29.70 cm LA Vol (A2C):   168.0 ml 89.38 ml/m RA Volume:   96.30 ml  51.24 ml/m LA Vol (A4C):   95.4 ml  50.76 ml/m LA Biplane Vol: 130.0 ml 69.17 ml/m  AORTIC VALVE AV Area (Vmax):    1.75 cm AV Area (Vmean):   1.65 cm AV Area (VTI):     1.79 cm AV Vmax:           158.00 cm/s AV Vmean:          117.000 cm/s AV VTI:            0.309 m AV Peak Grad:      10.0 mmHg AV Mean Grad:      6.0 mmHg LVOT Vmax:         88.10 cm/s LVOT Vmean:        61.400 cm/s LVOT VTI:          0.176 m LVOT/AV VTI ratio: 0.57  AORTA Ao Root diam: 3.30 cm MITRAL VALVE               TRICUSPID VALVE MV Area (PHT): 5.50 cm    TR Peak grad:   18.5 mmHg MV Area VTI:   2.62 cm    TR Vmax:        215.00 cm/s  MV Peak grad:  4.4 mmHg MV Mean grad:  2.0 mmHg    SHUNTS MV Vmax:       1.05 m/s    Systemic VTI:  0.18 m MV Vmean:      61.1 cm/s   Systemic Diam: 2.00 cm MV Decel Time: 138 msec MV E velocity: 99.40 cm/s Rozann Lesches MD Electronically signed by Rozann Lesches MD Signature Date/Time: 05/19/2021/1:31:11 PM    Final         Scheduled Meds:  albuterol  2.5 mg Nebulization BID   amoxicillin-clavulanate  1 tablet Oral Q12H   buPROPion  300 mg Oral QHS   Chlorhexidine Gluconate Cloth  6 each Topical Daily   feeding supplement  237 mL Oral BID BM   ferrous sulfate  325 mg Oral Q breakfast   gabapentin  300 mg Oral BID   insulin aspart  0-15 Units Subcutaneous TID WC   mouth rinse  15 mL Mouth Rinse BID   methylPREDNISolone (SOLU-MEDROL) injection  80 mg Intravenous Q12H   metoprolol succinate  12.5 mg Oral Daily   mometasone-formoterol  2 puff Inhalation BID   multivitamin with minerals  1 tablet Oral Daily   rivaroxaban  20 mg Oral Daily   umeclidinium bromide  1 puff Inhalation Daily   Continuous Infusions:   LOS: 8 days    Time spent: 35 minutes    Domenic Polite, MD [facility]

## 2021-05-20 NOTE — Progress Notes (Signed)
Patient transferred from ICU to 301 today. Inhalers dulera / incruse misplaced.( Lost) . New inhalers obtained.

## 2021-05-20 NOTE — Evaluation (Signed)
Occupational Therapy Evaluation Patient Details Name: Meagan Holt MRN: [id number] DOB: [birth year] Today's Date: 05/20/2021   History of Present Illness Meagan Holt is a 80 y.o. female with medical history significant for COPD, atrial fibrillation, depression.  Patient was brought to the ED via EMS with reports of increasing difficulty breathing, and worsening cough.  Patient reported generalized weakness, unable to ambulate and increased falls.  Patient's land lady checked on her and called EMS.  Patient was sitting in her own urine and feces.  She reports she did not hit her head when she fell.  Patient reports she has not been eating, and reports multiple loose stools over the past 2 days.  Patient lives alone.   Clinical Impression    Pt agreeable to OT evaluation. Pt demonstrates supine to sit and sit to supine bed mobility with Min G assist. Pt demonstrates B UE shoulder ROM limitations, which are likely at baseline due to history of arthritis. Pt required min G to min A using RW for SPT from EOB to [facility]. Pt able to stand while being assisted with peri-care after use of toilet. Pt will benefit from continued OT in the hospital and recommended venue below to increase strength, balance, and endurance for safe ADL's.       Recommendations for follow up therapy are one component of a multi-disciplinary discharge planning process, led by the attending physician.  Recommendations may be updated based on patient status, additional functional criteria and insurance authorization.   Follow Up Recommendations  SNF    Equipment Recommendations  None recommended by OT           Precautions / Restrictions Precautions Precautions: Fall;Other (comment) Precaution Comments: Patient legally blind Restrictions Weight Bearing Restrictions: No      Mobility Bed Mobility Overal bed mobility: Needs Assistance Bed Mobility: Supine to Sit;Sit to Supine     Supine to sit: Min guard Sit to  supine: Min guard   General bed mobility comments: increased time, labored movement    Transfers Overall transfer level: Needs assistance Equipment used: Rolling walker (2 wheeled) Transfers: Sit to/from [facility] Sit to Stand: Min guard;Min assist Stand pivot transfers: Min guard;Min assist       General transfer comment: cuing needed for safe use of RW    Balance Overall balance assessment: Needs assistance Sitting-balance support: Feet supported;No upper extremity supported Sitting balance-Leahy Scale: Fair Sitting balance - Comments: fair/good seated at EOB   Standing balance support: During functional activity;Bilateral upper extremity supported Standing balance-Leahy Scale: Fair Standing balance comment: using RW                           ADL either performed or assessed with clinical judgement   ADL Overall ADL's : Needs assistance/impaired                         Toilet Transfer: Min guard;Minimal Electronics engineer Details (indicate cue type and reason): Cuing to use RW safey for EOB to [facility] transfer. Toileting- Clothing Manipulation and Hygiene: Maximal assistance;Moderate assistance;Sit to/from stand Toileting - Clothing Manipulation Details (indicate cue type and reason): Pt able to stand at [facility] while this therapist assisted with per-care.             Vision Baseline Vision/History: 2 Legally blind Ability to See in Adequate Light: 4 Severely impaired Patient Visual Report: No change from baseline  Pertinent Vitals/Pain Pain Assessment: 0-10 Pain Score: 7  Pain Location: low back; R knee Pain Descriptors / Indicators:  (sickening) Pain Intervention(s): Limited activity within patient's tolerance;Monitored during session;Repositioned     Hand Dominance Right   Extremity/Trunk Assessment Upper Extremity Assessment Upper Extremity Assessment: RUE deficits/detail;LUE  deficits/detail RUE Deficits / Details: Limited to ~75% avalable range for A/ROM and P/ROM for shoulder flexion and abduction. Pt reports history of arthritis. Gnerally weak below elbow. RUE Coordination: WNL LUE Deficits / Details: Pt demonstrates ~50 to 75% of A/ROM for shoulder flexion and abduction. P/ROM limited to ~90* while pt supine in bed with HOB elevated. General weakness elbow, wrist, and grip. LUE Coordination: WNL   Lower Extremity Assessment Lower Extremity Assessment: Defer to PT evaluation   Cervical / Trunk Assessment Cervical / Trunk Assessment: Normal   Communication Communication Communication: No difficulties   Cognition Arousal/Alertness: Awake/alert Behavior During Therapy: WFL for tasks assessed/performed;Agitated Overall Cognitive Status: Within Functional Limits for tasks assessed                                                Home Living Family/patient expects to be discharged to:: Private residence Living Arrangements: Alone Available Help at Discharge: Friend(s);Available PRN/intermittently Type of Home: Apartment Home Access: Level entry     Home Layout: One level     Bathroom Shower/Tub: Teacher, early years/pre: Standard Bathroom Accessibility: Yes   Home Equipment: Walker - 4 wheels;Shower seat;Bedside commode;Grab bars - tub/shower (3L O2 at home)   Additional Comments: Taken from PT note with exception of pt reporting O2 use at home      Prior Functioning/Environment Level of Independence: Needs assistance  Gait / Transfers Assistance Needed: household ambulator using Rollator ADL's / Homemaking Assistance Needed: assisted by friends   Comments: taken from PT note        OT Problem List: Decreased strength;Decreased range of motion;Decreased activity tolerance;Impaired balance (sitting and/or standing)      OT Treatment/Interventions: Self-care/ADL training;Therapeutic exercise;Patient/family  education;Balance training;Therapeutic activities    OT Goals(Current goals can be found in the care plan section) Acute Rehab OT Goals Patient Stated Goal: return home after rehab OT Goal Formulation: With patient Time For Goal Achievement: 06/03/21 Potential to Achieve Goals: Good  OT Frequency: Min 2X/week                     End of Session Equipment Utilized During Treatment: Rolling walker;Oxygen (3L O2) Nurse Communication: Mobility status  Activity Tolerance: Patient tolerated treatment well Patient left: in bed;with call bell/phone within reach  OT Visit Diagnosis: Unsteadiness on feet (R26.81);Muscle weakness (generalized) (M62.81)                Time: [id number] OT Time Calculation (min): 24 min Charges:  OT General Charges $OT Visit: 1 Visit OT Evaluation $OT Eval Low Complexity: 1 Low  Ky Rumple OT, MOT  Larey Seat 05/20/2021, 10:30 AM

## 2021-05-20 NOTE — Plan of Care (Signed)
  Problem: Acute Rehab OT Goals (only OT should resolve) Goal: Pt. Will Perform Grooming Flowsheets (Taken 05/20/2021 1032) Pt Will Perform Grooming:  standing  with min assist  with adaptive equipment Goal: Pt. Will Perform Lower Body Dressing Flowsheets (Taken 05/20/2021 1032) Pt Will Perform Lower Body Dressing:  with supervision  sitting/lateral leans  with adaptive equipment  with modified independence Goal: Pt. Will Transfer To Toilet Flowsheets (Taken 05/20/2021 1032) Pt Will Transfer to Toilet:  with modified independence  stand pivot transfer  bedside commode Goal: Pt/Caregiver Will Perform Home Exercise Program Flowsheets (Taken 05/20/2021 1032) Pt/caregiver will Perform Home Exercise Program:  Increased ROM  Both right and left upper extremity  Increased strength  With Supervision  Collins Dimaria OT, MOT

## 2021-05-21 DIAGNOSIS — J441 Chronic obstructive pulmonary disease with (acute) exacerbation: Secondary | ICD-10-CM | POA: Diagnosis not present

## 2021-05-21 LAB — CBC
HCT: 44.2 % (ref 36.0–46.0)
Hemoglobin: 13.8 g/dL (ref 12.0–15.0)
MCH: 30.2 pg (ref 26.0–34.0)
MCHC: 31.2 g/dL (ref 30.0–36.0)
MCV: 96.7 fL (ref 80.0–100.0)
Platelets: 306 10*3/uL (ref 150–400)
RBC: 4.57 MIL/uL (ref 3.87–5.11)
RDW: 13.7 % (ref 11.5–15.5)
WBC: 14.5 10*3/uL — ABNORMAL HIGH (ref 4.0–10.5)
nRBC: 0 % (ref 0.0–0.2)

## 2021-05-21 LAB — BASIC METABOLIC PANEL
Anion gap: 9 (ref 5–15)
BUN: 37 mg/dL — ABNORMAL HIGH (ref 8–23)
CO2: 34 mmol/L — ABNORMAL HIGH (ref 22–32)
Calcium: 9.1 mg/dL (ref 8.9–10.3)
Chloride: 90 mmol/L — ABNORMAL LOW (ref 98–111)
Creatinine, Ser: 0.65 mg/dL (ref 0.44–1.00)
GFR, Estimated: >60 mL/min (ref >60)
Glucose, Bld: 142 mg/dL — ABNORMAL HIGH (ref 70–99)
Potassium: 3.7 mmol/L (ref 3.5–5.1)
Sodium: 133 mmol/L — ABNORMAL LOW (ref 135–145)

## 2021-05-21 LAB — GLUCOSE, CAPILLARY
Glucose-Capillary: 102 mg/dL — ABNORMAL HIGH (ref 70–99)
Glucose-Capillary: 153 mg/dL — ABNORMAL HIGH (ref 70–99)
Glucose-Capillary: 176 mg/dL — ABNORMAL HIGH (ref 70–99)
Glucose-Capillary: 193 mg/dL — ABNORMAL HIGH (ref 70–99)
Glucose-Capillary: 214 mg/dL — ABNORMAL HIGH (ref 70–99)

## 2021-05-21 MED ORDER — METHYLPREDNISOLONE SODIUM SUCC 125 MG IJ SOLR
60.0000 mg | INTRAMUSCULAR | Status: DC
  Administered 2021-05-22: 60 mg via INTRAVENOUS
  Filled 2021-05-21: qty 2

## 2021-05-21 MED ORDER — GABAPENTIN 100 MG PO CAPS
200.0000 mg | ORAL_CAPSULE | Freq: Two times a day (BID) | ORAL | Status: DC
  Administered 2021-05-21 – 2021-05-24 (×6): 200 mg via ORAL
  Filled 2021-05-21 (×6): qty 2

## 2021-05-21 MED ORDER — FUROSEMIDE 10 MG/ML IJ SOLN
20.0000 mg | Freq: Once | INTRAMUSCULAR | Status: AC
  Administered 2021-05-21: 20 mg via INTRAVENOUS
  Filled 2021-05-21: qty 2

## 2021-05-21 NOTE — Progress Notes (Signed)
His breath PROGRESS NOTE    Meagan Holt  [id number] DOB: December 19, 1940 DOA: 05/11/2021 PCP: [name], MD    Subjective: The patient was seen and examined this morning, laying in bed comfortably, pleasantly confused, still requiring 3 L of oxygen, satting 93%, Elevated blood sugar overnight 343, insulin was given   Brief Narrative:  80 year old female with history of COPD, chronic hypoxic respiratory failure on 2 L oxygen at home, chronic atrial fibrillation and depression, blindness brought to the emergency room when she was found by her caretaker sitting in her own urine and feces, extremely debilitated and unable to ambulate.  Patient herself reported that she had not been eating for few days and had multiple loose stools.  She lives at an apartment, independent living.  Uses 2 L oxygen at home.  EMS recorded oxygen of 70% on arrival but not using oxygen.  Admitted as COPD exacerbation, frailty and debility.   Assessment & Plan:   Acute hypoxic respiratory failure Severe COPD exacerbation Acute on chronic diastolic CHF -Clinically improved steroids, bronchodilators, antibiotics -Discharge was planned on 9/14, same evening became acutely dyspneic with extremely poor air movement, chest x-ray was relatively unchanged except for mild pleural effusion -Transferred to the ICU 9/14, started on high-dose IV steroids, antibiotics, nebs, BiPAP was planned however she improved before this was added -will cut down IV steroids today, low-dose Lasix x1 -Clinical suspicion for acute PE is low as she is on therapeutic doses of Xarelto -Transfer out of ICU today -Unfortunately has extremely limited pulmonary reserves  Advanced COPD Ongoing tobacco abuse -Stable, counseled  Hypokalemia and hypomagnesemia:  -Replaced  Chronic atrial fibrillation:  -Rate controlled on beta-blockers.,  Xarelto  RUL nodule -Seen by pulmonary, unclear determine whether this could be malignancy,  recommended conservative approach, surveillance in the setting of advanced COPD, age and frailty, recommended pulmonary office follow-up in a few weeks to reassess  Chronic iron deficiency anemia:  -was given iron this admission, hemoglobin improved  Delirium: Reported developed delirium while hospitalized.  -Likely secondary to steroids and withdrawal of meds including Wellbutrin, opiates and gabapentin, this was received, mental status back to baseline   Deconditioning Physical debility and frailty: Work with PT OT.   -Plan for SNF at discharge, complete 48 hours if respiratory status continues to improve    Goals: DNR now, seen by palliative care this admission, plan for palliative care follow-up after discharge  DVT prophylaxis:  rivaroxaban (XARELTO) tablet 20 mg   Code Status: DNR Family Communication: None Disposition Plan: Status is: Inpatient  Remains inpatient appropriate because:Inpatient level of care appropriate due to severity of illness  Dispo: The patient is from: Home              Anticipated d/c is to: SNF              Patient currently is not medically stable to d/c.   Difficult to place patient No  Consultants:  Pulm Palliative medicine  Procedures:  None  Antimicrobials:  None   Objective: Vitals:   05/21/21 0632 05/21/21 0643 05/21/21 0740 05/21/21 0942  BP: (!) 127/97   124/82  Pulse: 91   76  Resp: 20   18  Temp: 98.2 F (36.8 C)   97.7 F (36.5 C)  TempSrc: Oral   Oral  SpO2: (!) 86% 93% 93% 93%  Weight:      Height:        Intake/Output Summary (Last 24 hours) at 05/21/2021 1000  Last data filed at 05/20/2021 2200 Gross per 24 hour  Intake 360 ml  Output 600 ml  Net -240 ml   Filed Weights   05/12/21 1600 05/16/21 0447  Weight: 90.7 kg 78.4 kg      Physical Exam:   General:  Awake alert oriented x1, pleasantly confused, chronically ill female laying in bed comfortable  HEENT:  Normocephalic, PERRL, otherwise with in Normal  limits   Neuro:  CNII-XII intact. , normal motor and sensation, reflexes intact   Lungs:   Clear to auscultation BL, Respirations unlabored, no wheezes / crackles  Cardio:    S1/S2, RRR, No murmure, No Rubs or Gallops   Abdomen:   Soft, non-tender, bowel sounds active all four quadrants,  no guarding or peritoneal signs.  Muscular skeletal:  Severe generalized global weakness - Limited exam - in bed, able to move all 4 extremities, 2+ pulses,  symmetric, No pitting edema  Skin:  Dry, warm to touch, negative for any Rashes,  Wounds: Please see nursing documentation         Data Reviewed: I have personally reviewed following labs and imaging studies  CBC: Recent Labs  Lab 05/16/21 0626 05/17/21 0641 05/18/21 0631 05/19/21 0427 05/20/21 0357 05/21/21 0423  WBC 11.7* 13.1* 16.6* 18.6* 20.3* 14.5*  NEUTROABS 8.7* 10.4* 13.7* 17.9* 18.9*  --   HGB 14.8 15.2* 13.3 13.9 13.3 13.8  HCT 48.0* 50.2* 44.6 45.0 43.1 44.2  MCV 97.8 99.8 100.5* 98.7 97.7 96.7  PLT 246 274 241 232 276 [id number]   Basic Metabolic Panel: Recent Labs  Lab 05/16/21 0626 05/17/21 0641 05/18/21 0631 05/19/21 0427 05/20/21 0357 05/21/21 0423  NA 134* 139 134* 130* 130* 133*  K 2.9* 5.1 4.8 4.5 4.1 3.7  CL 79* 87* 86* 87* 85* 90*  CO2 42* 41* 36* 37* 36* 34*  GLUCOSE 106* 123* 94 311* 163* 142*  BUN 16 25* 27* 23 34* 37*  CREATININE 0.59 0.73 0.74 0.78 0.70 0.65  CALCIUM 8.2* 9.2 8.5* 8.6* 8.9 9.1  MG 1.6* 2.4 2.0 2.3 1.8  --   PHOS  --  4.4  --   --   --   --   HbA1C: Recent Labs    05/19/21 0427  HGBA1C 5.6   CBG: Recent Labs  Lab 05/20/21 1112 05/20/21 1642 05/20/21 2105 05/21/21 0007 05/21/21 0709  GLUCAP 219* 152* 343* 214* 153*    Recent Results (from the past 240 hour(s))  Resp Panel by RT-PCR (Flu A&B, Covid) Nasopharyngeal Swab     Status: None   Collection Time: 05/11/21  2:50 PM   Specimen: Nasopharyngeal Swab; Nasopharyngeal(NP) swabs in vial transport medium  Result Value Ref  Range Status   SARS Coronavirus 2 by RT PCR NEGATIVE NEGATIVE Final    Comment: (NOTE) SARS-CoV-2 target nucleic acids are NOT DETECTED.  The SARS-CoV-2 RNA is generally detectable in upper respiratory specimens during the acute phase of infection. The lowest concentration of SARS-CoV-2 viral copies this assay can detect is 138 copies/mL. A negative result does not preclude SARS-Cov-2 infection and should not be used as the sole basis for treatment or other patient management decisions. A negative result may occur with  improper specimen collection/handling, submission of specimen other than nasopharyngeal swab, presence of viral mutation(s) within the areas targeted by this assay, and inadequate number of viral copies(<138 copies/mL). A negative result must be combined with clinical observations, patient history, and epidemiological information. The expected result is Negative.  Fact Sheet  for Patients:  EntrepreneurPulse.com.au  Fact Sheet for Healthcare Providers:  IncredibleEmployment.be  This test is no t yet approved or cleared by the [place] FDA and  has been authorized for detection and/or diagnosis of SARS-CoV-2 by FDA under an Emergency Use Authorization (EUA). This EUA will remain  in effect (meaning this test can be used) for the duration of the COVID-19 declaration under Section 564(b)(1) of the Act, 21 U.S.C.section 360bbb-3(b)(1), unless the authorization is terminated  or revoked sooner.       Influenza A by PCR NEGATIVE NEGATIVE Final   Influenza B by PCR NEGATIVE NEGATIVE Final    Comment: (NOTE) The Xpert Xpress SARS-CoV-2/FLU/RSV plus assay is intended as an aid in the diagnosis of influenza from Nasopharyngeal swab specimens and should not be used as a sole basis for treatment. Nasal washings and aspirates are unacceptable for Xpert Xpress SARS-CoV-2/FLU/RSV testing.  Fact Sheet for  Patients: EntrepreneurPulse.com.au  Fact Sheet for Healthcare Providers: IncredibleEmployment.be  This test is not yet approved or cleared by the [place] FDA and has been authorized for detection and/or diagnosis of SARS-CoV-2 by FDA under an Emergency Use Authorization (EUA). This EUA will remain in effect (meaning this test can be used) for the duration of the COVID-19 declaration under Section 564(b)(1) of the Act, 21 U.S.C. section 360bbb-3(b)(1), unless the authorization is terminated or revoked.  Performed at [facility], [address] [place], [place] [address]   SARS CORONAVIRUS 2 (TAT 6-24 HRS) Nasopharyngeal Nasopharyngeal Swab     Status: None   Collection Time: 05/16/21  1:20 PM   Specimen: Nasopharyngeal Swab  Result Value Ref Range Status   SARS Coronavirus 2 NEGATIVE NEGATIVE Final    Comment: (NOTE) SARS-CoV-2 target nucleic acids are NOT DETECTED.  The SARS-CoV-2 RNA is generally detectable in upper and lower respiratory specimens during the acute phase of infection. Negative results do not preclude SARS-CoV-2 infection, do not rule out co-infections with other pathogens, and should not be used as the sole basis for treatment or other patient management decisions. Negative results must be combined with clinical observations, patient history, and epidemiological information. The expected result is Negative.  Fact Sheet for Patients: SugarRoll.be  Fact Sheet for Healthcare Providers: https://www.woods-mathews.com/  This test is not yet approved or cleared by the [place] FDA and  has been authorized for detection and/or diagnosis of SARS-CoV-2 by FDA under an Emergency Use Authorization (EUA). This EUA will remain  in effect (meaning this test can be used) for the duration of the COVID-19 declaration under Se ction 564(b)(1) of the Act, 21 U.S.C. section 360bbb-3(b)(1),  unless the authorization is terminated or revoked sooner.  Performed at [facility], [place] [address], [place], [place] [address]          Radiology Studies: ECHOCARDIOGRAM COMPLETE  Result Date: 05/19/2021    ECHOCARDIOGRAM REPORT   Patient Name:   NAYLANIE MARENTES [facility] Date of Exam: 05/19/2021 Medical Rec #:  [id number]       Height:       66.0 in Accession #:    [id number]      Weight:       172.8 lb Date of Birth:  09/23/1940       BSA:          1.880 m Patient Age:    18 years        BP:           106/68 mmHg Patient Gender: F  HR:           89 bpm. Exam Location:  [facility] Procedure: 2D Echo, Cardiac Doppler and Color Doppler Indications:    Acute respiratory distress  History:        Patient has prior history of Echocardiogram examinations, most                 recent 05/12/2021. COPD and Stroke, Arrythmias:Atrial                 Fibrillation, Signs/Symptoms:Syncope; Risk Factors:Tobacco abuse                 and Diabetes.  Sonographer:    Wenda Low Referring Phys: [place]  1. Left ventricular ejection fraction, by estimation, is 60 to 65%. The left ventricle has normal function. The left ventricle has no regional wall motion abnormalities. There is mild left ventricular hypertrophy. Left ventricular diastolic parameters are indeterminate.  2. RV-RA gradient 18 mmHg suggests normal RVSP. Right ventricular systolic function is low normal. The right ventricular size is moderately enlarged.  3. Left atrial size was severely dilated.  4. Right atrial size was moderately dilated.  5. There is a trivial pericardial effusion posterior to the left ventricle.  6. The mitral valve is grossly normal. Mild mitral valve regurgitation.  7. The aortic valve is tricuspid. There is moderate calcification of the aortic valve. Aortic valve regurgitation is not visualized. Mild to moderate aortic valve sclerosis/calcification is present, without any evidence of aortic  stenosis. Aortic valve mean gradient measures 6.0 mmHg.  8. Unable to estimate CVP. Comparison(s): Prior images reviewed side by side. RV function similar to prior study. Unable to estimate CVP. FINDINGS  Left Ventricle: Left ventricular ejection fraction, by estimation, is 60 to 65%. The left ventricle has normal function. The left ventricle has no regional wall motion abnormalities. The left ventricular internal cavity size was normal in size. There is  mild left ventricular hypertrophy. Left ventricular diastolic function could not be evaluated due to atrial fibrillation. Left ventricular diastolic parameters are indeterminate. Right Ventricle: RV-RA gradient 18 mmHg suggests normal RVSP. The right ventricular size is moderately enlarged. No increase in right ventricular wall thickness. Right ventricular systolic function is low normal. Left Atrium: Left atrial size was severely dilated. Right Atrium: Right atrial size was moderately dilated. Pericardium: Trivial pericardial effusion is present. The pericardial effusion is posterior to the left ventricle. Mitral Valve: The mitral valve is grossly normal. Mild to moderate mitral annular calcification. Mild mitral valve regurgitation. MV peak gradient, 4.4 mmHg. The mean mitral valve gradient is 2.0 mmHg. Tricuspid Valve: The tricuspid valve is grossly normal. Tricuspid valve regurgitation is mild. Aortic Valve: The aortic valve is tricuspid. There is moderate calcification of the aortic valve. Aortic valve regurgitation is not visualized. Mild to moderate aortic valve sclerosis/calcification is present, without any evidence of aortic stenosis. Aortic valve mean gradient measures 6.0 mmHg. Aortic valve peak gradient measures 10.0 mmHg. Aortic valve area, by VTI measures 1.79 cm. Pulmonic Valve: The pulmonic valve was grossly normal. Pulmonic valve regurgitation is trivial. Aorta: The aortic root is normal in size and structure. Venous: Unable to estimate CVP. The  inferior vena cava was not well visualized. IAS/Shunts: No atrial level shunt detected by color flow Doppler.  LEFT VENTRICLE PLAX 2D LVIDd:         4.80 cm  Diastology LVIDs:         3.50 cm  LV e' medial:  8.81 cm/s LV PW:         1.30 cm  LV E/e' medial:  11.3 LV IVS:        1.30 cm  LV e' lateral:   11.00 cm/s LVOT diam:     2.00 cm  LV E/e' lateral: 9.0 LV SV:         55 LV SV Index:   29 LVOT Area:     3.14 cm  RIGHT VENTRICLE RV Basal diam:  3.60 cm RV Mid diam:    3.90 cm RV S prime:     11.70 cm/s LEFT ATRIUM              Index       RIGHT ATRIUM           Index LA diam:        5.20 cm  2.77 cm/m  RA Area:     29.70 cm LA Vol (A2C):   168.0 ml 89.38 ml/m RA Volume:   96.30 ml  51.24 ml/m LA Vol (A4C):   95.4 ml  50.76 ml/m LA Biplane Vol: 130.0 ml 69.17 ml/m  AORTIC VALVE AV Area (Vmax):    1.75 cm AV Area (Vmean):   1.65 cm AV Area (VTI):     1.79 cm AV Vmax:           158.00 cm/s AV Vmean:          117.000 cm/s AV VTI:            0.309 m AV Peak Grad:      10.0 mmHg AV Mean Grad:      6.0 mmHg LVOT Vmax:         88.10 cm/s LVOT Vmean:        61.400 cm/s LVOT VTI:          0.176 m LVOT/AV VTI ratio: 0.57  AORTA Ao Root diam: 3.30 cm MITRAL VALVE               TRICUSPID VALVE MV Area (PHT): 5.50 cm    TR Peak grad:   18.5 mmHg MV Area VTI:   2.62 cm    TR Vmax:        215.00 cm/s MV Peak grad:  4.4 mmHg MV Mean grad:  2.0 mmHg    SHUNTS MV Vmax:       1.05 m/s    Systemic VTI:  0.18 m MV Vmean:      61.1 cm/s   Systemic Diam: 2.00 cm MV Decel Time: 138 msec MV E velocity: 99.40 cm/s Rozann Lesches MD Electronically signed by Rozann Lesches MD Signature Date/Time: 05/19/2021/1:31:11 PM    Final         Scheduled Meds:  albuterol  2.5 mg Nebulization BID   amoxicillin-clavulanate  1 tablet Oral Q12H   buPROPion  300 mg Oral QHS   Chlorhexidine Gluconate Cloth  6 each Topical Daily   feeding supplement  237 mL Oral BID BM   ferrous sulfate  325 mg Oral Q breakfast   furosemide  20  mg Intravenous Once   gabapentin  200 mg Oral BID   insulin aspart  0-15 Units Subcutaneous TID WC   mouth rinse  15 mL Mouth Rinse BID   [START ON 05/22/2021] methylPREDNISolone (SOLU-MEDROL) injection  60 mg Intravenous Q24H   metoprolol succinate  12.5 mg Oral Daily   mometasone-formoterol  2 puff Inhalation BID   multivitamin with minerals  1 tablet  Oral Daily   rivaroxaban  20 mg Oral Daily   umeclidinium bromide  1 puff Inhalation Daily   Continuous Infusions:   LOS: 9 days    Time spent: 35 minutes    Deatra James, MD [facility]

## 2021-05-21 NOTE — Progress Notes (Signed)
Patients CBG 343. Dr. Clearence Ped notified. No HS coverage ordered. New order placed for Novolog 6 units SQ given.

## 2021-05-22 DIAGNOSIS — J441 Chronic obstructive pulmonary disease with (acute) exacerbation: Secondary | ICD-10-CM | POA: Diagnosis not present

## 2021-05-22 LAB — GLUCOSE, CAPILLARY
Glucose-Capillary: 142 mg/dL — ABNORMAL HIGH (ref 70–99)
Glucose-Capillary: 213 mg/dL — ABNORMAL HIGH (ref 70–99)
Glucose-Capillary: 202 mg/dL — ABNORMAL HIGH (ref 70–99)
Glucose-Capillary: 221 mg/dL — ABNORMAL HIGH (ref 70–99)

## 2021-05-22 MED ORDER — PREDNISONE 20 MG PO TABS
50.0000 mg | ORAL_TABLET | Freq: Every day | ORAL | Status: DC
  Administered 2021-05-22 – 2021-05-24 (×3): 50 mg via ORAL
  Filled 2021-05-22 (×3): qty 3

## 2021-05-22 MED ORDER — FUROSEMIDE 10 MG/ML IJ SOLN
20.0000 mg | Freq: Once | INTRAMUSCULAR | Status: AC
  Administered 2021-05-22: 20 mg via INTRAVENOUS
  Filled 2021-05-22: qty 2

## 2021-05-22 NOTE — Progress Notes (Signed)
His breath PROGRESS NOTE    Meagan Holt  [id number] DOB: 28-Feb-1941 DOA: 05/11/2021 PCP: [name], MD    Subjective: The patient was seen and examined this morning, laying in bed comfortably, no acute distress.  No issues overnight.  Satting 92% on 3 L of oxygen  Brief Narrative:  80 year old female with history of COPD, chronic hypoxic respiratory failure on 2 L oxygen at home, chronic atrial fibrillation and depression, blindness brought to the emergency room when she was found by her caretaker sitting in her own urine and feces, extremely debilitated and unable to ambulate.  Patient herself reported that she had not been eating for few days and had multiple loose stools.  She lives at an apartment, independent living.  Uses 2 L oxygen at home.  EMS recorded oxygen of 70% on arrival but not using oxygen.  Admitted as COPD exacerbation, frailty and debility.   Assessment & Plan:   Acute hypoxic respiratory failure Severe COPD exacerbation/ Acute on chronic diastolic CHF -Much improved, O2 demand has been trending down, now on 3 L of oxygen, satting 92%  -Clinically improved steroids, bronchodilators, antibiotics -Discharge was planned on 9/19 - chest x-ray was relatively unchanged except for mild pleural effusion -Transferred to the ICU 9/14, started on high-dose IV steroids, antibiotics, nebs,  -Patient has been switched to p.o. steroids titrating down, as needed Lasix -Clinical suspicion for acute PE is low as she is on therapeutic doses of Xarelto -Unfortunately has extremely limited pulmonary reserves  Advanced COPD/ Ongoing tobacco abuse -Stable, counseled  Hypokalemia and hypomagnesemia:  -Replaced  Chronic atrial fibrillation:  -Rate controlled on beta-blockers.,  Xarelto  RUL nodule -Seen by pulmonary, unclear determine whether this could be malignancy, recommended conservative approach, surveillance in the setting of advanced COPD, age and frailty,  recommended pulmonary office follow-up in a few weeks to reassess  Chronic iron deficiency anemia:  -was given iron this admission, hemoglobin stable,  Delirium: Reported developed delirium while hospitalized.  -Likely secondary to steroids and withdrawal of meds including Wellbutrin, opiates and gabapentin, this was received, mental status back to baseline   Deconditioning Physical debility and frailty: Work with PT OT.   -Plan for SNF at discharge, complete 48 hours if respiratory status continues to improve    Goals: DNR now, seen by palliative care this admission, plan for palliative care follow-up after discharge  DVT prophylaxis:  rivaroxaban (XARELTO) tablet 20 mg   Code Status: DNR Family Communication: None Disposition Plan: Status is: Inpatient  Remains inpatient appropriate because:Inpatient level of care appropriate due to severity of illness  Dispo: The patient is from: Home              Anticipated d/c is to: SNF -to be discharged to SNF on 05/23/2021                 Patient currently is not medically stable to d/c.   Difficult to place patient No  Consultants:  Pulm Palliative medicine  Procedures:  None  Antimicrobials:  None   Objective: Vitals:   05/21/21 1938 05/21/21 2107 05/22/21 0518 05/22/21 0828  BP:  120/80 116/83   Pulse:  84 78   Resp:  20 20   Temp:  98.6 F (37 C) 98.1 F (36.7 C)   TempSrc:  Oral Oral   SpO2: 93% 92% 95% 92%  Weight:      Height:        Intake/Output Summary (Last 24 hours) at 05/22/2021  [place] filed at 05/21/2021 2103 Gross per 24 hour  Intake 480 ml  Output 1500 ml  Net -1020 ml   Filed Weights   05/12/21 1600 05/16/21 0447  Weight: 90.7 kg 78.4 kg       Physical Exam:   General:  Alert, oriented, cooperative, no distress; pleasantly confused, oriented x1  HEENT:  Normocephalic, PERRL, otherwise with in Normal limits   Neuro:  CNII-XII intact. , normal motor and sensation, reflexes intact    Lungs:   Clear to auscultation BL, Respirations unlabored, no wheezes / crackles  Cardio:    S1/S2, RRR, No murmure, No Rubs or Gallops   Abdomen:   Soft, non-tender, bowel sounds active all four quadrants,  no guarding or peritoneal signs.  Muscular skeletal:  Limited exam - in bed, able to move all 4 extremities, global generalized weaknesses,  2+ pulses,  symmetric, No pitting edema  Skin:  Dry, warm to touch, negative for any Rashes,  Wounds: Please see nursing documentation             Data Reviewed: I have personally reviewed following labs and imaging studies  CBC: Recent Labs  Lab 05/16/21 0626 05/17/21 0641 05/18/21 0631 05/19/21 0427 05/20/21 0357 05/21/21 0423  WBC 11.7* 13.1* 16.6* 18.6* 20.3* 14.5*  NEUTROABS 8.7* 10.4* 13.7* 17.9* 18.9*  --   HGB 14.8 15.2* 13.3 13.9 13.3 13.8  HCT 48.0* 50.2* 44.6 45.0 43.1 44.2  MCV 97.8 99.8 100.5* 98.7 97.7 96.7  PLT 246 274 241 232 276 [id number]   Basic Metabolic Panel: Recent Labs  Lab 05/16/21 0626 05/17/21 0641 05/18/21 0631 05/19/21 0427 05/20/21 0357 05/21/21 0423  NA 134* 139 134* 130* 130* 133*  K 2.9* 5.1 4.8 4.5 4.1 3.7  CL 79* 87* 86* 87* 85* 90*  CO2 42* 41* 36* 37* 36* 34*  GLUCOSE 106* 123* 94 311* 163* 142*  BUN 16 25* 27* 23 34* 37*  CREATININE 0.59 0.73 0.74 0.78 0.70 0.65  CALCIUM 8.2* 9.2 8.5* 8.6* 8.9 9.1  MG 1.6* 2.4 2.0 2.3 1.8  --   PHOS  --  4.4  --   --   --   --   HbA1C: No results for input(s): HGBA1C in the last 72 hours.  CBG: Recent Labs  Lab 05/21/21 0709 05/21/21 1105 05/21/21 1605 05/21/21 2109 05/22/21 0736  GLUCAP 153* 193* 176* 102* 142*    Recent Results (from the past 240 hour(s))  SARS CORONAVIRUS 2 (TAT 6-24 HRS) Nasopharyngeal Nasopharyngeal Swab     Status: None   Collection Time: 05/16/21  1:20 PM   Specimen: Nasopharyngeal Swab  Result Value Ref Range Status   SARS Coronavirus 2 NEGATIVE NEGATIVE Final    Comment: (NOTE) SARS-CoV-2 target nucleic acids  are NOT DETECTED.  The SARS-CoV-2 RNA is generally detectable in upper and lower respiratory specimens during the acute phase of infection. Negative results do not preclude SARS-CoV-2 infection, do not rule out co-infections with other pathogens, and should not be used as the sole basis for treatment or other patient management decisions. Negative results must be combined with clinical observations, patient history, and epidemiological information. The expected result is Negative.  Fact Sheet for Patients: SugarRoll.be  Fact Sheet for Healthcare Providers: https://www.woods-mathews.com/  This test is not yet approved or cleared by the [place] FDA and  has been authorized for detection and/or diagnosis of SARS-CoV-2 by FDA under an Emergency Use Authorization (EUA). This EUA will remain  in effect (  meaning this test can be used) for the duration of the COVID-19 declaration under Se ction 564(b)(1) of the Act, 21 U.S.C. section 360bbb-3(b)(1), unless the authorization is terminated or revoked sooner.  Performed at [facility], [place] [address] [place],  [address]        Scheduled Meds:  albuterol  2.5 mg Nebulization BID   amoxicillin-clavulanate  1 tablet Oral Q12H   buPROPion  300 mg Oral QHS   Chlorhexidine Gluconate Cloth  6 each Topical Daily   feeding supplement  237 mL Oral BID BM   ferrous sulfate  325 mg Oral Q breakfast   gabapentin  200 mg Oral BID   insulin aspart  0-15 Units Subcutaneous TID WC   mouth rinse  15 mL Mouth Rinse BID   metoprolol succinate  12.5 mg Oral Daily   mometasone-formoterol  2 puff Inhalation BID   multivitamin with minerals  1 tablet Oral Daily   predniSONE  50 mg Oral Q breakfast   rivaroxaban  20 mg Oral Daily   umeclidinium bromide  1 puff Inhalation Daily   Continuous Infusions:   LOS: 10 days    Time spent: 35 minutes    Deatra James, MD [facility]

## 2021-05-23 DIAGNOSIS — J441 Chronic obstructive pulmonary disease with (acute) exacerbation: Secondary | ICD-10-CM | POA: Diagnosis not present

## 2021-05-23 LAB — GLUCOSE, CAPILLARY
Glucose-Capillary: 91 mg/dL (ref 70–99)
Glucose-Capillary: 101 mg/dL — ABNORMAL HIGH (ref 70–99)
Glucose-Capillary: 168 mg/dL — ABNORMAL HIGH (ref 70–99)
Glucose-Capillary: 113 mg/dL — ABNORMAL HIGH (ref 70–99)

## 2021-05-23 LAB — SARS CORONAVIRUS 2 (TAT 6-24 HRS): SARS Coronavirus 2: NEGATIVE

## 2021-05-23 MED ORDER — POTASSIUM CHLORIDE CRYS ER 20 MEQ PO TBCR
40.0000 meq | EXTENDED_RELEASE_TABLET | Freq: Once | ORAL | Status: AC
  Administered 2021-05-23: 40 meq via ORAL
  Filled 2021-05-23: qty 2

## 2021-05-23 NOTE — Progress Notes (Signed)
PT Cancellation Note  Patient Details Name: JEIRY JEANPHILIPPE MRN: [id number] DOB: 1941/04/12   Cancelled Treatment:    Reason Eval/Treat Not Completed: Other (comment). Pt resting upon arrival, easy to arouse. Pt declines getting OOB due to IV site discomfort. Attempted to educate pt on therapeutic process and benefit of mobility without success, RN notified. Will attempt again as schedule permits.    Tori Herman Mell PT, DPT 05/23/21, 10:28 AM

## 2021-05-23 NOTE — TOC Progression Note (Signed)
Transition of Care [facility]) - Progression Note    Patient Details  Name: Meagan Holt MRN: [id number] Date of Birth: 28-Aug-1941  Transition of Care [facility]) CM/SW Contact  Salome Arnt, [place] Phone Number: 05/23/2021, 10:02 AM  Clinical Narrative:  Anticipate d/c tomorrow per MD. Tressa Busman with [facility] admissions updated. COVID negative 9/18. Pt will need repeat COVID test if not ready tomorrow.      Expected Discharge Plan: [place] Barriers to Discharge: Continued Medical Work up  Expected Discharge Plan and Services Expected Discharge Plan: [place] In-house Referral: Clinical Social Work Discharge Planning Services: CM Consult Post Acute Care Choice: [place] Living arrangements for the past 2 months: Apartment Expected Discharge Date: 05/17/21                                     Social Determinants of Health (SDOH) Interventions    Readmission Risk Interventions Readmission Risk Prevention Plan 05/12/2021 04/16/2020  Medication Screening Complete Complete  Transportation Screening Complete Complete  Some recent data might be hidden

## 2021-05-23 NOTE — Progress Notes (Signed)
PROGRESS NOTE    Meagan Holt  [id number] DOB: 1941-01-17 DOA: 05/11/2021 PCP: [name], MD   Brief Narrative:  80 year old female with history of COPD, chronic hypoxic respiratory failure on 2 L oxygen at home, chronic atrial fibrillation and depression, blindness brought to the emergency room when she was found by her caretaker sitting in her own urine and feces, extremely debilitated and unable to ambulate.  Patient herself reported that she had not been eating for few days and had multiple loose stools.  She lives at an apartment, independent living.  Uses 2 L oxygen at home.  EMS recorded oxygen of 70% on arrival but not using oxygen.  Admitted as COPD exacerbation, frailty and debility.  Upon admission she was started on steroids, antibiotics and bronchodilators.  PT recommended SNF.  Over several days her symptoms slowly improved.  She did receive as needed Lasix during hospitalization.     Assessment & Plan:   Principal Problem:   COPD with acute exacerbation ([place]) Active Problems:   Depression   History of stroke   Acute on chronic respiratory failure with hypoxia (HCC)   Recurrent falls   Atrial fibrillation, chronic (HCC)   IDA (iron deficiency anemia)   Macrocytic anemia   Lung tumor (benign), right   Malnutrition of moderate degree  Acute hypoxic respiratory failure Severe COPD exacerbation/ Acute on chronic diastolic CHF -She is still requiring 4 L nasal cannula with bilateral diffuse coarse breath sounds.  She is not ready for discharge today.  She has very poor pulmonary reserve.  -Continue steroids, bronchodilators.  Currently she also on Augmentin. -Continue Xarelto.  As needed Lasix if necessary.  But overall her volume status appears to be better   Advanced COPD/ Ongoing tobacco abuse -counseled   Hypokalemia and hypomagnesemia:  -Improved. Replete prn   Chronic atrial fibrillation:  -Rate controlled on beta-blockers. Cont Xarelto   RUL  nodule -Seen by pulmonary, unclear determine whether this could be malignancy, recommended conservative approach, surveillance in the setting of advanced COPD, age and frailty, recommended pulmonary office follow-up in a few weeks to reassess  Chronic iron deficiency anemia:  -Hb is stable. No signs of bleeding.    Delirium: Reported developed delirium while hospitalized. Currently resolved.  -Likely secondary to steroids and withdrawal of meds including Wellbutrin, opiates and gabapentin, this was received, mental status back to baseline    Deconditioning Physical debility and frailty: Work with PT OT.   -SNF once her breathing is more stable.    Goals: DNR now, seen by palliative care this admission, plan for palliative care follow-up after discharge     DVT prophylaxis:  rivaroxaban (XARELTO) tablet 20 mg  Code Status: DNR Family Communication: Left a voicemail for her niece  Status is: Inpatient  Remains inpatient appropriate because:Inpatient level of care appropriate due to severity of illness.  Still has significantly abnormal breath sounds bilaterally  Dispo: The patient is from: Home              Anticipated d/c is to: SNF              Patient currently is not medically stable to d/c.   Difficult to place patient No    Nutritional status  Nutrition Problem: Moderate Malnutrition Etiology: chronic illness (COPD, CHF)  Signs/Symptoms: mild fat depletion, moderate fat depletion, mild muscle depletion, moderate muscle depletion  Interventions: Ensure Enlive (each supplement provides 350kcal and 20 grams of protein), Magic cup, MVI  Body mass  index is 27.9 kg/m.           Subjective: Overall feels okay but still has shortness of breath with movement.  Review of Systems Otherwise negative except as per HPI, including: General: Denies fever, chills, night sweats or unintended weight loss. Resp: Denies hemoptysis Cardiac: Denies chest pain, palpitations,  orthopnea, paroxysmal nocturnal dyspnea. GI: Denies abdominal pain, nausea, vomiting, diarrhea or constipation GU: Denies dysuria, frequency, hesitancy or incontinence MS: Denies muscle aches, joint pain or swelling Neuro: Denies headache, neurologic deficits (focal weakness, numbness, tingling), abnormal gait Psych: Denies anxiety, depression, SI/HI/AVH Skin: Denies new rashes or lesions ID: Denies sick contacts, exotic exposures, travel  Examination:  General exam: Appears calm and comfortable, 3 L nasal cannula.  Elderly frail Respiratory system: Bilateral diffuse coarse breath sounds Cardiovascular system: S1 & S2 heard, RRR. No JVD, murmurs, rubs, gallops or clicks. No pedal edema. Gastrointestinal system: Abdomen is nondistended, soft and nontender. No organomegaly or masses felt. Normal bowel sounds heard. Central nervous system: Alert and oriented. No focal neurological deficits. Extremities: Symmetric 5 x 5 power. Skin: No rashes, lesions or ulcers Psychiatry: Judgement and insight appear normal. Mood & affect appropriate.     Objective: Vitals:   05/22/21 2050 05/23/21 0549 05/23/21 0725 05/23/21 0727  BP: 130/81 110/62 119/78 119/78  Pulse: 68 (!) 42 91 91  Resp: 18 18 19 20   Temp: 97.9 F (36.6 C) 98.3 F (36.8 C) 98.5 F (36.9 C) 98.5 F (36.9 C)  TempSrc:   Oral Oral  SpO2: 95% 90% 91%   Weight:      Height:        Intake/Output Summary (Last 24 hours) at 05/23/2021 1054 Last data filed at 05/23/2021 0900 Gross per 24 hour  Intake 1320 ml  Output 1600 ml  Net -280 ml   Filed Weights   05/12/21 1600 05/16/21 0447  Weight: 90.7 kg 78.4 kg     Data Reviewed:   CBC: Recent Labs  Lab 05/17/21 0641 05/18/21 0631 05/19/21 0427 05/20/21 0357 05/21/21 0423  WBC 13.1* 16.6* 18.6* 20.3* 14.5*  NEUTROABS 10.4* 13.7* 17.9* 18.9*  --   HGB 15.2* 13.3 13.9 13.3 13.8  HCT 50.2* 44.6 45.0 43.1 44.2  MCV 99.8 100.5* 98.7 97.7 96.7  PLT 274 241 232 276 280    Basic Metabolic Panel: Recent Labs  Lab 05/17/21 0641 05/18/21 0631 05/19/21 0427 05/20/21 0357 05/21/21 0423  NA 139 134* 130* 130* 133*  K 5.1 4.8 4.5 4.1 3.7  CL 87* 86* 87* 85* 90*  CO2 41* 36* 37* 36* 34*  GLUCOSE 123* 94 311* 163* 142*  BUN 25* 27* 23 34* 37*  CREATININE 0.73 0.74 0.78 0.70 0.65  CALCIUM 9.2 8.5* 8.6* 8.9 9.1  MG 2.4 2.0 2.3 1.8  --   PHOS 4.4  --   --   --   --    GFR: Estimated Creatinine Clearance: 57.3 mL/min (by C-G formula based on SCr of 0.65 mg/dL). Liver Function Tests: No results for input(s): AST, ALT, ALKPHOS, BILITOT, PROT, ALBUMIN in the last 168 hours. No results for input(s): LIPASE, AMYLASE in the last 168 hours. No results for input(s): AMMONIA in the last 168 hours. Coagulation Profile: No results for input(s): INR, PROTIME in the last 168 hours. Cardiac Enzymes: No results for input(s): CKTOTAL, CKMB, CKMBINDEX, TROPONINI in the last 168 hours. BNP (last 3 results) No results for input(s): PROBNP in the last 8760 hours. HbA1C: No results for input(s): HGBA1C in  the last 72 hours. CBG: Recent Labs  Lab 05/22/21 0736 05/22/21 1058 05/22/21 1619 05/22/21 2052 05/23/21 0717  GLUCAP 142* 213* 202* 221* 91   Lipid Profile: No results for input(s): CHOL, HDL, LDLCALC, TRIG, CHOLHDL, LDLDIRECT in the last 72 hours. Thyroid Function Tests: No results for input(s): TSH, T4TOTAL, FREET4, T3FREE, THYROIDAB in the last 72 hours. Anemia Panel: No results for input(s): VITAMINB12, FOLATE, FERRITIN, TIBC, IRON, RETICCTPCT in the last 72 hours. Sepsis Labs: No results for input(s): PROCALCITON, LATICACIDVEN in the last 168 hours.  Recent Results (from the past 240 hour(s))  SARS CORONAVIRUS 2 (TAT 6-24 HRS) Nasopharyngeal Nasopharyngeal Swab     Status: None   Collection Time: 05/16/21  1:20 PM   Specimen: Nasopharyngeal Swab  Result Value Ref Range Status   SARS Coronavirus 2 NEGATIVE NEGATIVE Final    Comment:  (NOTE) SARS-CoV-2 target nucleic acids are NOT DETECTED.  The SARS-CoV-2 RNA is generally detectable in upper and lower respiratory specimens during the acute phase of infection. Negative results do not preclude SARS-CoV-2 infection, do not rule out co-infections with other pathogens, and should not be used as the sole basis for treatment or other patient management decisions. Negative results must be combined with clinical observations, patient history, and epidemiological information. The expected result is Negative.  Fact Sheet for Patients: SugarRoll.be  Fact Sheet for Healthcare Providers: https://www.woods-mathews.com/  This test is not yet approved or cleared by the [place] FDA and  has been authorized for detection and/or diagnosis of SARS-CoV-2 by FDA under an Emergency Use Authorization (EUA). This EUA will remain  in effect (meaning this test can be used) for the duration of the COVID-19 declaration under Se ction 564(b)(1) of the Act, 21 U.S.C. section 360bbb-3(b)(1), unless the authorization is terminated or revoked sooner.  Performed at [facility], [place] [address], [place], [place] [address]   SARS CORONAVIRUS 2 (TAT 6-24 HRS) Nasopharyngeal Nasopharyngeal Swab     Status: None   Collection Time: 05/22/21  4:35 PM   Specimen: Nasopharyngeal Swab  Result Value Ref Range Status   SARS Coronavirus 2 NEGATIVE NEGATIVE Final    Comment: (NOTE) SARS-CoV-2 target nucleic acids are NOT DETECTED.  The SARS-CoV-2 RNA is generally detectable in upper and lower respiratory specimens during the acute phase of infection. Negative results do not preclude SARS-CoV-2 infection, do not rule out co-infections with other pathogens, and should not be used as the sole basis for treatment or other patient management decisions. Negative results must be combined with clinical observations, patient history, and epidemiological  information. The expected result is Negative.  Fact Sheet for Patients: SugarRoll.be  Fact Sheet for Healthcare Providers: https://www.woods-mathews.com/  This test is not yet approved or cleared by the [place] FDA and  has been authorized for detection and/or diagnosis of SARS-CoV-2 by FDA under an Emergency Use Authorization (EUA). This EUA will remain  in effect (meaning this test can be used) for the duration of the COVID-19 declaration under Se ction 564(b)(1) of the Act, 21 U.S.C. section 360bbb-3(b)(1), unless the authorization is terminated or revoked sooner.  Performed at [facility], [place] [address] [place], [place] [address]          Radiology Studies: No results found.      Scheduled Meds:  albuterol  2.5 mg Nebulization BID   amoxicillin-clavulanate  1 tablet Oral Q12H   buPROPion  300 mg Oral QHS   Chlorhexidine Gluconate Cloth  6 each Topical  Daily   feeding supplement  237 mL Oral BID BM   ferrous sulfate  325 mg Oral Q breakfast   gabapentin  200 mg Oral BID   insulin aspart  0-15 Units Subcutaneous TID WC   mouth rinse  15 mL Mouth Rinse BID   metoprolol succinate  12.5 mg Oral Daily   mometasone-formoterol  2 puff Inhalation BID   multivitamin with minerals  1 tablet Oral Daily   predniSONE  50 mg Oral Q breakfast   rivaroxaban  20 mg Oral Daily   umeclidinium bromide  1 puff Inhalation Daily   Continuous Infusions:   LOS: 11 days   Time spent= 35 mins    Ian Cavey Arsenio Loader, MD [facility]  If 7PM-7AM, please contact night-coverage  05/23/2021, 10:54 AM

## 2021-05-23 NOTE — Progress Notes (Signed)
Physical Therapy Treatment Patient Details Name: Meagan Holt MRN: [id number] DOB: 06/24/1941 Today's Date: 05/23/2021   History of Present Illness Meagan Holt is a 80 y.o. female presents with increasing difficulty breathing and worsening cough. Patient reported generalized weakness, unable to ambulate and increased falls. Pt transferred to ICU 9/14 for higher level of care with CXR showed Mild worsening of bilateral pleural effusions; back to floor on 9/16. PMH: COPD, atrial fibrillation, depression.    PT Comments    Pt tolerates seated BLE strengthening exercises without complaints, able to perform 2 STS reps but had to stop due to increasing LBP. Pt ambulates 24 ft with RW, generally unsteady requiring min A without LOB. Pt demonstrates 3/4 dyspnea, 93% on 3L O2 while ambulating and 95% with seated exercises. Pt appears agitated with therapist encouraging exercise and ambulation; educated pt on benefit of time OOB, mobilizing with therapy, improving strength and activity tolerance, therapeutic process to return to baseline with fair carryover. Will continue to progress acute PT as able.    Recommendations for follow up therapy are one component of a multi-disciplinary discharge planning process, led by the attending physician.  Recommendations may be updated based on patient status, additional functional criteria and insurance authorization.  Follow Up Recommendations  SNF     Equipment Recommendations  None recommended by PT    Recommendations for Other Services       Precautions / Restrictions Precautions Precautions: Fall Precaution Comments: legally blind, monitor O2 Restrictions Weight Bearing Restrictions: No     Mobility  Bed Mobility  General bed mobility comments: in recliner    Transfers Overall transfer level: Needs assistance Equipment used: Rolling walker (2 wheeled) Transfers: Sit to/from Stand Sit to Stand: Min guard  General transfer comment: BUE  assisting to power to stand on arm rest, VCs for glute/quad activation to decrease low back discomfort with transfer, able to complete 2 reps  Ambulation/Gait Ambulation/Gait assistance: Min assist Gait Distance (Feet): 24 Feet Assistive device: Rolling walker (2 wheeled) Gait Pattern/deviations: Step-to pattern;Decreased stride length;Trunk flexed Gait velocity: decreased   General Gait Details: slow, step to pattern with trunk flexed, completes turns with increased time and mild unsteadiness without LOB, distances limited per pt requesting to remain in room, dyspnea 3/4 on 3L with SpO2 93%   Stairs             Wheelchair Mobility    Modified Rankin (Stroke Patients Only)       Balance Overall balance assessment: Mild deficits observed, not formally tested           Cognition Arousal/Alertness: Awake/alert Behavior During Therapy: WFL for tasks assessed/performed;Agitated Overall Cognitive Status: Within Functional Limits for tasks assessed     Exercises General Exercises - Lower Extremity Long Arc Quad: Seated;AROM;Strengthening;Both;10 reps Hip Flexion/Marching: Seated;AROM;Strengthening;Both;10 reps Other Exercises Other Exercises: STS, 2 reps from recliner, declines more reps due to LBP    General Comments General comments (skin integrity, edema, etc.): Pt on 3L with SpO2 95% with seated exercise and 93% with ambulation      Pertinent Vitals/Pain Pain Assessment: Faces Faces Pain Scale: Hurts little more Pain Location: back and R knee Pain Descriptors / Indicators: Grimacing;Guarding Pain Intervention(s): Limited activity within patient's tolerance;Monitored during session    Home Living                      Prior Function            PT Goals (current  goals can now be found in the care plan section) Acute Rehab PT Goals Patient Stated Goal: return home after rehab PT Goal Formulation: With patient Time For Goal Achievement:  06/02/21 Potential to Achieve Goals: Good Progress towards PT goals: Progressing toward goals    Frequency    Min 3X/week      PT Plan Current plan remains appropriate    Co-evaluation              AM-PAC PT "6 Clicks" Mobility   Outcome Measure  Help needed turning from your back to your side while in a flat bed without using bedrails?: A Little Help needed moving from lying on your back to sitting on the side of a flat bed without using bedrails?: A Little Help needed moving to and from a bed to a chair (including a wheelchair)?: A Little Help needed standing up from a chair using your arms (e.g., wheelchair or bedside chair)?: A Little Help needed to walk in hospital room?: A Little Help needed climbing 3-5 steps with a railing? : A Lot 6 Click Score: 17    End of Session Equipment Utilized During Treatment: Gait belt;Oxygen Activity Tolerance: Patient tolerated treatment well;Patient limited by fatigue;Patient limited by pain Patient left: in chair;with call bell/phone within reach;with nursing/sitter in room Nurse Communication: Mobility status PT Visit Diagnosis: Unsteadiness on feet (R26.81);Other abnormalities of gait and mobility (R26.89);Muscle weakness (generalized) (M62.81)     Time: 2025-4270 PT Time Calculation (min) (ACUTE ONLY): 36 min  Charges:  $Gait Training: 8-22 mins $Therapeutic Exercise: 8-22 mins                      Tori Clemence Stillings PT, DPT 05/23/21, 2:00 PM

## 2021-05-23 NOTE — Progress Notes (Signed)
Speech Language Pathology Treatment: Dysphagia  Patient Details Name: Meagan Holt MRN: [id number] DOB: 10/29/1940 Today's Date: 05/23/2021 Time: 0379-4446 SLP Time Calculation (min) (ACUTE ONLY): 18 min  Assessment / Plan / Recommendation Clinical Impression  Pt seen for ongoing dysphagia intervention. She has been consuming regular textures and thin liquids and reportedly doing well (RN and Pt report). Pt tells SLP that she has been asked to refrain from using a straw. She consumed cup sips of thin water, self fed, without signs of reduced airway protection. She consumed most of her lunch meal. Pt was reminded to refrain from talking while eating/drinking, otherwise consuming foods and liquids without incident. Recommend continue regular textures and thin liquids with standard aspiration and reflux precautions and SLP will sign off. Above to RN.   HPI HPI: 80 year old female with history of COPD, chronic hypoxic respiratory failure on 2 L oxygen at home, chronic atrial fibrillation and depression, blindness brought to the emergency room when she was found by her caretaker sitting in her own urine and feces, extremely debilitated and unable to ambulate.  Patient herself reported that she had not been eating for few days and had multiple loose stools.  She lives at an apartment, independent living.  Uses 2 L oxygen at home.  EMS recorded oxygen of 70% on arrival but not using oxygen.  Admitted as COPD exacerbation, frailty and debility. Discharge was planned on 9/14, same evening became acutely dyspneic with extremely poor air movement, chest x-ray was relatively unchanged except for mild pleural effusion. Transferred to the ICU yesterday, started on high-dose IV steroids, antibiotics, nebs, BiPAP was planned however she improved before this was added. RUL Seen by pulmonary, unclear determine whether this could be malignancy, recommended conservative approach, surveillance in the setting of advanced  COPD, age and frailty, recommended pulmonary office follow-up in a few weeks to reassess. BSE requested.      SLP Plan  All goals met;Discharge SLP treatment due to (comment)      Recommendations for follow up therapy are one component of a multi-disciplinary discharge planning process, led by the attending physician.  Recommendations may be updated based on patient status, additional functional criteria and insurance authorization.    Recommendations  Diet recommendations: Regular;Thin liquid Liquids provided via: Cup;No straw Medication Administration: Whole meds with liquid Supervision: Patient able to self feed Compensations: Small sips/bites;Slow rate;Lingual sweep for clearance of pocketing Postural Changes and/or Swallow Maneuvers: Seated upright 90 degrees;Upright 30-60 min after meal                Oral Care Recommendations: Oral care BID Follow up Recommendations: 24 hour supervision/assistance SLP Visit Diagnosis: Dysphagia, unspecified (R13.10) Plan: All goals met;Discharge SLP treatment due to (comment)       Thank you,  Genene Churn, [place]                [place] 05/23/2021, 3:22 PM

## 2021-05-24 DIAGNOSIS — J441 Chronic obstructive pulmonary disease with (acute) exacerbation: Secondary | ICD-10-CM | POA: Diagnosis not present

## 2021-05-24 LAB — CBC
HCT: 45.5 % (ref 36.0–46.0)
Hemoglobin: 14 g/dL (ref 12.0–15.0)
MCH: 30.3 pg (ref 26.0–34.0)
MCHC: 30.8 g/dL (ref 30.0–36.0)
MCV: 98.5 fL (ref 80.0–100.0)
Platelets: 309 10*3/uL (ref 150–400)
RBC: 4.62 MIL/uL (ref 3.87–5.11)
RDW: 14 % (ref 11.5–15.5)
WBC: 13 10*3/uL — ABNORMAL HIGH (ref 4.0–10.5)
nRBC: 0 % (ref 0.0–0.2)

## 2021-05-24 LAB — BASIC METABOLIC PANEL
Anion gap: 8 (ref 5–15)
BUN: 29 mg/dL — ABNORMAL HIGH (ref 8–23)
CO2: 33 mmol/L — ABNORMAL HIGH (ref 22–32)
Calcium: 8.7 mg/dL — ABNORMAL LOW (ref 8.9–10.3)
Chloride: 94 mmol/L — ABNORMAL LOW (ref 98–111)
Creatinine, Ser: 0.54 mg/dL (ref 0.44–1.00)
GFR, Estimated: >60 mL/min (ref >60)
Glucose, Bld: 79 mg/dL (ref 70–99)
Potassium: 4 mmol/L (ref 3.5–5.1)
Sodium: 135 mmol/L (ref 135–145)

## 2021-05-24 LAB — MAGNESIUM: Magnesium: 2.2 mg/dL (ref 1.7–2.4)

## 2021-05-24 LAB — GLUCOSE, CAPILLARY
Glucose-Capillary: 88 mg/dL (ref 70–99)
Glucose-Capillary: 158 mg/dL — ABNORMAL HIGH (ref 70–99)

## 2021-05-24 LAB — BRAIN NATRIURETIC PEPTIDE: B Natriuretic Peptide: 172 pg/mL — ABNORMAL HIGH (ref 0.0–100.0)

## 2021-05-24 MED ORDER — PREDNISONE 50 MG PO TABS: 50.0000 mg | ORAL_TABLET | Freq: Every day | ORAL | 0 refills | Status: AC

## 2021-05-24 NOTE — TOC Transition Note (Signed)
Transition of Care [facility]) - CM/SW Discharge Note   Patient Details  Name: Meagan Holt MRN: [id number] Date of Birth: 12-Dec-1940  [facility] CM/SW Contact:  Natasha Bence, LCSW Phone Number: 05/24/2021, 11:10 AM   Clinical Narrative:    CSW notified of patient's readiness of discharge. CSW faxed discharge summary and Covid test. [facility] agreeable to take patient. CSW completed med necessity. Nurse to call report and EMS. TOC signing off.    Final next level of care: Skilled Nursing Facility Barriers to Discharge: Barriers Resolved   Patient Goals and CMS Choice Patient states their goals for this hospitalization and ongoing recovery are:: Rehab with SNF CMS Medicare.gov Compare Post Acute Care list provided to:: Patient Choice offered to / list presented to : Patient  Discharge Placement              Patient [facility] at: [facility] Patient to be transferred to facility by: [facility] EMS Name of family member notified: Creed Copper (Niece)   [phone] Patient and family notified of of transfer: 05/24/21  Discharge Plan and Services In-house Referral: Clinical Social Work Discharge Planning Services: CM Consult Post Acute Care Choice: [place]                               Social Determinants of Health (SDOH) Interventions     Readmission Risk Interventions Readmission Risk Prevention Plan 05/12/2021 04/16/2020  Medication Screening Complete Complete  Transportation Screening Complete Complete  Some recent data might be hidden

## 2021-05-24 NOTE — Progress Notes (Signed)
Occupational Therapy Treatment Patient Details Name: Meagan Holt MRN: [id number] DOB: July 29, 1941 Today's Date: 05/24/2021   History of present illness Meagan Holt is a 80 y.o. female presents with increasing difficulty breathing and worsening cough. Patient reported generalized weakness, unable to ambulate and increased falls. Pt transferred to ICU 9/14 for higher level of care with CXR showed Mild worsening of bilateral pleural effusions; back to floor on 9/16. PMH: COPD, atrial fibrillation, depression.   OT comments  Pt agreeable to OT treatment this date. Pt requires Min G assist for mobility with min verbal cuing for safe use of RW during mobility. Pt required Min G to SPV assist for supine to sit bed mobility. Pt required total assist for donning socks at bed level. Pt was able to stand at sink and complete combing hair and washing hands with Min A due to difficulty locating items due to poor vision. Pt noted to only comb R side of  hair using R UE before reporting fatigue and need to return to bed. Pt will benefit from continued OT in the hospital and recommended venue below to increase strength, balance, and endurance for safe ADL's.      Recommendations for follow up therapy are one component of a multi-disciplinary discharge planning process, led by the attending physician.  Recommendations may be updated based on patient status, additional functional criteria and insurance authorization.    Follow Up Recommendations  SNF    Equipment Recommendations  None recommended by OT          Precautions / Restrictions Precautions Precautions: Fall Precaution Comments: legally blind, monitor O2       Mobility Bed Mobility Overal bed mobility: Needs Assistance Bed Mobility: Supine to Sit     Supine to sit: Min guard;Supervision     General bed mobility comments: slow labored movement    Transfers Overall transfer level: Needs assistance Equipment used: Rolling walker  (2 wheeled) Transfers: Sit to/from [facility] Sit to Stand: Min guard Stand pivot transfers: Min guard       General transfer comment: Min verbal cuing for use of RW. Slow labored movement.    Balance Overall balance assessment: Mild deficits observed, not formally tested                                         ADL either performed or assessed with clinical judgement   ADL Overall ADL's : Needs assistance/impaired     Grooming: Wash/dry hands;Brushing hair;Minimal assistance;Standing Grooming Details (indicate cue type and reason): Standing at sink using RW with cuing to locate objects due to poor vision. Using R UE to comb only R side of hair before reporting fatigue and need to sit.     Lower Body Bathing: Maximal assistance;Total assistance;Bed level Lower Body Bathing Details (indicate cue type and reason): Assisted donning socks at bed level.         Toilet Transfer: Stand-pivot;RW;Min Art therapist Details (indicate cue type and reason): Min cuing for safety with RW Toileting- Clothing Manipulation and Hygiene: Min guard;Sit to/from stand Toileting - Clothing Manipulation Details (indicate cue type and reason): Pt able to complete peri-care after urination while in standing using RW.                               Cognition Arousal/Alertness: Awake/alert  Behavior During Therapy: WFL for tasks assessed/performed;Agitated Overall Cognitive Status: Within Functional Limits for tasks assessed                                                      General Comments On 3L O2 during session.    Pertinent Vitals/ Pain       Pain Assessment: No/denies pain  Home Living                                                        Frequency  Min 2X/week        Progress Toward Goals  OT Goals(current goals can now be found in the care plan section)  Progress towards OT  goals: Progressing toward goals  Acute Rehab OT Goals Patient Stated Goal: return home after rehab OT Goal Formulation: With patient Time For Goal Achievement: 06/03/21 Potential to Achieve Goals: Good ADL Goals Pt Will Perform Grooming: standing;with min assist;with adaptive equipment Pt Will Perform Lower Body Dressing: with supervision;sitting/lateral leans;with adaptive equipment;with modified independence Pt Will Transfer to Toilet: with modified independence;stand pivot transfer;bedside commode Pt/caregiver will Perform Home Exercise Program: Increased ROM;Both right and left upper extremity;Increased strength;With Supervision  Plan Discharge plan remains appropriate                                    End of Session Equipment Utilized During Treatment: Rolling walker;Oxygen  OT Visit Diagnosis: Unsteadiness on feet (R26.81);Muscle weakness (generalized) (M62.81)   Activity Tolerance Patient tolerated treatment well   Patient Left with call bell/phone within reach;in chair   Nurse Communication          Time: 3875-6433 OT Time Calculation (min): 25 min  Charges: OT General Charges $OT Visit: 1 Visit OT Treatments $Therapeutic Activity: 23-37 mins  Aviance [place] OT, MOT  Larey Seat 05/24/2021, 9:46 AM

## 2021-05-24 NOTE — Discharge Summary (Signed)
Physician Discharge Summary  Meagan Holt WNI:[id number] DOB: 01-31-1941 DOA: 05/11/2021  PCP: [name], MD  Admit date: 05/11/2021 Discharge date: 05/24/2021  Admitted From: Home Disposition: SNF  Recommendations for Outpatient Follow-up:  Follow up with PCP in 1-2 weeks Please obtain BMP/CBC in one week your next doctors visit.  Complete Augmentin and prednisone course as prescribed Follow-up outpatient pulmonary in next 2-4 weeks   Discharge Condition: Stable CODE STATUS: DNR Diet recommendation: Heart healthy  Brief/Interim Summary: 80 year old female with history of COPD, chronic hypoxic respiratory failure on 2 L oxygen at home, chronic atrial fibrillation and depression, blindness brought to the emergency room when she was found by her caretaker sitting in her own urine and feces, extremely debilitated and unable to ambulate.  Patient herself reported that she had not been eating for few days and had multiple loose stools.  She lives at an apartment, independent living.  Uses 2 L oxygen at home.  EMS recorded oxygen of 70% on arrival but not using oxygen.  Admitted as COPD exacerbation, frailty and debility.  Upon admission she was started on steroids, antibiotics and bronchodilators.  PT recommended SNF.  Over several days her symptoms slowly improved.  She did receive as needed Lasix during hospitalization.  Over the course of several days patient's symptoms improved and she was stable for discharge on 05/24/2021 with recommendations as stated above.       Assessment & Plan:   Principal Problem:   COPD with acute exacerbation ([place]) Active Problems:   Depression   History of stroke   Acute on chronic respiratory failure with hypoxia (HCC)   Recurrent falls   Atrial fibrillation, chronic (HCC)   IDA (iron deficiency anemia)   Macrocytic anemia   Lung tumor (benign), right   Malnutrition of moderate degree   Acute hypoxic respiratory failure Severe COPD exacerbation/  Acute on chronic diastolic CHF -She is on 4 L nasal cannula, at home she is also on 3-4 L nasal cannula.  -Continue steroids, bronchodilators.  Currently she also on Augmentin, last day would be 9/23.  Oral prednisone for 4 more days. -Continue Xarelto.  As needed Lasix if necessary.  But overall her volume status appears to be better   Advanced COPD/ Ongoing tobacco abuse -counseled   Hypokalemia and hypomagnesemia:  -Improved. Replete prn   Chronic atrial fibrillation:  -Rate controlled on beta-blockers. Cont Xarelto   RUL nodule -Seen by pulmonary, unclear determine whether this could be malignancy, recommended conservative approach, surveillance in the setting of advanced COPD, age and frailty, recommended pulmonary office follow-up in a few weeks to reassess  Chronic iron deficiency anemia:  -Hb is stable. No signs of bleeding.    Delirium: Reported developed delirium while hospitalized. Currently resolved.  -Likely secondary to steroids and withdrawal of meds including Wellbutrin, opiates and gabapentin, this was received, mental status back to baseline    Deconditioning Physical debility and frailty: Work with PT OT.   Plans for SNF today   Goals: DNR now, seen by palliative care this admission, plan for palliative care follow-up after discharge      Body mass index is 27.9 kg/m.         Discharge Diagnoses:  Principal Problem:   COPD with acute exacerbation ([place]) Active Problems:   Depression   History of stroke   Acute on chronic respiratory failure with hypoxia (HCC)   Recurrent falls   Atrial fibrillation, chronic (HCC)   IDA (iron deficiency anemia)  Macrocytic anemia   Lung tumor (benign), right   Malnutrition of moderate degree      Subjective: Feels better no complaints.  Sitting up on the recliner  Discharge Exam: Vitals:   05/24/21 0707 05/24/21 1005  BP:  111/63  Pulse:  62  Resp:    Temp:    SpO2: 97% 97%   Vitals:   05/23/21  2111 05/24/21 0417 05/24/21 0707 05/24/21 1005  BP: 136/82 123/84  111/63  Pulse: 85 74  62  Resp: 18 18    Temp: 97.6 F (36.4 C) 98.4 F (36.9 C)    TempSrc:      SpO2: 96% 96% 97% 97%  Weight:      Height:        General: Pt is alert, awake, not in acute distress, on 3 L nasal cannula Cardiovascular: RRR, S1/S2 +, no rubs, no gallops Respiratory: CTA bilaterally, no wheezing, no rhonchi Abdominal: Soft, NT, ND, bowel sounds + Extremities: no edema, no cyanosis  Discharge Instructions  Discharge Instructions     Ambulatory referral to Pulmonology   Complete by: As directed    Reason for referral: Lung Mass/Lung Nodule   Call MD for:  difficulty breathing, headache or visual disturbances   Complete by: As directed    Diet general   Complete by: As directed    Increase activity slowly   Complete by: As directed       Allergies as of 05/24/2021       Reactions   Latex Itching, Swelling   Doxycycline Rash        Medication List     STOP taking these medications    HYDROcodone Bitartrate ER 30 MG T24a   HYDROcodone-acetaminophen 10-325 MG tablet Commonly known as: NORCO       TAKE these medications    Advair Diskus 250-50 MCG/ACT Aepb Generic drug: fluticasone-salmeterol Inhale 1 puff into the lungs 2 (two) times daily.   albuterol 108 (90 Base) MCG/ACT inhaler Commonly known as: VENTOLIN HFA Inhale 2 puffs into the lungs every 6 (six) hours as needed for wheezing or shortness of breath.   amoxicillin-clavulanate 875-125 MG tablet Commonly known as: AUGMENTIN Take 1 tablet by mouth every 12 (twelve) hours for 10 days.   buPROPion 300 MG 24 hr tablet Commonly known as: WELLBUTRIN XL Take 300 mg by mouth at bedtime.   ferrous sulfate 325 (65 FE) MG tablet Take 1 tablet (325 mg total) by mouth daily with breakfast.   Fish Oil 1000 MG Caps Take 3 capsules by mouth daily.   furosemide 20 MG tablet Commonly known as: LASIX Take 20 mg by mouth  daily as needed for fluid or edema.   gabapentin 600 MG tablet Commonly known as: NEURONTIN Take 600 mg by mouth 3 (three) times daily.   ICAPS PO Take 1 tablet by mouth daily.   ipratropium-albuterol 0.5-2.5 (3) MG/3ML Soln Commonly known as: DUONEB Take 3 mLs by nebulization 3 (three) times daily.   metoprolol succinate 25 MG 24 hr tablet Commonly known as: TOPROL-XL Take 12.5 mg by mouth daily.   multivitamin with minerals Tabs tablet Take 1 tablet by mouth daily.   Polyethyl Glycol-Propyl Glycol 0.4-0.3 % Soln Apply 1-2 drops to eye daily as needed (for dry eye relief).   predniSONE 50 MG tablet Commonly known as: DELTASONE Take 1 tablet (50 mg total) by mouth daily with breakfast for 4 days. Start taking on: May 25, 2021   Xarelto 20 MG Tabs  tablet Generic drug: rivaroxaban Take 1 tablet by mouth every evening.   zolpidem 5 MG tablet Commonly known as: AMBIEN Take 1 tablet (5 mg total) by mouth at bedtime as needed for up to 5 days for sleep.       ASK your doctor about these medications    HYDROcodone Bitartrate ER 30 MG T24a Take 30 mg by mouth daily for 5 days. Ask about: Should I take this medication?   HYDROcodone-acetaminophen 10-325 MG tablet Commonly known as: NORCO Take 1 tablet by mouth 3 (three) times daily as needed for up to 5 days for moderate pain. Ask about: Should I take this medication?        Follow-up Information     [facility] Follow up.   Contact information: [place]        [name], MD Follow up in 1 week(s).   Specialty: Family Medicine Contact information: [address] [place] [address] [phone]         Herminio Commons, MD .   Specialty: Cardiology Contact information: [place] [address] [phone]                Allergies  Allergen Reactions   Latex Itching and Swelling   Doxycycline  Rash    You were cared for by a hospitalist during your hospital stay. If you have any questions about your discharge medications or the care you received while you were in the hospital after you are discharged, you can call the unit and asked to speak with the hospitalist on call if the hospitalist that took care of you is not available. Once you are discharged, your primary care physician will handle any further medical issues. Please note that no refills for any discharge medications will be authorized once you are discharged, as it is imperative that you return to your primary care physician (or establish a relationship with a primary care physician if you do not have one) for your aftercare needs so that they can reassess your need for medications and monitor your lab values.   Procedures/Studies: CT CHEST WO CONTRAST  Result Date: 05/16/2021 CLINICAL DATA:  Chest pain or SOB, pleurisy or effusion suspected Presenting with weakness and trouble breathing history of COPD, chronic hypoxic respiratory failure on 2 L oxygen at home EXAM: CT CHEST WITHOUT CONTRAST TECHNIQUE: Multidetector CT imaging of the chest was performed following the standard protocol without IV contrast. COMPARISON:  Chest x-ray 05/14/2021, CT abdomen pelvis 03/05/2012 FINDINGS: Cardiovascular: Question enlarged right atria with limited evaluation on this noncontrast study. No significant pericardial effusion. The thoracic aorta is normal in caliber. Severe atherosclerotic plaque of the thoracic aorta. Four-vessel coronary artery calcifications. Mediastinum/Nodes: Question right hilar adenopathy, noting limited sensitivity for the detection of hilar adenopathy on this noncontrast study. Enlarged mediastinal lymph nodes with as an example a 1.4 cm right paratracheal lymph node (2:46). Or axillary lymph nodes. Thyroid gland, trachea, and esophagus demonstrate no significant findings. Lungs/Pleura: Mild Paraseptal and centrilobular  emphysematous changes. Bilateral diffuse centrilobular and tree-in-bud nodularities. Scattered ground-glass airspace opacities within the lung apices with largest region measuring up to 1.7 cm (4:28). There is a 1.1 cm pulmonary nodule within the right upper lobe with associated tethering of the pleura (4:60, 5:39). There is an associated 0.4 cm subjacent pulmonary nodule (4:75). Bilateral upper lobe dependent atelectasis. Diffuse bronchial wall thickening. No pleural effusion. No pneumothorax. Upper Abdomen: Status  post cholecystectomy.  No acute abnormality. Musculoskeletal: No chest wall abnormality. No suspicious lytic or blastic osseous lesions. No acute displaced fracture. Multilevel degenerative changes of the spine. IMPRESSION: 1. Diffuse bronchial wall thickening with associated bilateral centrilobular and tree-in-bud nodularity suggestive of bronchiolitis versus atypical infection. 2. A 1.1 cm pulmonary nodule within the right upper lobe with associated tethering of the pleura is concerning for malignancy. Associated subjacent 0.4 cm pulmonary nodule. Consider one of the following in 3 months for both low-risk and high-risk individuals: (a) repeat chest CT, (b) follow-up PET-CT, or (c) tissue sampling. This recommendation follows the consensus statement: Guidelines for Management of Incidental Pulmonary Nodules Detected on CT Images: From the Fleischner Society 2017; Radiology 2017; 284:228-243. 3. Scattered biapical ground-glass airspace opacities which may represent infection/inflammation with underlying adenocarcinoma not excluded. Initial follow-up with CT at 6-12 months is recommended to confirm persistence. If persistent, repeat CT is recommended every 2 years until 5 years of stability has been established. This recommendation follows the consensus statement: Guidelines for Management of Incidental Pulmonary Nodules Detected on CT Images: From the Fleischner Society 2017; Radiology 2017;  284:228-243. 4. Mediastinal lymphadenopathy with question of right hilar lymphadenopathy. Limited evaluation on this noncontrast study. 5. Aortic Atherosclerosis (ICD10-I70.0) and Emphysema (ICD10-J43.9). 6. Four-vessel coronary artery calcifications. Electronically Signed   By: Iven Finn M.D.   On: 05/16/2021 18:46   DG CHEST PORT 1 VIEW  Result Date: 05/18/2021 CLINICAL DATA:  Shortness of breath, history of COPD EXAM: PORTABLE CHEST 1 VIEW COMPARISON:  Chest radiograph 05/14/2021 FINDINGS: The heart is mildly enlarged, unchanged. The mediastinal contours are stable. Lung volumes are low. Small bilateral pleural effusions are again seen, minimally increased in size with adjacent airspace disease likely reflecting atelectasis. Increased interstitial markings throughout both lungs are not significantly changed. Otherwise, there is no new focal airspace disease. There is no pneumothorax. The known right upper lobe nodule is not seen on the current study. There is no acute osseous abnormality. There is multilevel degenerative change of the spine. IMPRESSION: 1. Slight interval increase in size of small bilateral pleural effusion since 05/14/2021, with adjacent airspace disease likely reflecting atelectasis. 2. Increased interstitial markings throughout both lungs are not significantly changed. 3. The known suspicious right upper lobe nodule is not seen on the current study. Electronically Signed   By: Valetta Mole M.D.   On: 05/18/2021 17:14   DG Chest Port 1 View  Result Date: 05/14/2021 CLINICAL DATA:  Pulmonary edema and weakness.  COPD. EXAM: PORTABLE CHEST 1 VIEW COMPARISON:  05/11/2021 FINDINGS: Midline trachea. Mild cardiomegaly. No pleural effusion or pneumothorax. Moderate pulmonary interstitial thickening. No lobar consolidation. IMPRESSION: Cardiomegaly and moderate pulmonary interstitial thickening, felt to be similar. This could simply be secondary to smoking/chronic bronchitis. However,  pulmonary venous congestion could look similar. No overt congestive failure. Electronically Signed   By: Abigail Miyamoto M.D.   On: 05/14/2021 08:19   DG Chest Portable 1 View  Result Date: 05/11/2021 CLINICAL DATA:  Shortness of breath, worsening weakness, history of COPD EXAM: PORTABLE CHEST 1 VIEW COMPARISON:  12/11/2020 FINDINGS: Cardiomegaly. Diffuse bilateral interstitial pulmonary opacity. The visualized skeletal structures are unremarkable. IMPRESSION: Cardiomegaly with diffuse bilateral interstitial pulmonary opacity, likely mild edema. No focal airspace opacity. Electronically Signed   By: Eddie Candle M.D.   On: 05/11/2021 15:39   ECHOCARDIOGRAM COMPLETE  Result Date: 05/19/2021    ECHOCARDIOGRAM REPORT   Patient Name:   BERIT RACZKOWSKI [facility] Date of Exam: 05/19/2021 Medical Rec #:  [id number]       Height:       66.0 in Accession #:    [id number]      Weight:       172.8 lb Date of Birth:  06/01/1941       BSA:          1.880 m Patient Age:    36 years        BP:           106/68 mmHg Patient Gender: F               HR:           89 bpm. Exam Location:  [facility] Procedure: 2D Echo, Cardiac Doppler and Color Doppler Indications:    Acute respiratory distress  History:        Patient has prior history of Echocardiogram examinations, most                 recent 05/12/2021. COPD and Stroke, Arrythmias:Atrial                 Fibrillation, Signs/Symptoms:Syncope; Risk Factors:Tobacco abuse                 and Diabetes.  Sonographer:    Wenda Low Referring Phys: [place]  1. Left ventricular ejection fraction, by estimation, is 60 to 65%. The left ventricle has normal function. The left ventricle has no regional wall motion abnormalities. There is mild left ventricular hypertrophy. Left ventricular diastolic parameters are indeterminate.  2. RV-RA gradient 18 mmHg suggests normal RVSP. Right ventricular systolic function is low normal. The right ventricular size is moderately enlarged.   3. Left atrial size was severely dilated.  4. Right atrial size was moderately dilated.  5. There is a trivial pericardial effusion posterior to the left ventricle.  6. The mitral valve is grossly normal. Mild mitral valve regurgitation.  7. The aortic valve is tricuspid. There is moderate calcification of the aortic valve. Aortic valve regurgitation is not visualized. Mild to moderate aortic valve sclerosis/calcification is present, without any evidence of aortic stenosis. Aortic valve mean gradient measures 6.0 mmHg.  8. Unable to estimate CVP. Comparison(s): Prior images reviewed side by side. RV function similar to prior study. Unable to estimate CVP. FINDINGS  Left Ventricle: Left ventricular ejection fraction, by estimation, is 60 to 65%. The left ventricle has normal function. The left ventricle has no regional wall motion abnormalities. The left ventricular internal cavity size was normal in size. There is  mild left ventricular hypertrophy. Left ventricular diastolic function could not be evaluated due to atrial fibrillation. Left ventricular diastolic parameters are indeterminate. Right Ventricle: RV-RA gradient 18 mmHg suggests normal RVSP. The right ventricular size is moderately enlarged. No increase in right ventricular wall thickness. Right ventricular systolic function is low normal. Left Atrium: Left atrial size was severely dilated. Right Atrium: Right atrial size was moderately dilated. Pericardium: Trivial pericardial effusion is present. The pericardial effusion is posterior to the left ventricle. Mitral Valve: The mitral valve is grossly normal. Mild to moderate mitral annular calcification. Mild mitral valve regurgitation. MV peak gradient, 4.4 mmHg. The mean mitral valve gradient is 2.0 mmHg. Tricuspid Valve: The tricuspid valve is grossly normal. Tricuspid valve regurgitation is mild. Aortic Valve: The aortic valve is tricuspid. There is moderate calcification of the aortic valve. Aortic  valve regurgitation is not visualized. Mild to moderate aortic valve sclerosis/calcification is present, without any evidence of aortic  stenosis. Aortic valve mean gradient measures 6.0 mmHg. Aortic valve peak gradient measures 10.0 mmHg. Aortic valve area, by VTI measures 1.79 cm. Pulmonic Valve: The pulmonic valve was grossly normal. Pulmonic valve regurgitation is trivial. Aorta: The aortic root is normal in size and structure. Venous: Unable to estimate CVP. The inferior vena cava was not well visualized. IAS/Shunts: No atrial level shunt detected by color flow Doppler.  LEFT VENTRICLE PLAX 2D LVIDd:         4.80 cm  Diastology LVIDs:         3.50 cm  LV e' medial:    8.81 cm/s LV PW:         1.30 cm  LV E/e' medial:  11.3 LV IVS:        1.30 cm  LV e' lateral:   11.00 cm/s LVOT diam:     2.00 cm  LV E/e' lateral: 9.0 LV SV:         55 LV SV Index:   29 LVOT Area:     3.14 cm  RIGHT VENTRICLE RV Basal diam:  3.60 cm RV Mid diam:    3.90 cm RV S prime:     11.70 cm/s LEFT ATRIUM              Index       RIGHT ATRIUM           Index LA diam:        5.20 cm  2.77 cm/m  RA Area:     29.70 cm LA Vol (A2C):   168.0 ml 89.38 ml/m RA Volume:   96.30 ml  51.24 ml/m LA Vol (A4C):   95.4 ml  50.76 ml/m LA Biplane Vol: 130.0 ml 69.17 ml/m  AORTIC VALVE AV Area (Vmax):    1.75 cm AV Area (Vmean):   1.65 cm AV Area (VTI):     1.79 cm AV Vmax:           158.00 cm/s AV Vmean:          117.000 cm/s AV VTI:            0.309 m AV Peak Grad:      10.0 mmHg AV Mean Grad:      6.0 mmHg LVOT Vmax:         88.10 cm/s LVOT Vmean:        61.400 cm/s LVOT VTI:          0.176 m LVOT/AV VTI ratio: 0.57  AORTA Ao Root diam: 3.30 cm MITRAL VALVE               TRICUSPID VALVE MV Area (PHT): 5.50 cm    TR Peak grad:   18.5 mmHg MV Area VTI:   2.62 cm    TR Vmax:        215.00 cm/s MV Peak grad:  4.4 mmHg MV Mean grad:  2.0 mmHg    SHUNTS MV Vmax:       1.05 m/s    Systemic VTI:  0.18 m MV Vmean:      61.1 cm/s   Systemic Diam: 2.00  cm MV Decel Time: 138 msec MV E velocity: 99.40 cm/s Rozann Lesches MD Electronically signed by Rozann Lesches MD Signature Date/Time: 05/19/2021/1:31:11 PM    Final    ECHOCARDIOGRAM COMPLETE  Result Date: 05/12/2021    ECHOCARDIOGRAM REPORT   Patient Name:   JAZMYN OFFNER [facility] Date of Exam: 05/12/2021 Medical Rec #:  [id number]  Height:       66.0 in Accession #:    [id number]      Weight:       198.0 lb Date of Birth:  [birth year]       BSA:          1.991 m Patient Age:    67 years        BP:           116/63 mmHg Patient Gender: F               HR:           101 bpm. Exam Location:  [facility] Procedure: 2D Echo, Cardiac Doppler and Color Doppler Indications:    Acute respiratory distress  History:        Patient has prior history of Echocardiogram examinations, most                 recent 04/15/2020. COPD and Stroke, Arrythmias:Atrial                 Fibrillation, Signs/Symptoms:Syncope; Risk Factors:Current                 Smoker.  Sonographer:    Wenda Low Referring Phys: [place]  1. Left ventricular ejection fraction, by estimation, is 55 to 60%. The left ventricle has normal function. The left ventricle has no regional wall motion abnormalities. There is mild left ventricular hypertrophy. Left ventricular diastolic parameters are indeterminate.  2. Right ventricular systolic function is low normal. The right ventricular size is moderately enlarged. There is moderately elevated pulmonary artery systolic pressure.  3. Left atrial size was severely dilated.  4. Right atrial size was severely dilated.  5. The mitral valve is abnormal. Mild mitral valve regurgitation. No evidence of mitral stenosis.  6. The tricuspid valve is abnormal.  7. The aortic valve is abnormal. There is mild calcification of the aortic valve. There is mild thickening of the aortic valve. Aortic valve regurgitation is not visualized. No aortic stenosis is present.  8. Aortic dilatation noted. There  is mild dilatation of the ascending aorta, measuring 39 mm.  9. The inferior vena cava is dilated in size with <50% respiratory variability, suggesting right atrial pressure of 15 mmHg. FINDINGS  Left Ventricle: Left ventricular ejection fraction, by estimation, is 55 to 60%. The left ventricle has normal function. The left ventricle has no regional wall motion abnormalities. The left ventricular internal cavity size was normal in size. There is  mild left ventricular hypertrophy. Left ventricular diastolic parameters are indeterminate. Right Ventricle: The right ventricular size is moderately enlarged. Right vetricular wall thickness was not well visualized. Right ventricular systolic function is low normal. There is moderately elevated pulmonary artery systolic pressure. The tricuspid  regurgitant velocity is 3.34 m/s, and with an assumed right atrial pressure of 15 mmHg, the estimated right ventricular systolic pressure is 83.4 mmHg. Left Atrium: Left atrial size was severely dilated. Right Atrium: Right atrial size was severely dilated. Pericardium: There is no evidence of pericardial effusion. Mitral Valve: The mitral valve is abnormal. There is mild thickening of the mitral valve leaflet(s). There is mild calcification of the mitral valve leaflet(s). Mild mitral annular calcification. Mild mitral valve regurgitation. No evidence of mitral valve stenosis. MV peak gradient, 8.4 mmHg. The mean mitral valve gradient is 1.0 mmHg. Tricuspid Valve: The tricuspid valve is abnormal. Tricuspid valve regurgitation is mild . No evidence of tricuspid stenosis. Aortic Valve:  The aortic valve is abnormal. There is mild calcification of the aortic valve. There is mild thickening of the aortic valve. There is mild aortic valve annular calcification. Aortic valve regurgitation is not visualized. No aortic stenosis is present. Aortic valve mean gradient measures 6.5 mmHg. Aortic valve peak gradient measures 12.7 mmHg. Aortic  valve area, by VTI measures 1.53 cm. Pulmonic Valve: The pulmonic valve was not well visualized. Pulmonic valve regurgitation is trivial. No evidence of pulmonic stenosis. Aorta: The aortic root is normal in size and structure and aortic dilatation noted. There is mild dilatation of the ascending aorta, measuring 39 mm. Venous: The inferior vena cava is dilated in size with less than 50% respiratory variability, suggesting right atrial pressure of 15 mmHg. IAS/Shunts: No atrial level shunt detected by color flow Doppler.  LEFT VENTRICLE PLAX 2D LVIDd:         4.20 cm  Diastology LVIDs:         3.03 cm  LV e' medial:    10.30 cm/s LV PW:         1.20 cm  LV E/e' medial:  12.0 LV IVS:        1.20 cm  LV e' lateral:   14.20 cm/s LVOT diam:     2.00 cm  LV E/e' lateral: 8.7 LV SV:         53 LV SV Index:   27 LVOT Area:     3.14 cm  RIGHT VENTRICLE RV Basal diam:  4.42 cm RV Mid diam:    4.13 cm RV S prime:     10.60 cm/s LEFT ATRIUM              Index       RIGHT ATRIUM           Index LA diam:        5.20 cm  2.61 cm/m  RA Area:     32.20 cm LA Vol (A2C):   153.0 ml 76.84 ml/m RA Volume:   116.00 ml 58.26 ml/m LA Vol (A4C):   114.0 ml 57.25 ml/m LA Biplane Vol: 133.0 ml 66.80 ml/m  AORTIC VALVE AV Area (Vmax):    1.62 cm AV Area (Vmean):   1.47 cm AV Area (VTI):     1.53 cm AV Vmax:           178.00 cm/s AV Vmean:          115.500 cm/s AV VTI:            0.349 m AV Peak Grad:      12.7 mmHg AV Mean Grad:      6.5 mmHg LVOT Vmax:         91.80 cm/s LVOT Vmean:        54.200 cm/s LVOT VTI:          0.170 m LVOT/AV VTI ratio: 0.49  AORTA Ao Root diam: 3.20 cm Ao Asc diam:  3.90 cm MITRAL VALVE                TRICUSPID VALVE MV Area (PHT): 5.46 cm     TR Peak grad:   44.6 mmHg MV Area VTI:   1.43 cm     TR Vmax:        334.00 cm/s MV Peak grad:  8.4 mmHg MV Mean grad:  1.0 mmHg     SHUNTS MV Vmax:       1.45 m/s     Systemic VTI:  0.17 m MV Vmean:      48.7 cm/s    Systemic Diam: 2.00 cm MV Decel Time: 139  msec MV E velocity: 124.00 cm/s Carlyle Dolly MD Electronically signed by Carlyle Dolly MD Signature Date/Time: 05/12/2021/12:22:07 PM    Final      The results of significant diagnostics from this hospitalization (including imaging, microbiology, ancillary and laboratory) are listed below for reference.     Microbiology: Recent Results (from the past 240 hour(s))  SARS CORONAVIRUS 2 (TAT 6-24 HRS) Nasopharyngeal Nasopharyngeal Swab     Status: None   Collection Time: 05/16/21  1:20 PM   Specimen: Nasopharyngeal Swab  Result Value Ref Range Status   SARS Coronavirus 2 NEGATIVE NEGATIVE Final    Comment: (NOTE) SARS-CoV-2 target nucleic acids are NOT DETECTED.  The SARS-CoV-2 RNA is generally detectable in upper and lower respiratory specimens during the acute phase of infection. Negative results do not preclude SARS-CoV-2 infection, do not rule out co-infections with other pathogens, and should not be used as the sole basis for treatment or other patient management decisions. Negative results must be combined with clinical observations, patient history, and epidemiological information. The expected result is Negative.  Fact Sheet for Patients: SugarRoll.be  Fact Sheet for Healthcare Providers: https://www.woods-mathews.com/  This test is not yet approved or cleared by the [place] FDA and  has been authorized for detection and/or diagnosis of SARS-CoV-2 by FDA under an Emergency Use Authorization (EUA). This EUA will remain  in effect (meaning this test can be used) for the duration of the COVID-19 declaration under Se ction 564(b)(1) of the Act, 21 U.S.C. section 360bbb-3(b)(1), unless the authorization is terminated or revoked sooner.  Performed at [facility], [place] [address], [place], [place] [address]   SARS CORONAVIRUS 2 (TAT 6-24 HRS) Nasopharyngeal Nasopharyngeal Swab     Status: None   Collection Time: 05/22/21   4:35 PM   Specimen: Nasopharyngeal Swab  Result Value Ref Range Status   SARS Coronavirus 2 NEGATIVE NEGATIVE Final    Comment: (NOTE) SARS-CoV-2 target nucleic acids are NOT DETECTED.  The SARS-CoV-2 RNA is generally detectable in upper and lower respiratory specimens during the acute phase of infection. Negative results do not preclude SARS-CoV-2 infection, do not rule out co-infections with other pathogens, and should not be used as the sole basis for treatment or other patient management decisions. Negative results must be combined with clinical observations, patient history, and epidemiological information. The expected result is Negative.  Fact Sheet for Patients: SugarRoll.be  Fact Sheet for Healthcare Providers: https://www.woods-mathews.com/  This test is not yet approved or cleared by the [place] FDA and  has been authorized for detection and/or diagnosis of SARS-CoV-2 by FDA under an Emergency Use Authorization (EUA). This EUA will remain  in effect (meaning this test can be used) for the duration of the COVID-19 declaration under Se ction 564(b)(1) of the Act, 21 U.S.C. section 360bbb-3(b)(1), unless the authorization is terminated or revoked sooner.  Performed at [facility], [place] [address], [place], [place] [address]      Labs: BNP (last 3 results) Recent Labs    05/11/21 1502 05/24/21 0452  BNP 656.0* 818.5*   Basic Metabolic Panel: Recent Labs  Lab 05/18/21 0631 05/19/21 0427 05/20/21 0357 05/21/21 0423 05/24/21 0452  NA 134* 130* 130* 133* 135  K 4.8 4.5 4.1 3.7 4.0  CL 86* 87* 85* 90* 94*  CO2 36* 37* 36* 34* 33*  GLUCOSE  94 311* 163* 142* 79  BUN 27* 23 34* 37* 29*  CREATININE 0.74 0.78 0.70 0.65 0.54  CALCIUM 8.5* 8.6* 8.9 9.1 8.7*  MG 2.0 2.3 1.8  --  2.2   Liver Function Tests: No results for input(s): AST, ALT, ALKPHOS, BILITOT, PROT, ALBUMIN in the last 168 hours. No results for  input(s): LIPASE, AMYLASE in the last 168 hours. No results for input(s): AMMONIA in the last 168 hours. CBC: Recent Labs  Lab 05/18/21 0631 05/19/21 0427 05/20/21 0357 05/21/21 0423 05/24/21 0452  WBC 16.6* 18.6* 20.3* 14.5* 13.0*  NEUTROABS 13.7* 17.9* 18.9*  --   --   HGB 13.3 13.9 13.3 13.8 14.0  HCT 44.6 45.0 43.1 44.2 45.5  MCV 100.5* 98.7 97.7 96.7 98.5  PLT 241 232 276 306 309   Cardiac Enzymes: No results for input(s): CKTOTAL, CKMB, CKMBINDEX, TROPONINI in the last 168 hours. BNP: Invalid input(s): POCBNP CBG: Recent Labs  Lab 05/23/21 0717 05/23/21 1105 05/23/21 1613 05/23/21 2112 05/24/21 0711  GLUCAP 91 101* 168* 113* 88   D-Dimer No results for input(s): DDIMER in the last 72 hours. Hgb A1c No results for input(s): HGBA1C in the last 72 hours. Lipid Profile No results for input(s): CHOL, HDL, LDLCALC, TRIG, CHOLHDL, LDLDIRECT in the last 72 hours. Thyroid function studies No results for input(s): TSH, T4TOTAL, T3FREE, THYROIDAB in the last 72 hours.  Invalid input(s): FREET3 Anemia work up No results for input(s): VITAMINB12, FOLATE, FERRITIN, TIBC, IRON, RETICCTPCT in the last 72 hours. Urinalysis    Component Value Date/Time   COLORURINE YELLOW 12/11/2020 2018   APPEARANCEUR CLEAR 12/11/2020 2018   LABSPEC 1.019 12/11/2020 2018   PHURINE 5.0 12/11/2020 2018   GLUCOSEU NEGATIVE 12/11/2020 2018   HGBUR SMALL (A) 12/11/2020 2018   BILIRUBINUR NEGATIVE 12/11/2020 2018   KETONESUR 80 (A) 12/11/2020 2018   PROTEINUR 30 (A) 12/11/2020 2018   UROBILINOGEN 0.2 07/30/2013 1831   NITRITE NEGATIVE 12/11/2020 2018   LEUKOCYTESUR NEGATIVE 12/11/2020 2018   Sepsis Labs Invalid input(s): PROCALCITONIN,  WBC,  LACTICIDVEN Microbiology Recent Results (from the past 240 hour(s))  SARS CORONAVIRUS 2 (TAT 6-24 HRS) Nasopharyngeal Nasopharyngeal Swab     Status: None   Collection Time: 05/16/21  1:20 PM   Specimen: Nasopharyngeal Swab  Result Value Ref  Range Status   SARS Coronavirus 2 NEGATIVE NEGATIVE Final    Comment: (NOTE) SARS-CoV-2 target nucleic acids are NOT DETECTED.  The SARS-CoV-2 RNA is generally detectable in upper and lower respiratory specimens during the acute phase of infection. Negative results do not preclude SARS-CoV-2 infection, do not rule out co-infections with other pathogens, and should not be used as the sole basis for treatment or other patient management decisions. Negative results must be combined with clinical observations, patient history, and epidemiological information. The expected result is Negative.  Fact Sheet for Patients: SugarRoll.be  Fact Sheet for Healthcare Providers: https://www.woods-mathews.com/  This test is not yet approved or cleared by the [place] FDA and  has been authorized for detection and/or diagnosis of SARS-CoV-2 by FDA under an Emergency Use Authorization (EUA). This EUA will remain  in effect (meaning this test can be used) for the duration of the COVID-19 declaration under Se ction 564(b)(1) of the Act, 21 U.S.C. section 360bbb-3(b)(1), unless the authorization is terminated or revoked sooner.  Performed at [facility], [place] [address] [place], [place] [address]   SARS CORONAVIRUS 2 (TAT 6-24 HRS) Nasopharyngeal Nasopharyngeal Swab  Status: None   Collection Time: 05/22/21  4:35 PM   Specimen: Nasopharyngeal Swab  Result Value Ref Range Status   SARS Coronavirus 2 NEGATIVE NEGATIVE Final    Comment: (NOTE) SARS-CoV-2 target nucleic acids are NOT DETECTED.  The SARS-CoV-2 RNA is generally detectable in upper and lower respiratory specimens during the acute phase of infection. Negative results do not preclude SARS-CoV-2 infection, do not rule out co-infections with other pathogens, and should not be used as the sole basis for treatment or other patient management decisions. Negative results must be combined  with clinical observations, patient history, and epidemiological information. The expected result is Negative.  Fact Sheet for Patients: SugarRoll.be  Fact Sheet for Healthcare Providers: https://www.woods-mathews.com/  This test is not yet approved or cleared by the [place] FDA and  has been authorized for detection and/or diagnosis of SARS-CoV-2 by FDA under an Emergency Use Authorization (EUA). This EUA will remain  in effect (meaning this test can be used) for the duration of the COVID-19 declaration under Se ction 564(b)(1) of the Act, 21 U.S.C. section 360bbb-3(b)(1), unless the authorization is terminated or revoked sooner.  Performed at [facility], [place] [address], [place], [place] [address]      Time coordinating discharge:  I have spent 35 minutes face to face with the patient and on the ward discussing the patients care, assessment, plan and disposition with other care givers. >50% of the time was devoted counseling the patient about the risks and benefits of treatment/Discharge disposition and coordinating care.   SIGNED:   Damita Lack, MD  [facility] 05/24/2021, 10:21 AM   If 7PM-7AM, please contact night-coverage

## 2021-05-24 NOTE — Care Management Important Message (Signed)
Important Message  Patient Details  Name: Meagan Holt MRN: [id number] Date of Birth: 03-Mar-1941   Medicare Important Message Given:  Yes     Tommy Medal 05/24/2021, 11:46 AM

## 2021-05-24 NOTE — Progress Notes (Signed)
EMS here to get patient for transport to [place]. Report has been called to [place], Nurse, learning disability. Patient is stable and ready for transport.

## 2021-06-04 DEATH — deceased

## 2021-06-29 ENCOUNTER — Ambulatory Visit: Payer: Medicare Other | Admitting: Cardiology

## 2021-06-29 NOTE — Progress Notes (Deleted)
Cardiology Office Note:    Date:  06/29/2021   ID:  Verley, [place] 25-Aug-1941, MRN [id number]  PCP:  [name], MD   [facility] Providers Cardiologist:  Kate Sable, MD (Inactive)     Referring MD: [name], MD     History of Present Illness:    Meagan Holt is a 80 y.o. female here for the evaluation of atrial fibrillation at the request of Dr. Kathryne Eriksson.  Personally reviewed hospital records from discharge summary on 05/24/2021 where she was known to have permanent atrial fibrillation that was rate controlled on beta-blockers.  She was on Xarelto for anticoagulation.  She was admitted for severe COPD exacerbation and acute on chronic diastolic heart failure.  At home she is on approximately 4 L of oxygen.  He was given bronchodilators Augmentin and oral prednisone.  She was transition to as needed Lasix.  Volume status seem to be improved.  She was encouraged to stop smoking.  She was seen by pulmonary secondary to right upper lobe nodule and conservative management/approach was recommended for right upper lobe nodule in the setting of advanced COPD frailty.  She did experience some delirium while in the hospital.  This was thought to be secondary to steroids as well as withdrawal of Wellbutrin opiates and gabapentin.  [facility] the [facility]  According to goals of care note, she requested to be DNR.  She decided she would not want to pursue treatment for lung cancer if this proves to be a cancer.  Past Medical History:  Diagnosis Date   Arthritis    "all my joints" (07/30/2013)   Chronic back pain    Chronic bronchitis ([place])    "get it q year" (07/30/2013)   COPD (chronic obstructive pulmonary disease) ([place])    Depression    Falls frequently    "fell twice in the last 2 days" (07/30/2013)   GERD (gastroesophageal reflux disease)    "rarely" (07/30/2013)   H/O hiatal hernia    History of blood transfusion     Kidney stones    Stroke [facility])     Past Surgical History:  Procedure Laterality Date   APPENDECTOMY     BACK SURGERY     "put cages in mid back; did arthroscopic OR on lower back" (11/326/2014)   BILATERAL OOPHORECTOMY Bilateral    CATARACT EXTRACTION W/ INTRAOCULAR LENS  IMPLANT, BILATERAL Bilateral    CHOLECYSTECTOMY     EXCISIONAL HEMORRHOIDECTOMY     GASTRIC BYPASS     HERNIA REPAIR     "removed my belly button too" (07/30/2013)   KIDNEY STONE SURGERY     "twice" (07/30/2013)   KNEE ARTHROSCOPY Left    TONSILLECTOMY      Current Medications: No outpatient medications have been marked as taking for the 06/29/21 encounter (Appointment) with Jerline Pain, MD.     Allergies:   Latex and Doxycycline   Social History   Socioeconomic History   Marital status: Widowed    Spouse name: Not on file   Number of children: Not on file   Years of education: Not on file   Highest education level: Not on file  Occupational History   Not on file  Tobacco Use   Smoking status: Every Day    Packs/day: 1.00    Years: 42.00    Pack years: 42.00    Types: Cigarettes   Smokeless tobacco: Never  Substance and Sexual Activity  Alcohol use: Yes    Comment: rarely    Drug use: No   Sexual activity: Yes  Other Topics Concern   Not on file  Social History Narrative   Not on file   Social Determinants of Health   Financial Resource Strain: Not on file  Food Insecurity: Not on file  Transportation Needs: Not on file  Physical Activity: Not on file  Stress: Not on file  Social Connections: Not on file     Family History: The patient's ***family history includes Stomach cancer in her father.  ROS:   Please see the history of present illness.    *** All other systems reviewed and are negative.  EKGs/Labs/Other Studies Reviewed:    The following studies were reviewed today:  ECHO 05/19/2021:  1. Left ventricular ejection fraction, by estimation, is 60 to 65%. The  left  ventricle has normal function. The left ventricle has no regional  wall motion abnormalities. There is mild left ventricular hypertrophy.  Left ventricular diastolic parameters  are indeterminate.   2. RV-RA gradient 18 mmHg suggests normal RVSP. Right ventricular  systolic function is low normal. The right ventricular size is moderately  enlarged.   3. Left atrial size was severely dilated.   4. Right atrial size was moderately dilated.   5. There is a trivial pericardial effusion posterior to the left  ventricle.   6. The mitral valve is grossly normal. Mild mitral valve regurgitation.   7. The aortic valve is tricuspid. There is moderate calcification of the  aortic valve. Aortic valve regurgitation is not visualized. Mild to  moderate aortic valve sclerosis/calcification is present, without any  evidence of aortic stenosis. Aortic valve  mean gradient measures 6.0 mmHg.   8. Unable to estimate CVP.   Comparison(s): Prior images reviewed side by side. RV function similar to  prior study. Unable to estimate CVP.   CT scan of chest on 05/16/2021 personally reviewed shows atherosclerotic plaque of thoracic aorta as well as coronary artery calcifications in all vessels.  EKG:  EKG is *** ordered today.  The ekg ordered today demonstrates *** 05/18/2021 shows A. fib 78 bpm with occasional PVCs  Recent Labs: 05/11/2021: ALT 18 05/24/2021: B Natriuretic Peptide 172.0; BUN 29; Creatinine, Ser 0.54; Hemoglobin 14.0; Magnesium 2.2; Platelets 309; Potassium 4.0; Sodium 135  Recent Lipid Panel    Component Value Date/Time   CHOL 202 (H) 07/31/2013 0350   TRIG 304 (H) 07/31/2013 0350   HDL 47 07/31/2013 0350   CHOLHDL 4.3 07/31/2013 0350   VLDL 61 (H) 07/31/2013 0350   LDLCALC 94 07/31/2013 0350     Risk Assessment/Calculations:   {Does this patient have ATRIAL FIBRILLATION?:[id number]}       Physical Exam:    VS:  There were no vitals taken for this visit.    Wt Readings from  Last 3 Encounters:  05/16/21 172 lb 13.5 oz (78.4 kg)  12/11/20 197 lb 15.6 oz (89.8 kg)  10/26/20 198 lb (89.8 kg)     GEN: *** Well nourished, well developed in no acute distress HEENT: Normal NECK: No JVD; No carotid bruits LYMPHATICS: No lymphadenopathy CARDIAC: ***RRR, no murmurs, rubs, gallops RESPIRATORY:  Clear to auscultation without rales, wheezing or rhonchi  ABDOMEN: Soft, non-tender, non-distended MUSCULOSKELETAL:  No edema; No deformity  SKIN: Warm and dry NEUROLOGIC:  Alert and oriented x 3 PSYCHIATRIC:  Normal affect   ASSESSMENT:    No diagnosis found. PLAN:    In order of problems  listed above:  ***   {Are you ordering a CV Procedure (e.g. stress test, cath, DCCV, TEE, etc)?   Press F2        :[id number]}    Medication Adjustments/Labs and Tests Ordered: Current medicines are reviewed at length with the patient today.  Concerns regarding medicines are outlined above.  No orders of the defined types were placed in this encounter.  No orders of the defined types were placed in this encounter.   There are no Patient Instructions on file for this visit.   Signed, Candee Furbish, MD  06/29/2021 9:57 AM    [facility]

## 2022-11-02 ENCOUNTER — Encounter: Payer: Self-pay | Admitting: Radiology
